# Patient Record
Sex: Female | Born: 1960 | Race: Black or African American | Hispanic: No | Smoking: Never smoker
Health system: Southern US, Community
[De-identification: ages and names within clinical notes are randomized; demographics above are authoritative.]

## PROBLEM LIST (undated history)

## (undated) DIAGNOSIS — H269 Unspecified cataract: Secondary | ICD-10-CM

## (undated) DIAGNOSIS — H35039 Hypertensive retinopathy, unspecified eye: Secondary | ICD-10-CM

## (undated) DIAGNOSIS — E119 Type 2 diabetes mellitus without complications: Secondary | ICD-10-CM

## (undated) HISTORY — DX: Unspecified cataract: H26.9

## (undated) HISTORY — DX: Hypertensive retinopathy, unspecified eye: H35.039

## (undated) HISTORY — DX: Type 2 diabetes mellitus without complications: E11.9

---

## 1998-11-14 ENCOUNTER — Other Ambulatory Visit: Admission: RE | Admit: 1998-11-14 | Discharge: 1998-11-14 | Payer: Self-pay | Admitting: Obstetrics and Gynecology

## 2000-01-02 ENCOUNTER — Other Ambulatory Visit: Admission: RE | Admit: 2000-01-02 | Discharge: 2000-01-02 | Payer: Self-pay | Admitting: Obstetrics and Gynecology

## 2001-02-16 ENCOUNTER — Other Ambulatory Visit: Admission: RE | Admit: 2001-02-16 | Discharge: 2001-02-16 | Payer: Self-pay | Admitting: Obstetrics and Gynecology

## 2001-05-27 HISTORY — PX: LAPAROSCOPIC TUBAL LIGATION: SUR803

## 2002-06-16 ENCOUNTER — Other Ambulatory Visit: Admission: RE | Admit: 2002-06-16 | Discharge: 2002-06-16 | Payer: Self-pay | Admitting: Obstetrics and Gynecology

## 2002-06-30 ENCOUNTER — Encounter: Payer: Self-pay | Admitting: Obstetrics and Gynecology

## 2002-06-30 ENCOUNTER — Encounter: Admission: RE | Admit: 2002-06-30 | Discharge: 2002-06-30 | Payer: Self-pay | Admitting: Obstetrics and Gynecology

## 2004-04-23 ENCOUNTER — Other Ambulatory Visit: Admission: RE | Admit: 2004-04-23 | Discharge: 2004-04-23 | Payer: Self-pay | Admitting: Obstetrics and Gynecology

## 2004-07-23 ENCOUNTER — Encounter: Admission: RE | Admit: 2004-07-23 | Discharge: 2004-07-23 | Payer: Self-pay | Admitting: Obstetrics and Gynecology

## 2004-08-17 ENCOUNTER — Encounter (INDEPENDENT_AMBULATORY_CARE_PROVIDER_SITE_OTHER): Payer: Self-pay | Admitting: *Deleted

## 2004-08-17 ENCOUNTER — Ambulatory Visit (HOSPITAL_COMMUNITY): Admission: RE | Admit: 2004-08-17 | Discharge: 2004-08-17 | Payer: Self-pay | Admitting: Obstetrics and Gynecology

## 2005-05-03 ENCOUNTER — Ambulatory Visit (HOSPITAL_COMMUNITY): Admission: RE | Admit: 2005-05-03 | Discharge: 2005-05-03 | Payer: Self-pay | Admitting: Gastroenterology

## 2005-05-06 ENCOUNTER — Ambulatory Visit (HOSPITAL_COMMUNITY): Admission: RE | Admit: 2005-05-06 | Discharge: 2005-05-06 | Payer: Self-pay | Admitting: Gastroenterology

## 2006-05-13 ENCOUNTER — Encounter (INDEPENDENT_AMBULATORY_CARE_PROVIDER_SITE_OTHER): Payer: Self-pay | Admitting: *Deleted

## 2006-05-13 ENCOUNTER — Ambulatory Visit (HOSPITAL_COMMUNITY): Admission: RE | Admit: 2006-05-13 | Discharge: 2006-05-13 | Payer: Self-pay | Admitting: Obstetrics and Gynecology

## 2006-07-14 ENCOUNTER — Encounter: Admission: RE | Admit: 2006-07-14 | Discharge: 2006-07-14 | Payer: Self-pay | Admitting: Obstetrics and Gynecology

## 2008-02-11 ENCOUNTER — Encounter: Admission: RE | Admit: 2008-02-11 | Discharge: 2008-02-11 | Payer: Self-pay | Admitting: Obstetrics and Gynecology

## 2010-06-16 ENCOUNTER — Encounter: Payer: Self-pay | Admitting: Gastroenterology

## 2010-06-16 ENCOUNTER — Encounter: Payer: Self-pay | Admitting: Obstetrics and Gynecology

## 2010-10-12 NOTE — H&P (Signed)
NAMELINEA, CALLES               ACCOUNT NO.:  [id number]   MEDICAL RECORD NO.:  [id number]          PATIENT TYPE:  AMB   LOCATION:  SDC                           FACILITY:  [facility]   PHYSICIAN:  Duke Salvia. Marcelle Overlie, M.D.DATE OF BIRTH:  1960/11/04   DATE OF ADMISSION:  DATE OF DISCHARGE:                              HISTORY & PHYSICAL   CHIEF COMPLAINT:  Request sterilization, menorrhagia.   HISTORY OF PRESENT ILLNESS:  A 50 year old G2, P2, underwent a SHG  recently that showed a small intramural fibroids and an endometrial  polyp.  She has had complaints of menorrhagia for several years and  presents now requesting sterilization, and D&C hysteroscopy with  ThermaChoice CMA.  This procedure including risk of bleeding, infection,  adjacent organ injury, the possible need for open or additional surgery,  all reviewed with her.  The permanence of the procedure, failure rate 2-  3 per thousand, all discussed which she understands and accepts.   Dictation Canceled At [employer].      Richard M. Marcelle Overlie, M.D.     RMH/MEDQ  D:  05/09/2006  T:  05/09/2006  Job:  [id number]

## 2010-10-12 NOTE — H&P (Signed)
Jacqueline Greer, Jacqueline Greer               ACCOUNT NO.:  [id number]   MEDICAL RECORD NO.:  [id number]          PATIENT TYPE:  AMB   LOCATION:  SDC                           [facility]ILITY:  [facility]   PHYSICIAN:  Duke Salvia. Marcelle Overlie, M.D.DATE OF BIRTH:  06/29/1960   DATE OF ADMISSION:  05/13/2006  DATE OF DISCHARGE:                              HISTORY & PHYSICAL   CHIEF COMPLAINT:  For permanent sterilization, heavy bleeding.   HISTORY OF PRESENT ILLNESS:  A 50 year old G2, P2 currently using  condoms for contraception was evaluated recently for menorrhagia, this  showed a well-defined endometrial polyp, also several small intramural  fibroids 1.1, 3.2, 1.9 and 1.1 cm with a 1.4 cm well-defined endometrial  polyp.  She presents now for tubal ligation and D&C hysteroscopy with  ThermaChoice EMA.  This procedure including the permanence of the tubal  procedure, failure rate of 2-3 per 1000, other risks related to  bleeding, infection, complications that may require open additional  surgery all discussed with her which she understands and accepts.   PAST MEDICAL HISTORY:   ALLERGIES:  NONE.   CURRENT MEDICATIONS:  None.   FAMILY HISTORY:  Family history significant for mother with diabetes.   SURGICAL HISTORY:  She has had a prior cesarean and one vaginal  delivery.   PHYSICAL EXAMINATION:  VITAL SIGNS:  Temperature 98.2, blood pressure  98/60.  HEENT:  Unremarkable.  NECK:  Supple without masses.  LUNGS:  Clear.  CARDIOVASCULAR:  Regular rate and rhythm without murmurs, rubs or  gallops noted.  BREASTS:  Without masses.  ABDOMEN:  Soft, flat, nontender.  PELVIC EXAM:  Normal external genitalia.  Vagina and cervix clear.  Uterus midpositional size.  Adnexa negative.  EXTREMITIES/NEUROLOGIC:  Exam unremarkable.   IMPRESSION:  1. Request sterilization.  2. Menorrhagia with endometrial polyp and small leiomyoma.   PLAN:  Filshie clip tubal ligation, D&C hysteroscopy with ThermaChoice  EMA  procedure and risks reviewed as above.      Richard M. Marcelle Overlie, M.D.  Electronically Signed     RMH/MEDQ  D:  04/26/2006  T:  04/26/2006  Job:  [id number]

## 2010-10-12 NOTE — Op Note (Signed)
NAMENEGIN, HEGG               ACCOUNT NO.:  [id number]   MEDICAL RECORD NO.:  [id number]          PATIENT TYPE:  AMB   LOCATION:  SDC                           [facility]ILITY[facility]  [facility]   PHYSICIAN:  Duke Salvia. Marcelle Overlie, M.D.DATE OF BIRTH:  1960-11-21   DATE OF PROCEDURE:  05/13/2006  DATE OF DISCHARGE:                               OPERATIVE REPORT   PREOPERATIVE DIAGNOSIS:  Request permanent sterilization, abnormal  uterine bleeding secondary to leiomyoma.   POSTOPERATIVE DIAGNOSIS:  Request permanent sterilization, abnormal  uterine bleeding secondary to leiomyoma.   PROCEDURE:  1. Laparoscopic tubal ligation by Filshie clip application.  2. D&C hysteroscopy.  3. ThermaChoice endometrial ablation.   SURGEON:  Dr. Marcelle Overlie.   ANESTHESIA:  General endotracheal.   COMPLICATIONS:  None.   SPECIMENS REMOVED:  Endometrial curettings.   PROCEDURE AND FINDINGS:  The patient was taken to the operating room.  After an adequate level of general endotracheal anesthesia was obtained  with the patient legs stirrups, the abdomen, perineum and vagina were  prepped and draped in usual manner for laparoscopy D&C. The bladder was  drained.  EUA carried out.  Uterus was upper limit of normal size,  slightly irregular.  Adnexa negative.  A Hulka tenaculum was positioned.  Attention directed to the abdomen.  The subumbilical incision was made  after infiltrating with half percent plain Marcaine.  Veress needle  introduced without difficulty.  Its intra-abdominal position verified by  pressure and water testing.  After a 3-liter pneumoperitoneum was then  created, laparoscopic trocar and sleeve were then introduced without  difficulty.  There was no evidence any bleeding or trauma.  The patient  was then placed in Trendelenburg.  The pelvic findings were unremarkable  except for some small irregularities in the uterus consistent with known  leiomyoma.  Half percent Marcaine 4 to 5 mL were then  dripped across  each tube from the cornu to the fimbriated end.  Filshie clip was then  applied at a right angle, 2 cm from the cornu.  After positively  identifying the tube, it was traced to the fimbriated end and applied 2  cm from the cornu with excellent application.  This was repeated on the  opposite side, again after carefully identifying the tube.  These areas  were hemostatic.  Instruments were removed.  Gas was allowed to escape.  The defect closed with 4-0 Dexon and subcuticular suture.  The legs were  extended.  Paracervical block was then created by infiltrating at 3 and  9 o'clock submucosally with 5 to 7 mL of 1% Xylocaine on either side  after negative aspiration.  Uterus sounded to 9 cm, progressively  dilated to a 27 Pratt.  D&C was then carried out.  Minimal tissue sent  to pathology.  Hysteroscope was then inserted.  The cavity was irrigated  and noted be  unremarkable except for the appearance of a submucosal portion of  fibroid at the fundus on the posterior wall.  Most of this appeared to  be intramural.  ThermaChoice EMA was then carried out per treatment  protocol without complication.  She tolerated this well and went to  recovery room in good condition.      Richard M. Marcelle Overlie, M.D.  Electronically Signed     RMH/MEDQ  D:  05/13/2006  T:  05/13/2006  Job:  [id number]

## 2010-10-12 NOTE — H&P (Signed)
Jacqueline Greer, Jacqueline Greer               ACCOUNT NO.:  [id number]   MEDICAL RECORD NO.:  [id number]          PATIENT TYPE:  AMB   LOCATION:  SDC                           [facility]ILITY:  [facility]   PHYSICIAN:  Duke Salvia. Marcelle Overlie, M.D.DATE OF BIRTH:  Feb 03, 1961   DATE OF ADMISSION:  08/17/2004  DATE OF DISCHARGE:                                HISTORY & PHYSICAL   CHIEF COMPLAINT:  Menorrhagia.   HISTORY OF PRESENT ILLNESS:  [age]-year-old G2, P2, not currently  sexually active, was previously on OCPs, but has not been on those recently.  Evaluated by our nurse practitioner in November of '05 with complaints of  menorrhagia and anemia, was started on iron at that point and [facility] was  scheduled.   SHG in our office, July 09, 2004, showed several fibroids, 1.2 and 9 mm,  several smaller, and on saline infusion, what appeared to be a polyp or a  small submucous fibroid was noted.  She presents at this time for [facility] and  hysteroscopy.  This procedure including risks of bleeding, infection, other  complications such as perforation that may require additional surgery are  all reviewed with her, which she understands and accepts.  Pap, March 28, 2004, was normal.   PAST MEDICAL HISTORY:   ALLERGIES:  None.   OPERATIONS:  Cesarean section in '89 followed by VBAC, not currently  sexually active.   CURRENT MEDICATIONS:  Iron.   FAMILY HISTORY:  Family history is significant for a history of diabetes.   PRENATAL LABORATORY DATA:  Blood type is O-positive.   PHYSICAL EXAM:  VITAL SIGNS:  Temperature 98.2, blood pressure 124/80.  HEENT:  Unremarkable.  NECK:  Neck supple without masses.  LUNGS:  Clear.  CARDIOVASCULAR:  Regular rate and rhythm without murmurs, rubs, or gallops  noted.  BREASTS:  Breasts without masses.  ABDOMEN:  Abdomen soft, flat and nontender.  PELVIC:  Normal external genitalia.  Vagina and cervix are clear.  Uterus is  upper limit of normal size, mobile.  Adnexa  negative.  EXTREMITIES:  Unremarkable.  NEUROLOGIC:  Unremarkable.   IMPRESSION:  1.  Menorrhagia with mild anemia.  2.  Endometrial polyp versus submucous fibroid.   PLAN:  D&C/hysteroscopy, procedure and risks reviewed as above.      RMH/MEDQ  D:  08/14/2004  T:  08/14/2004  Job:  [id number]

## 2010-10-12 NOTE — Op Note (Signed)
Jacqueline Greer, Jacqueline Greer               ACCOUNT NO.:  [id number]   MEDICAL RECORD NO.:  [id number]          PATIENT TYPE:  AMB   LOCATION:  SDC                           [facility]ILITY:  [facility]   PHYSICIAN:  Duke Salvia. Marcelle Overlie, M.D.DATE OF BIRTH:  01/19/1961   DATE OF PROCEDURE:  08/17/2004  DATE OF DISCHARGE:                                 OPERATIVE REPORT   PREOPERATIVE DIAGNOSES:  1.  Abnormal uterine bleeding.  2.  Endometrial polyp versus submucous fibroid.   POSTOPERATIVE DIAGNOSES:  1.  Submucous fibroid.  2.  Abnormal uterine bleeding.   PROCEDURE:  Dilatation and curettage, hysteroscopy, removal of a submucous  fibroid.   SURGEON:  Duke Salvia. Marcelle Overlie, M.D.   ANESTHESIA:  Monitored sedation plus paracervical block.   ESTIMATED BLOOD LOSS:  5-10 mL.   COMPLICATIONS:  None.   PROCEDURE AND FINDINGS:  The patient was taken to the operating room.  After  an adequate sedation was obtained with the patient's legs in stirrups, the  perineum and vagina were prepped and draped with Betadine, the bladder was  drained, a UA carried out.  The uterus midposition, normal size, adnexa  negative.  The speculum was positioned, cervix grasped with a tenaculum.  A  paracervical block was created by infiltrating at 3 and 9 o'clock  submucosally.  Xylocaine 1% 5-7 mL on either side after negative aspiration.  The uterus was then sounded to 9 cm, was progressively dilated to a 27-29  Pratt dilator.  A 7 mm continuous-flow hysteroscope was inserted.  A large  amount of tissue was noted, so initial D&C was carried out and sent to  pathology.  After this was completed, the scope was reinserted, better  visualization was noted.  What appeared to be a fundal submucous fibroid was  noted.  A polyp forceps was then used to grasp the fibroid, and it was  avulsed.  When it was removed in toto, it appeared to be 1.5-2 cm across.  After this was removed, there was minimal bleeding and the scope was  reinserted.  The cavity was clean.  This was observed over time with reduced  pressure.  No bleeding was noted.  The instruments were removed and she went  to the recovery room in good condition.  She did receive Toradol IV in the  operating room.      RMH/MEDQ  D:  08/17/2004  T:  08/17/2004  Job:  [id number]

## 2011-04-10 ENCOUNTER — Encounter: Payer: Self-pay | Admitting: Emergency Medicine

## 2011-04-10 ENCOUNTER — Emergency Department (INDEPENDENT_AMBULATORY_CARE_PROVIDER_SITE_OTHER): Payer: 59

## 2011-04-10 ENCOUNTER — Emergency Department (HOSPITAL_COMMUNITY)
Admission: EM | Admit: 2011-04-10 | Discharge: 2011-04-10 | Disposition: A | Discharge status: Home / Self Care | Payer: 59 | Source: Home / Self Care | Attending: Family Medicine | Admitting: Family Medicine

## 2011-04-10 DIAGNOSIS — K5900 Constipation, unspecified: Secondary | ICD-10-CM

## 2011-04-10 LAB — POCT URINALYSIS DIP (DEVICE)
Glucose, UA: NEGATIVE mg/dL
Urobilinogen, UA: 1 mg/dL (ref 0.0–1.0)

## 2011-04-10 MED ORDER — ONDANSETRON HCL 4 MG PO TABS: 4.0000 mg | ORAL_TABLET | Freq: Three times a day (TID) | ORAL | Status: AC | PRN

## 2011-04-10 NOTE — ED Notes (Signed)
Pt here with poss constipation and sx mid abd cramping pain radiating to left lower back.pt reports  to sx starting on Monday.pt last bm last Wednesday and x 1 episode of vomiting last night.no fevers associated with sx

## 2011-04-10 NOTE — ED Provider Notes (Signed)
History     CSN: [id number] Arrival date & time: 04/10/2011  9:00 AM   First MD Initiated Contact with Patient 04/10/11 1028      Chief Complaint  Patient presents with  . Constipation  . Abdominal Cramping    (Consider location/radiation/quality/duration/timing/severity/associated sxs/prior treatment) Patient is a 50 y.o. female presenting with abdominal pain. The history is provided by the patient.  Abdominal Pain The primary symptoms of the illness include abdominal pain, fever, nausea and vomiting. The primary symptoms of the illness do not include diarrhea. The current episode started more than 2 days ago. The onset of the illness was sudden. The problem has not changed since onset. The abdominal pain began more than 2 days ago. The pain came on suddenly. The abdominal pain has been unchanged since its onset. The abdominal pain is generalized. The abdominal pain radiates to the left flank.  Nausea began 3 to 5 days ago.  The vomiting began more than 2 days ago. Vomiting occurred once. The emesis contains stomach contents.  Additional symptoms associated with the illness include chills, constipation and back pain.    History reviewed. No pertinent past medical history.  History reviewed. No pertinent past surgical history.  Family History  Problem Relation Age of Onset  . Diabetes Other     History  Substance Use Topics  . Smoking status: Never Smoker   . Smokeless tobacco: Not on file  . Alcohol Use: No    OB History    Grav Para Term Preterm Abortions TAB SAB Ect Mult Living                  Review of Systems  Constitutional: Positive for fever and chills.  HENT: Negative.   Eyes: Negative.   Respiratory: Negative.   Gastrointestinal: Positive for nausea, vomiting, abdominal pain and constipation. Negative for diarrhea.  Genitourinary: Negative.   Musculoskeletal: Positive for back pain.  Skin: Negative.     Allergies  Review of patient's allergies  indicates not on file.  Home Medications  No current outpatient prescriptions on file.  BP 142/89  Pulse 86  Temp(Src) 98.7 F (37.1 C) (Oral)  Resp 22  SpO2 100%  Physical Exam  Constitutional: She is oriented to person, place, and time. She appears well-developed and well-nourished.  HENT:  Head: Normocephalic and atraumatic.  Eyes: EOM are normal.  Neck: Normal range of motion.  Pulmonary/Chest: Breath sounds normal.  Abdominal: Soft. Normal appearance and bowel sounds are normal. There is tenderness in the right upper quadrant, right lower quadrant, epigastric area, periumbilical area and left upper quadrant. There is no rebound, no tenderness at McBurney's point and negative Murphy's sign.       Reports 5/10 pain diffusely  Neurological: She is alert and oriented to person, place, and time.  Skin: Skin is warm and dry.    ED Course  Procedures (including critical care time)  Labs Reviewed - No data to display No results found.   No diagnosis found.    MDM  KUB: moderate amount of feces        Richardo Priest, MD 04/10/11 1135

## 2011-04-11 ENCOUNTER — Observation Stay (HOSPITAL_COMMUNITY)
Admission: EM | Admit: 2011-04-11 | Discharge: 2011-04-12 | Disposition: A | Discharge status: Home / Self Care | Payer: 59 | Attending: Emergency Medicine | Admitting: Emergency Medicine

## 2011-04-11 ENCOUNTER — Encounter (HOSPITAL_COMMUNITY): Payer: Self-pay | Admitting: Emergency Medicine

## 2011-04-11 DIAGNOSIS — N390 Urinary tract infection, site not specified: Secondary | ICD-10-CM

## 2011-04-11 DIAGNOSIS — R1013 Epigastric pain: Secondary | ICD-10-CM | POA: Insufficient documentation

## 2011-04-11 DIAGNOSIS — R1110 Vomiting, unspecified: Secondary | ICD-10-CM | POA: Insufficient documentation

## 2011-04-11 DIAGNOSIS — R1011 Right upper quadrant pain: Principal | ICD-10-CM | POA: Insufficient documentation

## 2011-04-11 LAB — HEPATIC FUNCTION PANEL
Albumin: 4.3 g/dL (ref 3.5–5.2)
Alkaline Phosphatase: 79 U/L (ref 39–117)
Indirect Bilirubin: 0.9 mg/dL (ref 0.3–0.9)
Total Bilirubin: 1.2 mg/dL (ref 0.3–1.2)
Total Protein: 8.4 g/dL — ABNORMAL HIGH (ref 6.0–8.3)

## 2011-04-11 LAB — BASIC METABOLIC PANEL
CO2: 29 mEq/L (ref 19–32)
Calcium: 10.3 mg/dL (ref 8.4–10.5)
Creatinine, Ser: 0.62 mg/dL (ref 0.50–1.10)
GFR calc Af Amer: >90 mL/min (ref >90)
GFR calc non Af Amer: >90 mL/min (ref >90)
Glucose, Bld: 133 mg/dL — ABNORMAL HIGH (ref 70–99)
Sodium: 141 mEq/L (ref 135–145)

## 2011-04-11 LAB — URINALYSIS, ROUTINE W REFLEX MICROSCOPIC
Ketones, ur: >80 mg/dL — AB
Protein, ur: 30 mg/dL — AB
pH: 5 (ref 5.0–8.0)

## 2011-04-11 LAB — CBC
HCT: 40.7 % (ref 36.0–46.0)
MCH: 32.3 pg (ref 26.0–34.0)
MCHC: 33.7 g/dL (ref 30.0–36.0)
MCV: 96 fL (ref 78.0–100.0)
RBC: 4.24 MIL/uL (ref 3.87–5.11)
RDW: 12.9 % (ref 11.5–15.5)

## 2011-04-11 LAB — DIFFERENTIAL
Basophils Relative: 0 % (ref 0–1)
Lymphs Abs: 1 10*3/uL (ref 0.7–4.0)
Monocytes Relative: 5 % (ref 3–12)
Neutro Abs: 8.5 10*3/uL — ABNORMAL HIGH (ref 1.7–7.7)
Neutrophils Relative %: 85 % — ABNORMAL HIGH (ref 43–77)

## 2011-04-11 LAB — URINE MICROSCOPIC-ADD ON

## 2011-04-11 LAB — LIPASE, BLOOD: Lipase: 34 U/L (ref 11–59)

## 2011-04-11 MED ORDER — SODIUM CHLORIDE 0.9 % IV SOLN
Freq: Once | INTRAVENOUS | Status: AC
  Administered 2011-04-11 – 2011-04-12 (×2): via INTRAVENOUS

## 2011-04-11 MED ORDER — SODIUM CHLORIDE 0.9 % IV BOLUS (SEPSIS)
500.0000 mL | Freq: Once | INTRAVENOUS | Status: AC
  Administered 2011-04-11: 1000 mL via INTRAVENOUS

## 2011-04-11 MED ORDER — ONDANSETRON HCL 4 MG/2ML IJ SOLN
4.0000 mg | Freq: Once | INTRAMUSCULAR | Status: AC
  Administered 2011-04-11: 4 mg via INTRAVENOUS
  Filled 2011-04-11: qty 2

## 2011-04-11 MED ORDER — MORPHINE SULFATE 4 MG/ML IJ SOLN
4.0000 mg | Freq: Once | INTRAMUSCULAR | Status: AC
  Administered 2011-04-11: 4 mg via INTRAVENOUS
  Filled 2011-04-11: qty 1

## 2011-04-11 NOTE — ED Provider Notes (Signed)
History     CSN: [id number] Arrival date & time: 04/11/2011  8:42 PM   First MD Initiated Contact with Patient 04/11/11 2243      Chief Complaint  Patient presents with  . Emesis    (Consider location/radiation/quality/duration/timing/severity/associated sxs/prior treatment) Patient is a 50 y.o. female presenting with vomiting. The history is provided by the patient.  Emesis  The current episode started more than 2 days ago. The problem occurs continuously. The problem has been gradually worsening. The emesis has an appearance of stomach contents. Associated symptoms include abdominal pain, chills and myalgias. Pertinent negatives include no cough, no diarrhea and no URI.  Pt states she has had abdominal pain for last 4 days. Went to UC yesterday, states was told she was constipated. Pt went home with nausea medications and maalox. States since then has been vomiting. Unable to keep anything down. Denies diarrhea, last bm today, normal. No hx of the same.   History reviewed. No pertinent past medical history.  History reviewed. No pertinent past surgical history.  Family History  Problem Relation Age of Onset  . Diabetes Other     History  Substance Use Topics  . Smoking status: Never Smoker   . Smokeless tobacco: Not on file  . Alcohol Use: No    OB History    Grav Para Term Preterm Abortions TAB SAB Ect Mult Living                  Review of Systems  Constitutional: Positive for chills.  Respiratory: Negative for cough.   Gastrointestinal: Positive for vomiting and abdominal pain. Negative for diarrhea.  Musculoskeletal: Positive for myalgias.    Allergies  Review of patient's allergies indicates no known allergies.  Home Medications   Current Outpatient Rx  Name Route Sig Dispense Refill  . ONDANSETRON HCL 4 MG PO TABS Oral Take 1 tablet (4 mg total) by mouth every 8 (eight) hours as needed for nausea. 15 tablet 0    BP 131/78  Pulse 86  Resp 18  SpO2  100%  Physical Exam  Constitutional: She is oriented to person, place, and time. She appears well-developed and well-nourished. No distress.  HENT:  Head: Normocephalic.  Cardiovascular: Normal rate, regular rhythm and normal heart sounds.   Pulmonary/Chest: Effort normal and breath sounds normal. No respiratory distress.  Abdominal: Soft. Bowel sounds are normal.       Right upper quadrant, epigastric, and right lower quadrant tenderness. No guarding, no rebound tenderness.  Genitourinary:       No CVA tenderness  Musculoskeletal: Normal range of motion.  Neurological: She is alert and oriented to person, place, and time.  Skin: Skin is warm and dry.  Psychiatric: She has a normal mood and affect.    ED Course  Procedures (including critical care time)  Labs Reviewed  DIFFERENTIAL - Abnormal; Notable for the following:    Neutrophils Relative 85 (*)    Neutro Abs 8.5 (*)    Lymphocytes Relative 10 (*)    All other components within normal limits  BASIC METABOLIC PANEL - Abnormal; Notable for the following:    Glucose, Bld 133 (*)    All other components within normal limits  CBC  URINALYSIS, ROUTINE W REFLEX MICROSCOPIC  CBC  COMPREHENSIVE METABOLIC PANEL  LIPASE, BLOOD   Dg Abd 1 View  04/10/2011  *RADIOLOGY REPORT*  Clinical Data: Generalized abdominal pain for 3 days, vomiting  ABDOMEN - 1 VIEW  Comparison: Abdomen film of  05/04/1999 seen  Findings: There is a moderate amount of feces throughout the colon. No bowel obstruction is seen.  No opaque calculi noted.  Tubal ligation clips are present in the pelvis.  IMPRESSION: Moderate amount of feces throughout the colon.  No bowel obstruction.  Original Report Authenticated By: Juline Patch, M.D.     12:03 AM Elevated LFTs. Pain improved with iv morphine, nausea improved with iv zofran. Pt continues to have abdominal tenderness. Will get CT abd/pelvis for further evaluation   Pt signed out to PA paz at the end of the  shift, CT abd/pelvis pending. Pain under control. MDM          Lottie Mussel, PA 04/16/11 0120

## 2011-04-11 NOTE — ED Notes (Signed)
Pt medicated per order and provided with a pillow and blanket.

## 2011-04-11 NOTE — ED Notes (Signed)
PT. REPORTS INTERMITTENT VOMITTING  X 4 DAYS , DENIES DIARRHEA , GENERALIZED WEAKNESS " TIRED/DRAINED" ,  NO FEVER OR CHILLS.

## 2011-04-12 ENCOUNTER — Other Ambulatory Visit: Payer: Self-pay

## 2011-04-12 ENCOUNTER — Emergency Department (HOSPITAL_COMMUNITY): Payer: 59

## 2011-04-12 MED ORDER — ONDANSETRON HCL 4 MG/2ML IJ SOLN
4.0000 mg | Freq: Once | INTRAMUSCULAR | Status: AC
  Administered 2011-04-12: 4 mg via INTRAVENOUS

## 2011-04-12 MED ORDER — OXYCODONE-ACETAMINOPHEN 5-325 MG PO TABS: 1.0000 | ORAL_TABLET | Freq: Four times a day (QID) | ORAL | Status: AC | PRN

## 2011-04-12 MED ORDER — MORPHINE SULFATE 4 MG/ML IJ SOLN
4.0000 mg | Freq: Once | INTRAMUSCULAR | Status: AC
  Administered 2011-04-12: 4 mg via INTRAVENOUS

## 2011-04-12 MED ORDER — MORPHINE SULFATE 4 MG/ML IJ SOLN
4.0000 mg | Freq: Once | INTRAMUSCULAR | Status: AC
  Administered 2011-04-12: 4 mg via INTRAVENOUS
  Filled 2011-04-12: qty 1

## 2011-04-12 MED ORDER — OXYCODONE-ACETAMINOPHEN 5-325 MG PO TABS
2.0000 | ORAL_TABLET | Freq: Once | ORAL | Status: AC
  Administered 2011-04-12: 2 via ORAL
  Filled 2011-04-12: qty 2

## 2011-04-12 MED ORDER — NITROFURANTOIN MONOHYD MACRO 100 MG PO CAPS: 100.0000 mg | ORAL_CAPSULE | Freq: Two times a day (BID) | ORAL | Status: AC

## 2011-04-12 MED ORDER — SODIUM CHLORIDE 0.9 % IV BOLUS (SEPSIS)
1000.0000 mL | Freq: Once | INTRAVENOUS | Status: AC
  Administered 2011-04-12: 1000 mL via INTRAVENOUS

## 2011-04-12 MED ORDER — MORPHINE SULFATE 4 MG/ML IJ SOLN
INTRAMUSCULAR | Status: AC
  Filled 2011-04-12: qty 1

## 2011-04-12 MED ORDER — ONDANSETRON HCL 4 MG/2ML IJ SOLN
4.0000 mg | Freq: Once | INTRAMUSCULAR | Status: AC
  Administered 2011-04-12: 4 mg via INTRAVENOUS
  Filled 2011-04-12: qty 2

## 2011-04-12 MED ORDER — ONDANSETRON 4 MG PO TBDP
8.0000 mg | ORAL_TABLET | Freq: Once | ORAL | Status: AC
  Administered 2011-04-12: 8 mg via ORAL
  Filled 2011-04-12: qty 2

## 2011-04-12 MED ORDER — ONDANSETRON 8 MG PO TBDP: 8.0000 mg | ORAL_TABLET | Freq: Three times a day (TID) | ORAL | Status: AC | PRN

## 2011-04-12 MED ORDER — ONDANSETRON HCL 4 MG/2ML IJ SOLN
INTRAMUSCULAR | Status: AC
  Filled 2011-04-12: qty 2

## 2011-04-12 MED ORDER — IOHEXOL 300 MG/ML  SOLN
100.0000 mL | Freq: Once | INTRAMUSCULAR | Status: AC | PRN
  Administered 2011-04-12: 100 mL via INTRAVENOUS

## 2011-04-12 NOTE — ED Notes (Signed)
Pt resting quietly with eyes closed, resp equal and nonlabored.  NAD

## 2011-04-12 NOTE — ED Notes (Signed)
Pt found actively vomitting and c/o pain getting worse.  PA notified for new orders.

## 2011-04-12 NOTE — ED Notes (Signed)
Report received from [facility], [facility] fund[facility].  Pt in room, no pain at this time, updated on POC (waiting for ultrasound).

## 2011-04-12 NOTE — ED Provider Notes (Signed)
History     CSN: [id number] Arrival date & time: 04/11/2011  8:42 PM   First MD Initiated Contact with Patient 04/11/11 2243      Chief Complaint  Patient presents with  . Emesis    (Consider location/radiation/quality/duration/timing/severity/associated sxs/prior treatment) HPI  History reviewed. No pertinent past medical history.  History reviewed. No pertinent past surgical history.  Family History  Problem Relation Age of Onset  . Diabetes Other     History  Substance Use Topics  . Smoking status: Never Smoker   . Smokeless tobacco: Not on file  . Alcohol Use: No    OB History    Grav Para Term Preterm Abortions TAB SAB Ect Mult Living                  Review of Systems  Allergies  Review of patient's allergies indicates no known allergies.  Home Medications   Current Outpatient Rx  Name Route Sig Dispense Refill  . ONDANSETRON HCL 4 MG PO TABS Oral Take 1 tablet (4 mg total) by mouth every 8 (eight) hours as needed for nausea. 15 tablet 0    BP 121/68  Pulse 83  Resp 17  Ht 5\' 7"  (1.702 m)  Wt 180 lb (81.647 kg)  BMI 28.19 kg/m2  SpO2 98%  Physical Exam  ED Course  Procedures (including critical care time)  Labs Reviewed  DIFFERENTIAL - Abnormal; Notable for the following:    Neutrophils Relative 85 (*)    Neutro Abs 8.5 (*)    Lymphocytes Relative 10 (*)    All other components within normal limits  BASIC METABOLIC PANEL - Abnormal; Notable for the following:    Glucose, Bld 133 (*)    All other components within normal limits  URINALYSIS, ROUTINE W REFLEX MICROSCOPIC - Abnormal; Notable for the following:    Color, Urine ORANGE (*) BIOCHEMICALS MAY BE AFFECTED BY COLOR   Appearance CLOUDY (*)    Hgb urine dipstick MODERATE (*)    Bilirubin Urine MODERATE (*)    Ketones, ur >80 (*)    Protein, ur 30 (*)    Nitrite POSITIVE (*)    All other components within normal limits  HEPATIC FUNCTION PANEL - Abnormal; Notable for the  following:    Total Protein 8.4 (*)    AST 58 (*)    ALT 79 (*)    All other components within normal limits  URINE MICROSCOPIC-ADD ON - Abnormal; Notable for the following:    Squamous Epithelial / LPF FEW (*)    All other components within normal limits  CBC  LIPASE, BLOOD   Dg Abd 1 View  04/10/2011  *RADIOLOGY REPORT*  Clinical Data: Generalized abdominal pain for 3 days, vomiting  ABDOMEN - 1 VIEW  Comparison: Abdomen film of 05/04/1999 seen  Findings: There is a moderate amount of feces throughout the colon. No bowel obstruction is seen.  No opaque calculi noted.  Tubal ligation clips are present in the pelvis.  IMPRESSION: Moderate amount of feces throughout the colon.  No bowel obstruction.  Original Report Authenticated By: Juline Patch, M.D.   US Abdomen Complete  04/12/2011  *RADIOLOGY REPORT*  Clinical Data:  Right upper quadrant abdominal pain and emesis.  ABDOMINAL ULTRASOUND COMPLETE  Comparison:  CT of the abdomen and pelvis performed earlier today at 03:52 a.m., and abdominal ultrasound performed 05/03/2005  Findings:  Gallbladder:  The gallbladder is normal in appearance, without evidence for gallstones, gallbladder wall thickening or  pericholecystic fluid.  No ultrasonographic Murphy's sign is elicited.  Common Bile Duct:  0.4 cm in diameter; within normal limits in caliber.  Liver:  Diffusely increased hepatic echogenicity and coarsened echotexture, compatible with fatty infiltration; a small amount of sparing is noted adjacent to the gallbladder fossa.  The hepatic cysts characterized on CT are visualized on ultrasound, measuring 0.9 cm in size adjacent to the gallbladder fossa, and 0.8 cm at the left hepatic lobe.  Limited Doppler evaluation demonstrates normal blood flow within the liver.  IVC:  Unremarkable in appearance.  Pancreas:  Although the pancreas is difficult to visualize in its entirety due to overlying bowel gas, no focal pancreatic abnormality is identified.   Spleen:  8.4 cm in length; within normal limits in size and echotexture.  Right kidney:  11.3 cm in length; normal in size, configuration and parenchymal echogenicity.  No evidence of hydronephrosis.  A 0.9 cm cyst is seen at the interpole region of the right kidney, as previously noted.  Left kidney:  10.7 cm in length; normal in size, configuration and parenchymal echogenicity.  No evidence of mass or hydronephrosis.  Abdominal Aorta:  Normal in caliber; no aneurysm identified.  IMPRESSION:  1.  No acute abnormalities identified within the abdomen. 2.  Diffuse fatty infiltration within the liver. 3.  Small hepatic and right renal cysts again noted.  Original Report Authenticated By: Tonia Ghent, M.D.   Ct Abdomen Pelvis W Contrast  04/12/2011  *RADIOLOGY REPORT*  Clinical Data: Abdominal pain and vomiting; generalized weakness.  CT ABDOMEN AND PELVIS WITH CONTRAST  Technique:  Multidetector CT imaging of the abdomen and pelvis was performed following the standard protocol during bolus administration of intravenous contrast.  Contrast: OMNIPAQUE IOHEXOL 300 MG/ML IV SOLN  Comparison: Abdominal ultrasound performed 05/03/2005, and abdominal radiograph performed 04/10/2011  Findings: The visualized lung bases are clear.  Small hypodensities are noted within both hepatic lobes, measuring up to 1.1 cm in size and likely reflecting small hepatic cyst.  The liver is otherwise unremarkable in appearance.  The spleen is mildly diminutive but unremarkable.  The gallbladder demonstrates a mildly unusual configuration but remains within normal limits.  The pancreas and adrenal glands are unremarkable.  A small 6 mm hypodensity along the periphery of the right kidney appears to reflect a stable cyst, on correlation with prior ultrasound.  Additional smaller cysts are noted bilaterally.  There is no evidence of hydronephrosis.  No renal or ureteral stones are seen.  No significant perinephric stranding is  appreciated.  No free fluid is identified.  The small bowel is unremarkable in appearance.  The stomach is within normal limits.  No acute vascular abnormalities are seen.  The appendix is normal in caliber, without evidence for appendicitis.  Contrast progresses to the level of the mid transverse colon.  The colon is partially filled with stool, and is unremarkable in appearance.  The bladder is mildly distended and within normal limits.  A small calcification at the uterine fundus may reflect a small calcified fibroid.  The patient is status post bilateral tubal ligation.  The ovaries are grossly symmetric in appearance; no suspicious adnexal masses are seen.  No inguinal lymphadenopathy is seen.  No acute osseous abnormalities are identified.  IMPRESSION:  1.  No acute abnormalities identified within the abdomen or pelvis. 2.  Small hepatic and renal cysts again seen. 3.  Calcification at the fundus of the uterus may reflect a small calcified fibroid.  Original Report Authenticated By:  JEFFREY Cherly Hensen, M.D.     No diagnosis found.    MDM  Report received from 3rd shift PA .  Patient awaiting u/s of the abdomen for RUQ and epigastric pain. Report given to [facility]        Jethro Bastos, NP 04/12/11 [phone]

## 2011-04-12 NOTE — ED Provider Notes (Signed)
Medical screening examination/treatment/procedure(s) were performed by non-physician practitioner and as supervising physician I was immediately available for consultation/collaboration.   Aariv Medlock M Dymphna Wadley, DO 04/12/11 2135 

## 2011-04-12 NOTE — ED Provider Notes (Signed)
History     CSN: [id number] Arrival date & time: 04/11/2011  8:42 PM   First MD Initiated Contact with Patient 04/11/11 2243      Chief Complaint  Patient presents with  . Emesis    (Consider location/radiation/quality/duration/timing/severity/associated sxs/prior treatment) HPI  History reviewed. No pertinent past medical history.  History reviewed. No pertinent past surgical history.  Family History  Problem Relation Age of Onset  . Diabetes Other     History  Substance Use Topics  . Smoking status: Never Smoker   . Smokeless tobacco: Not on file  . Alcohol Use: No    OB History    Grav Para Term Preterm Abortions TAB SAB Ect Mult Living                  Review of Systems  Allergies  Review of patient's allergies indicates no known allergies.  Home Medications   Current Outpatient Rx  Name Route Sig Dispense Refill  . ONDANSETRON HCL 4 MG PO TABS Oral Take 1 tablet (4 mg total) by mouth every 8 (eight) hours as needed for nausea. 15 tablet 0    BP 131/70  Pulse 80  Resp 20  Ht 5\' 7"  (1.702 m)  Wt 180 lb (81.647 kg)  BMI 28.19 kg/m2  SpO2 100%  Physical Exam  ED Course  Procedures (including critical care time)  Labs Reviewed  DIFFERENTIAL - Abnormal; Notable for the following:    Neutrophils Relative 85 (*)    Neutro Abs 8.5 (*)    Lymphocytes Relative 10 (*)    All other components within normal limits  BASIC METABOLIC PANEL - Abnormal; Notable for the following:    Glucose, Bld 133 (*)    All other components within normal limits  URINALYSIS, ROUTINE W REFLEX MICROSCOPIC - Abnormal; Notable for the following:    Color, Urine ORANGE (*) BIOCHEMICALS MAY BE AFFECTED BY COLOR   Appearance CLOUDY (*)    Hgb urine dipstick MODERATE (*)    Bilirubin Urine MODERATE (*)    Ketones, ur >80 (*)    Protein, ur 30 (*)    Nitrite POSITIVE (*)    All other components within normal limits  HEPATIC FUNCTION PANEL - Abnormal; Notable for the  following:    Total Protein 8.4 (*)    AST 58 (*)    ALT 79 (*)    All other components within normal limits  URINE MICROSCOPIC-ADD ON - Abnormal; Notable for the following:    Squamous Epithelial / LPF FEW (*)    All other components within normal limits  CBC  LIPASE, BLOOD  URINE CULTURE   Dg Abd 1 View  04/10/2011  *RADIOLOGY REPORT*  Clinical Data: Generalized abdominal pain for 3 days, vomiting  ABDOMEN - 1 VIEW  Comparison: Abdomen film of 05/04/1999 seen  Findings: There is a moderate amount of feces throughout the colon. No bowel obstruction is seen.  No opaque calculi noted.  Tubal ligation clips are present in the pelvis.  IMPRESSION: Moderate amount of feces throughout the colon.  No bowel obstruction.  Original Report Authenticated By: Juline Patch, M.D.   US Abdomen Complete  04/12/2011  *RADIOLOGY REPORT*  Clinical Data:  Right upper quadrant abdominal pain and emesis.  ABDOMINAL ULTRASOUND COMPLETE  Comparison:  CT of the abdomen and pelvis performed earlier today at 03:52 a.m., and abdominal ultrasound performed 05/03/2005  Findings:  Gallbladder:  The gallbladder is normal in appearance, without evidence for gallstones, gallbladder  wall thickening or pericholecystic fluid.  No ultrasonographic Murphy's sign is elicited.  Common Bile Duct:  0.4 cm in diameter; within normal limits in caliber.  Liver:  Diffusely increased hepatic echogenicity and coarsened echotexture, compatible with fatty infiltration; a small amount of sparing is noted adjacent to the gallbladder fossa.  The hepatic cysts characterized on CT are visualized on ultrasound, measuring 0.9 cm in size adjacent to the gallbladder fossa, and 0.8 cm at the left hepatic lobe.  Limited Doppler evaluation demonstrates normal blood flow within the liver.  IVC:  Unremarkable in appearance.  Pancreas:  Although the pancreas is difficult to visualize in its entirety due to overlying bowel gas, no focal pancreatic abnormality is  identified.  Spleen:  8.4 cm in length; within normal limits in size and echotexture.  Right kidney:  11.3 cm in length; normal in size, configuration and parenchymal echogenicity.  No evidence of hydronephrosis.  A 0.9 cm cyst is seen at the interpole region of the right kidney, as previously noted.  Left kidney:  10.7 cm in length; normal in size, configuration and parenchymal echogenicity.  No evidence of mass or hydronephrosis.  Abdominal Aorta:  Normal in caliber; no aneurysm identified.  IMPRESSION:  1.  No acute abnormalities identified within the abdomen. 2.  Diffuse fatty infiltration within the liver. 3.  Small hepatic and right renal cysts again noted.  Original Report Authenticated By: Tonia Ghent, M.D.   Ct Abdomen Pelvis W Contrast  04/12/2011  *RADIOLOGY REPORT*  Clinical Data: Abdominal pain and vomiting; generalized weakness.  CT ABDOMEN AND PELVIS WITH CONTRAST  Technique:  Multidetector CT imaging of the abdomen and pelvis was performed following the standard protocol during bolus administration of intravenous contrast.  Contrast: OMNIPAQUE IOHEXOL 300 MG/ML IV SOLN  Comparison: Abdominal ultrasound performed 05/03/2005, and abdominal radiograph performed 04/10/2011  Findings: The visualized lung bases are clear.  Small hypodensities are noted within both hepatic lobes, measuring up to 1.1 cm in size and likely reflecting small hepatic cyst.  The liver is otherwise unremarkable in appearance.  The spleen is mildly diminutive but unremarkable.  The gallbladder demonstrates a mildly unusual configuration but remains within normal limits.  The pancreas and adrenal glands are unremarkable.  A small 6 mm hypodensity along the periphery of the right kidney appears to reflect a stable cyst, on correlation with prior ultrasound.  Additional smaller cysts are noted bilaterally.  There is no evidence of hydronephrosis.  No renal or ureteral stones are seen.  No significant perinephric stranding  is appreciated.  No free fluid is identified.  The small bowel is unremarkable in appearance.  The stomach is within normal limits.  No acute vascular abnormalities are seen.  The appendix is normal in caliber, without evidence for appendicitis.  Contrast progresses to the level of the mid transverse colon.  The colon is partially filled with stool, and is unremarkable in appearance.  The bladder is mildly distended and within normal limits.  A small calcification at the uterine fundus may reflect a small calcified fibroid.  The patient is status post bilateral tubal ligation.  The ovaries are grossly symmetric in appearance; no suspicious adnexal masses are seen.  No inguinal lymphadenopathy is seen.  No acute osseous abnormalities are identified.  IMPRESSION:  1.  No acute abnormalities identified within the abdomen or pelvis. 2.  Small hepatic and renal cysts again seen. 3.  Calcification at the fundus of the uterus may reflect a small calcified fibroid.  Original  Report Authenticated By: Tonia Ghent, M.D.     1. Abdominal pain    8:20 AM Hand off from Remi Haggard NP.  Patient seen and examined. Patient with intermittent vomiting for several days. She has had a negative CT and unconcerning labs overnight.  She was moved to the CDU pending an abdominal ultrasound which has resulted and is negative. Will attempt to transition to by mouth pain medicine. She has been sipping on liquids in the room without vomiting.  Patient was improved in the emergency department. She is tolerating PO's. Her pain is controlled. She is stable for discharge home. Patient was discharged home with medicine for pain, nausea, and urinary tract infection. She was urged to return with worsening symptoms, fever, persistent vomiting, bloody stools, or any other concerns. Patient verbalizes understanding and agrees with plan.  Patient counseled on use of narcotic pain medications. Counseled not to combine these medications with  others containing tylenol. Urged not to drink alcohol, drive, or perform any other activities that requires focus while taking these medications. The patient verbalizes understanding and agrees with the plan.   MDM  Patient with abdominal pain of unclear etiology. Blood test, CT scan, ultrasound are not suggestive of any acute or dangerous conditions. Patient is tolerating fluids in the emergency department. Her pain is controlled. She is stable for discharge home and primary care followup.        Eustace Moore [place], [place] 04/12/11 1659

## 2011-04-12 NOTE — ED Notes (Signed)
pt c/o mild nausea and pain at 3/10. VSS

## 2011-04-12 NOTE — ED Notes (Signed)
Pt finished with PO contrast, denies N/V at this time.  Lungs CTA, VSS

## 2011-04-12 NOTE — ED Notes (Signed)
Pt report given and care endorsed to Kelly Moon, RN. 

## 2011-04-13 NOTE — ED Provider Notes (Signed)
Medical screening examination/treatment/procedure(s) were performed by non-physician practitioner and as supervising physician I was immediately available for consultation/collaboration.  Jasmine Awe, MD 04/13/11 [phone]

## 2011-04-16 ENCOUNTER — Encounter: Payer: Self-pay | Admitting: Gastroenterology

## 2011-04-16 NOTE — ED Provider Notes (Signed)
Medical screening examination/treatment/procedure(s) were performed by non-physician practitioner and as supervising physician I was immediately available for consultation/collaboration.  Jasmine Awe, MD 04/16/11 [phone]

## 2011-05-01 ENCOUNTER — Encounter: Payer: Self-pay | Admitting: Gastroenterology

## 2011-05-01 ENCOUNTER — Ambulatory Visit (INDEPENDENT_AMBULATORY_CARE_PROVIDER_SITE_OTHER): Payer: 59 | Admitting: Gastroenterology

## 2011-05-01 ENCOUNTER — Other Ambulatory Visit (INDEPENDENT_AMBULATORY_CARE_PROVIDER_SITE_OTHER): Payer: 59

## 2011-05-01 VITALS — BP 128/88 | HR 82 | Ht 67.0 in | Wt 187.4 lb

## 2011-05-01 DIAGNOSIS — K219 Gastro-esophageal reflux disease without esophagitis: Secondary | ICD-10-CM

## 2011-05-01 DIAGNOSIS — R197 Diarrhea, unspecified: Secondary | ICD-10-CM

## 2011-05-01 DIAGNOSIS — R7989 Other specified abnormal findings of blood chemistry: Secondary | ICD-10-CM

## 2011-05-01 LAB — COMPREHENSIVE METABOLIC PANEL
ALT: 19 U/L (ref 0–35)
Albumin: 3.8 g/dL (ref 3.5–5.2)
CO2: 28 mEq/L (ref 19–32)
Calcium: 9.4 mg/dL (ref 8.4–10.5)
Chloride: 106 mEq/L (ref 96–112)
Creatinine, Ser: 0.6 mg/dL (ref 0.4–1.2)
GFR: 143.01 mL/min (ref >60.00)
Potassium: 4.2 mEq/L (ref 3.5–5.1)
Total Protein: 7.3 g/dL (ref 6.0–8.3)

## 2011-05-01 LAB — CBC WITH DIFFERENTIAL/PLATELET
Basophils Absolute: 0 10*3/uL (ref 0.0–0.1)
Hemoglobin: 12.1 g/dL (ref 12.0–15.0)
Lymphocytes Relative: 32.9 % (ref 12.0–46.0)
Monocytes Relative: 6.5 % (ref 3.0–12.0)
Neutro Abs: 2.9 10*3/uL (ref 1.4–7.7)
RBC: 3.69 Mil/uL — ABNORMAL LOW (ref 3.87–5.11)
RDW: 13.8 % (ref 11.5–14.6)

## 2011-05-01 MED ORDER — PEG-KCL-NACL-NASULF-NA ASC-C 100 G PO SOLR: 1.0000 | ORAL | Status: DC

## 2011-05-01 NOTE — Patient Instructions (Addendum)
You will be set up for a colonoscopy. You will be set up for an upper endoscopy. Try OTC prilosec or prevacid (or generic equivalent). You will have labs checked today in the basement lab.  Please head down after you check out with the front desk  (cbc, cmet).

## 2011-05-01 NOTE — Progress Notes (Signed)
Addended by: Richardson Chiquito on: 05/01/2011 12:07 PM   Modules accepted: Orders

## 2011-05-01 NOTE — Progress Notes (Signed)
HPI: This is a  very pleasant 50 year old woman whom I am meeting for the first time today  Feeling fine today.  Was having epigastric, bloating pain.  Dyspepsia.  Finally took some antiacids and vomited.  This was last month.  Eventually went to urgent care, was told she was constipated, given maalox.  Vomited.  Went back to [facility] to ER.  Given fluids and iv pain meds.  Had CT, [place], labs.  This was all about 2 weeks long.  For the past 1-2 weeks has been feeling better.  . The rest of her labs were essentially normal. Her urinalysis was slightly abnormal.  Took NSAIDs last week, but has not needed it this week.    She feels better and better.   No sick contacts  Her bowels are normal.  Had no overt GI bleeding.  May have had colonoscopy a LONG time ago.   Review of systems: Pertinent positive and negative review of systems were noted in the above HPI section. Complete review of systems was performed and was otherwise normal.    History reviewed. No pertinent past medical history.  History reviewed. No pertinent past surgical history.  No current outpatient prescriptions on file.    Allergies as of 05/01/2011  . (No Known Allergies)    Family History  Problem Relation Age of Onset  . Diabetes Mother     History   Social History  . Marital Status: Divorced    Spouse Name: N/A    Number of Children: 2  . Years of Education: N/A   Occupational History  .  [employer]   Social History Main Topics  . Smoking status: Never Smoker   . Smokeless tobacco: Never Used  . Alcohol Use: No  . Drug Use: No  . Sexually Active: Not on file   Other Topics Concern  . Not on file   Social History Narrative  . No narrative on file       Physical Exam: BP 128/88  Pulse 82  Ht 5\' 7"  (1.702 m)  Wt 187 lb 6.4 oz (85.004 kg)  BMI 29.35 kg/m2  SpO2 99% Constitutional: generally well-appearing Psychiatric: alert and oriented x3 Eyes: extraocular movements  intact Mouth: oral pharynx moist, no lesions Neck: supple no lymphadenopathy Cardiovascular: heart regular rate and rhythm Lungs: clear to auscultation bilaterally Abdomen: soft, nontender, nondistended, no obvious ascites, no peritoneal signs, normal bowel sounds Extremities: no lower extremity edema bilaterally Skin: no lesions on visible extremities    Assessment and plan: 50 y.o. female with  chronic GERD, recent nausea vomiting, diarrheal event  I suspect her recent vomiting, diarrheal event was infectious. She is feeling better and better and is really back to normal now. She has never had colon cancer screening with colonoscopy and we will arrange that to be done at her soonest convenience. She carries around H2 blocker in her purse and takes these several times a week. This has been going on for years and we will proceed with EGD at the same time to evaluate her for chronic GERD complications such as Barrett's esophagus. I am going to repeat her blood work given her slightly elevated liver tests a month ago.

## 2011-05-10 ENCOUNTER — Telehealth: Payer: Self-pay

## 2011-05-10 ENCOUNTER — Encounter: Payer: Self-pay | Admitting: Gastroenterology

## 2011-05-10 ENCOUNTER — Ambulatory Visit (AMBULATORY_SURGERY_CENTER): Payer: 59 | Admitting: Gastroenterology

## 2011-05-10 DIAGNOSIS — K2980 Duodenitis without bleeding: Secondary | ICD-10-CM

## 2011-05-10 DIAGNOSIS — K219 Gastro-esophageal reflux disease without esophagitis: Secondary | ICD-10-CM

## 2011-05-10 DIAGNOSIS — K449 Diaphragmatic hernia without obstruction or gangrene: Secondary | ICD-10-CM

## 2011-05-10 DIAGNOSIS — R197 Diarrhea, unspecified: Secondary | ICD-10-CM

## 2011-05-10 DIAGNOSIS — K269 Duodenal ulcer, unspecified as acute or chronic, without hemorrhage or perforation: Secondary | ICD-10-CM

## 2011-05-10 DIAGNOSIS — R1110 Vomiting, unspecified: Secondary | ICD-10-CM

## 2011-05-10 MED ORDER — SODIUM CHLORIDE 0.9 % IV SOLN: 500.0000 mL | INTRAVENOUS | Status: DC

## 2011-05-10 NOTE — Op Note (Signed)
[facility] [address] [place], [place]  [address]  COLONOSCOPY PROCEDURE REPORT  PATIENT:  Delynn, Olvera  MR#:  [id number] BIRTHDATE:  10-04-1960, 50 yrs. old  GENDER:  female ENDOSCOPIST:  Rachael Fee, MD PROCEDURE DATE:  05/10/2011 PROCEDURE:  Colonoscopy 47425, Incomplete colonoscopy ASA CLASS:  Class II INDICATIONS:  Routine Risk Screening MEDICATIONS:   Fentanyl 75 mcg IV, These medications were titrated to patient response per physician's verbal order, Versed 9 mg IV  DESCRIPTION OF PROCEDURE:   After the risks benefits and alternatives of the procedure were thoroughly explained, informed consent was obtained.  Digital rectal exam was performed and revealed no rectal masses.   The LB [id number]-[id number] endoscope was introduced through the anus and advanced to the hepatic flexure, limited by a redundant colon.    The quality of the prep was good.  The instrument was then slowly withdrawn as the colon was fully examined. <<PROCEDUREIMAGES>> FINDINGS:  The hepatic flexure, transverse, splenic flexure, descending, sigmoid colon, and rectum appeared unremarkable (see image1 and image2).   The colon was very long and redundant and despite abdominal pressure in several locations as well as changing patient position, I was unable to pass the colonoscope proximal to the hepatic flexure.  Retroflexed views in the rectum revealed no abnormalities. COMPLICATIONS:  None  ENDOSCOPIC IMPRESSION: 1) Normal visualized colon (INCOMPLETE EXAM), up to the hepatic flexure.  RECOMMENDATIONS: Dr. Christella Hartigan' office will arrange a Barium Enema to screen the unseen portion of your colon today (ascending colon, cecum).  ______________________________ Rachael Fee, MD  n. eSIGNED:   Rachael Fee at 05/10/2011 02:59 PM  Cherie Ouch, [phone]

## 2011-05-10 NOTE — Op Note (Signed)
[facility] [address] [place], [place]  [address]  ENDOSCOPY PROCEDURE REPORT  PATIENT:  Jacqueline, Greer  MR#:  [id number] BIRTHDATE:  February 07, 1961, 50 yrs. old  GENDER:  female ENDOSCOPIST:  Rachael Fee, MD PROCEDURE DATE:  05/10/2011 PROCEDURE:  EGD with biopsy, 84[address] ASA CLASS:  Class II INDICATIONS:  chronic GERD, recent vomiting illness MEDICATIONS:   There was residual sedation effect present from prior procedure., Fentanyl 25 mcg IV, These medications were titrated to patient response per physician's verbal order, Versed 1 mg IV TOPICAL ANESTHETIC:  none  DESCRIPTION OF PROCEDURE:   After the risks benefits and alternatives of the procedure were thoroughly explained, informed consent was obtained.  The LB GIF-H180 [id number] endoscope was introduced through the mouth and advanced to the second portion of the duodenum, without limitations.  The instrument was slowly withdrawn as the mucosa was fully examined. <<PROCEDUREIMAGES>> There was a 1cm clean based duodenal bulb ulcer. The surrounding mucosa was inflamed, edematous but not clearly neoplastic however several biopsies were taken (see image6 and image8).  A hiatal hernia was found (see image2). The distal esophagus was tortuous. Otherwise the examination was normal (see image3, image4, and image7).    Retroflexed views revealed no abnormalities.    The scope was then withdrawn from the patient and the procedure completed. COMPLICATIONS:  None ENDOSCOPIC IMPRESSION: 1) Duodenal bulb ulcer, clean based. This did not appear neoplastic but was biopsied 2) Hiatal hernia 3) Otherwise normal examination  RECOMMENDATIONS: Please start OTC prilosec, prevacid once daily. If you take NSAID type pain medicines, you should try to stop for the next 4-6 weeks. Await final biopsies. Dr. Christella Hartigan office will call to set up return visit in 4-6 weeks.   ______________________________ Rachael Fee,  MD  n. eSIGNED:   Rachael Fee at 05/10/2011 03:12 PM  Cherie Ouch, [phone]

## 2011-05-10 NOTE — Patient Instructions (Signed)
Dr Christella Hartigan office will call and set up a barium enema and also a office visit for 4-6 weeks. Please start over the counter prilosec or prevacid. If you take NSAIDS (medicines containing aspirin) please hold those for next 4-6 weeks. You may use tylenol if you need something for pain.  Please follow all discharge instructions given to you by the recovery room nurse. If you have any questions or problems after discharge please call [phone]. You will receive a phone call in the am to see how you are doing and answer any questions you may have. Thank you for choosing [facility] for your health care needs.

## 2011-05-10 NOTE — Progress Notes (Signed)
Patient did not experience any of the following events: a burn prior to discharge; a fall within the facility; wrong site/side/patient/procedure/implant event; or a hospital transfer or hospital admission upon discharge from the facility. ([id number]) Patient did not have preoperative order for IV antibiotic SSI prophylaxis. ([id number])  

## 2011-05-10 NOTE — Telephone Encounter (Signed)
Pt scheduled for BE and ROV   BE is scheduled for 05/16/11 arrive 1015 am for a 1030 am appt.  Pt is to be NPO after midnight and needs to pick up the prep solution at [facility] or [facility]UE   ROV is scheduled for 06/04/11 1115 am Call pt on Monday with appts

## 2011-05-13 ENCOUNTER — Telehealth: Payer: Self-pay | Admitting: *Deleted

## 2011-05-13 NOTE — Telephone Encounter (Signed)

## 2011-05-13 NOTE — Telephone Encounter (Signed)
Pt aware and has been given all the instructions and appt times

## 2011-05-16 ENCOUNTER — Ambulatory Visit (HOSPITAL_COMMUNITY)
Admission: RE | Admit: 2011-05-16 | Discharge: 2011-05-16 | Disposition: A | Discharge status: Home / Self Care | Payer: 59 | Source: Ambulatory Visit | Attending: Gastroenterology | Admitting: Gastroenterology

## 2011-05-16 DIAGNOSIS — R933 Abnormal findings on diagnostic imaging of other parts of digestive tract: Secondary | ICD-10-CM | POA: Insufficient documentation

## 2011-05-16 DIAGNOSIS — R197 Diarrhea, unspecified: Secondary | ICD-10-CM | POA: Insufficient documentation

## 2011-05-20 ENCOUNTER — Telehealth: Payer: Self-pay | Admitting: *Deleted

## 2011-05-20 NOTE — Telephone Encounter (Signed)
Discussed with patient her biopsy results Patient has a follow up appointment for 1-8 Will discuss further with patient  In the office

## 2011-05-20 NOTE — Telephone Encounter (Signed)
Message copied by Marlowe Kays on Mon May 20, 2011  9:36 AM ------      Message from: Rachael Fee      Created: Fri May 17, 2011 12:59 PM             Patty,      Please call the patient.  Biopsies showed inflammation in duodenum. She needs to stay on  PPI once daily, needs rov with me in 4-5 weeks.            Natalie, no result note is needed.

## 2011-06-04 ENCOUNTER — Encounter: Payer: Self-pay | Admitting: Gastroenterology

## 2011-06-04 ENCOUNTER — Ambulatory Visit (INDEPENDENT_AMBULATORY_CARE_PROVIDER_SITE_OTHER): Payer: 59 | Admitting: Gastroenterology

## 2011-06-04 VITALS — BP 122/84 | HR 80 | Ht 67.0 in | Wt 189.2 lb

## 2011-06-04 DIAGNOSIS — R933 Abnormal findings on diagnostic imaging of other parts of digestive tract: Secondary | ICD-10-CM

## 2011-06-04 NOTE — Patient Instructions (Addendum)
Stay on prilosec once daily for another 4 weeks, then back down to every other day.  Call Dr. Christella Hartigan in 8 weeks to report on your symptoms. You will be set up for a colonoscopy at [facility] with propofol to check for the polyps noted on your recent barium enema.   Pt will call when she is ready to schedule her colon.

## 2011-06-04 NOTE — Progress Notes (Signed)
Review of pertinent gastrointestinal problems: 1. routine risk for colon cancer, colonoscopy December 2012 was incomplete due to tortuous colon. Followup barium enema suggested polyps in the ascending colon 2. dyspepsia, duodenal ulceration, EGD December 2012.    Unclear etiology but her symptoms improved with proton pump inhibitor once daily   HPI: This is a  very pleasant 51 year old woman whom I last saw her for upper and lower' endoscopy about a month ago.  She feels well, no pains, no nausea, no vomiting.  Still on once dialy PPI.     History reviewed. No pertinent past medical history.  Past Surgical History  Procedure Date  . Laparoscopic tubal ligation 2003    Current Outpatient Prescriptions  Medication Sig Dispense Refill  . omeprazole (PRILOSEC) 20 MG capsule Take 20 mg by mouth daily.          Allergies as of 06/04/2011  . (No Known Allergies)    Family History  Problem Relation Age of Onset  . Diabetes Mother     History   Social History  . Marital Status: Divorced    Spouse Name: N/A    Number of Children: 2  . Years of Education: N/A   Occupational History  .  [employer]   Social History Main Topics  . Smoking status: Never Smoker   . Smokeless tobacco: Never Used  . Alcohol Use: No  . Drug Use: No  . Sexually Active: Not on file   Other Topics Concern  . Not on file   Social History Narrative  . No narrative on file      Physical Exam: BP 122/84  Pulse 80  Ht 5\' 7"  (1.702 m)  Wt 189 lb 3.2 oz (85.821 kg)  BMI 29.63 kg/m2  SpO2 97% Constitutional: generally well-appearing Psychiatric: alert and oriented x3 Abdomen: soft, nontender, nondistended, no obvious ascites, no peritoneal signs, normal bowel sounds     Assessment and plan: 51 y.o. female with abnormal barium enema, recent duodenal ulceration  She will continue on proton pump inhibitor once daily for another month and then will decrease to every other day and will  call to report on her symptoms. We reviewed the fact that she had an incomplete colonoscopy last month but followup barium enema suggested polyps in her right colon. I recommended repeat colonoscopy with different sedation, different colonoscope. We will schedule her for colonoscopy at [facility] long with propofol at her soonest convenience.

## 2011-06-24 NOTE — Progress Notes (Signed)
Observation review is complete. 

## 2012-05-20 IMAGING — CR DG ABDOMEN 1V
1 series · 1 of 1 positions shown · non-contrast
Comparison: Abdomen film of 05/04/1999 seen

CLINICAL DATA: Generalized abdominal pain for 3 days, vomiting

ABDOMEN - 1 VIEW

[view not recorded]
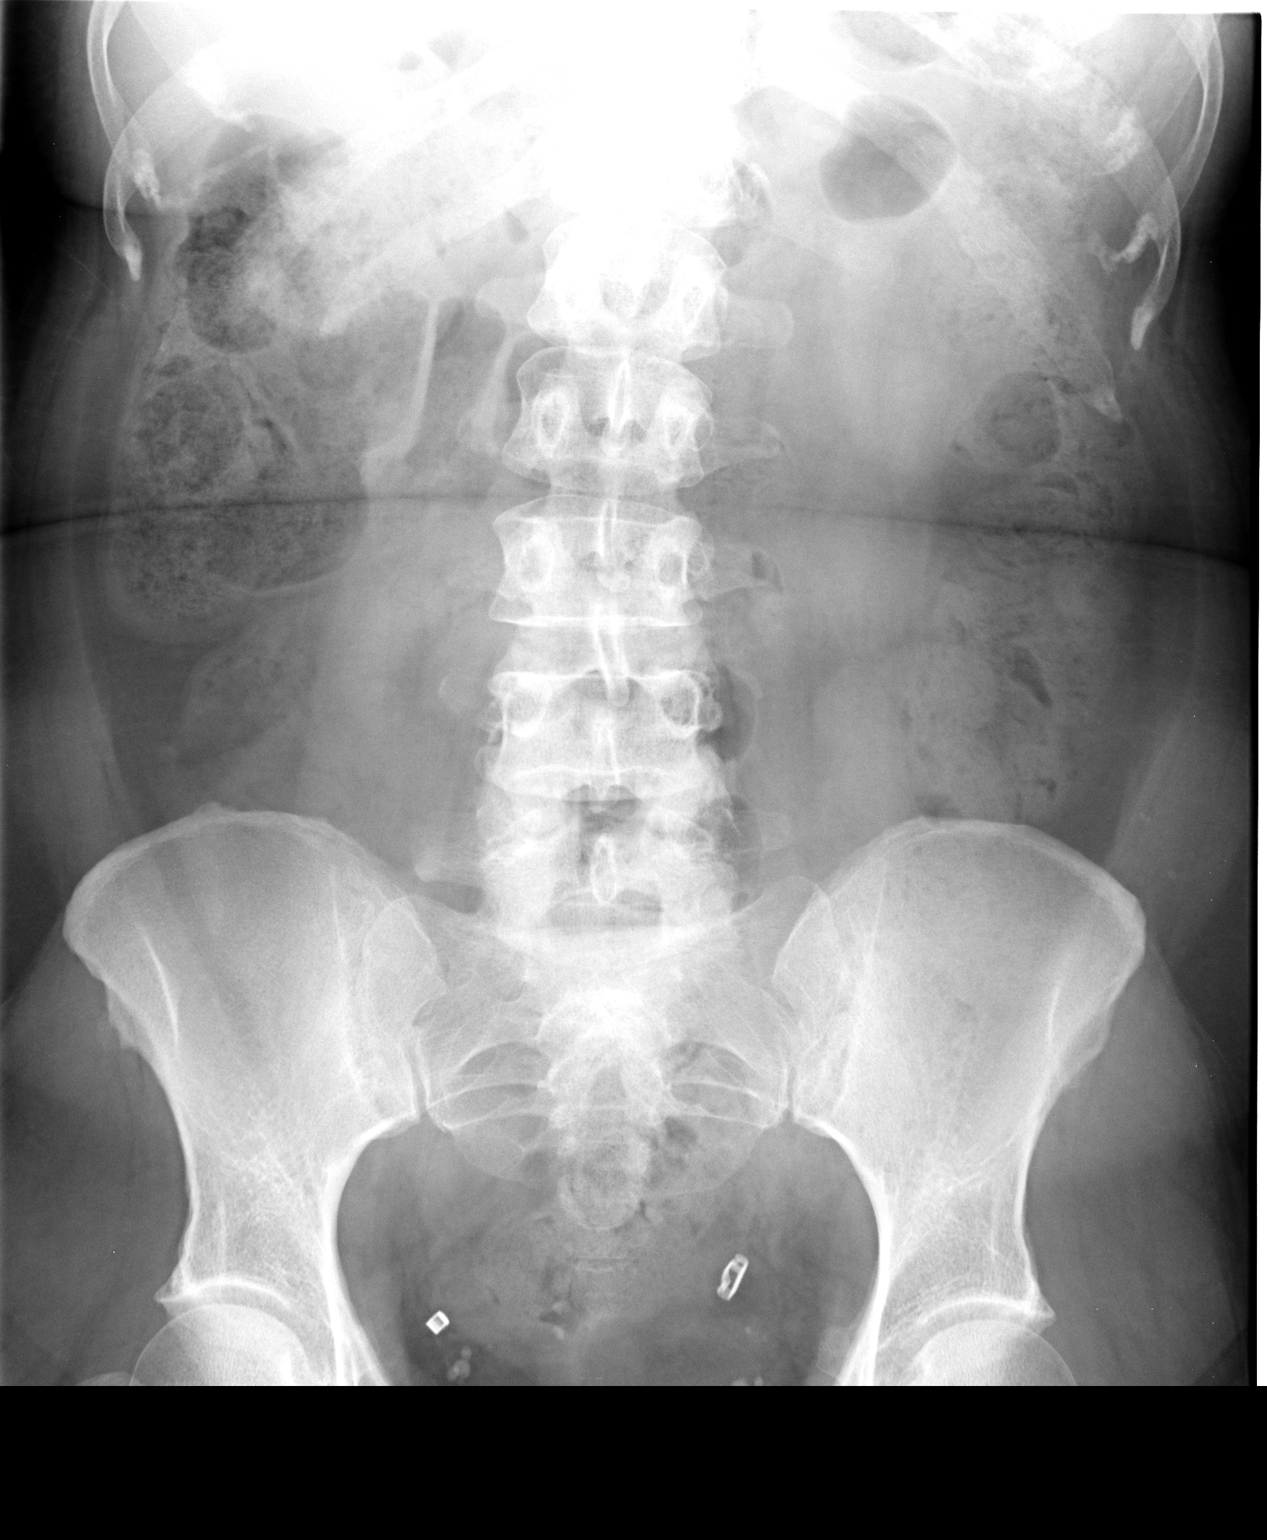

[1 of 1 positions shown; findings below may reference images not displayed]

FINDINGS: There is a moderate amount of feces throughout the colon.
No bowel obstruction is seen.  No opaque calculi noted.  Tubal
ligation clips are present in the pelvis.
IMPRESSION: Moderate amount of feces throughout the colon.  No bowel
obstruction.

## 2012-05-22 IMAGING — US US ABDOMEN COMPLETE
1 series · 13 of 25 positions shown · non-contrast
Comparison: CT of the abdomen and pelvis performed earlier today
at [DATE] a.m., and abdominal ultrasound performed 05/03/2005

CLINICAL DATA: Right upper quadrant abdominal pain and emesis.

ABDOMINAL ULTRASOUND COMPLETE

[Series 1: us abdomen complete · 0.32mm/px · 13 of 77 slices shown]
[im 1/77]
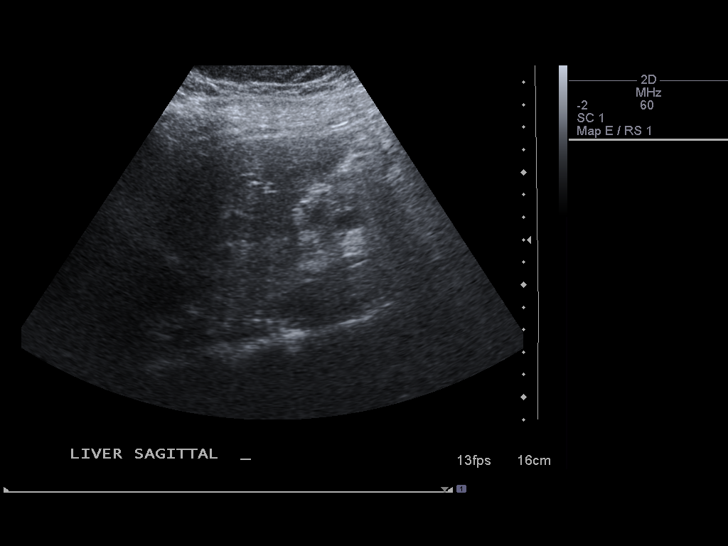
[im 7/77]
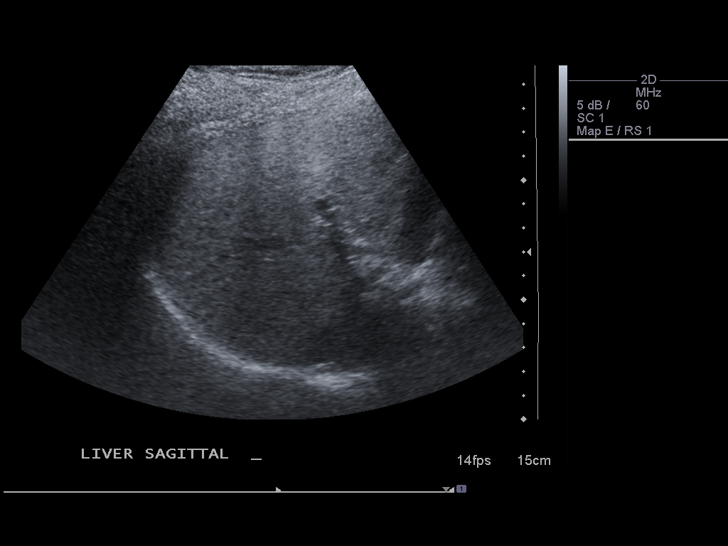
[im 13/77]
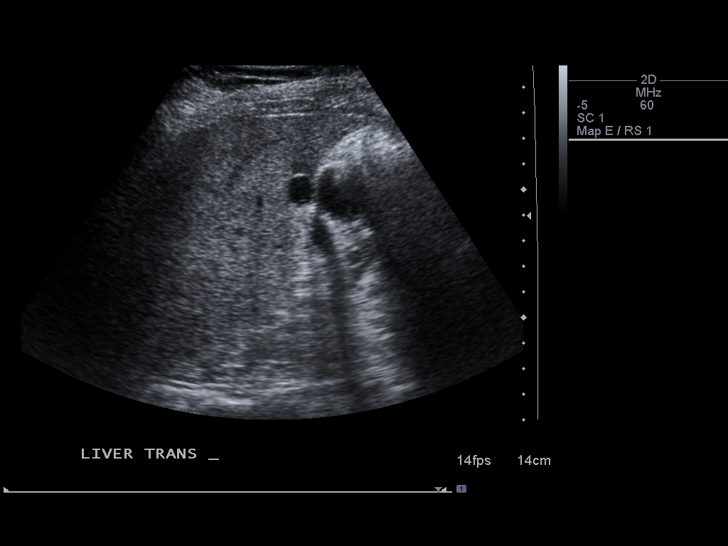
[im 20/77]
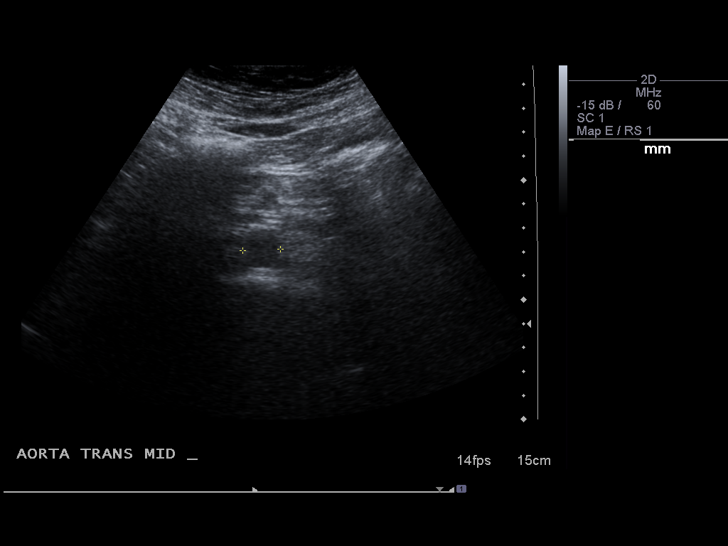
[im 26/77]
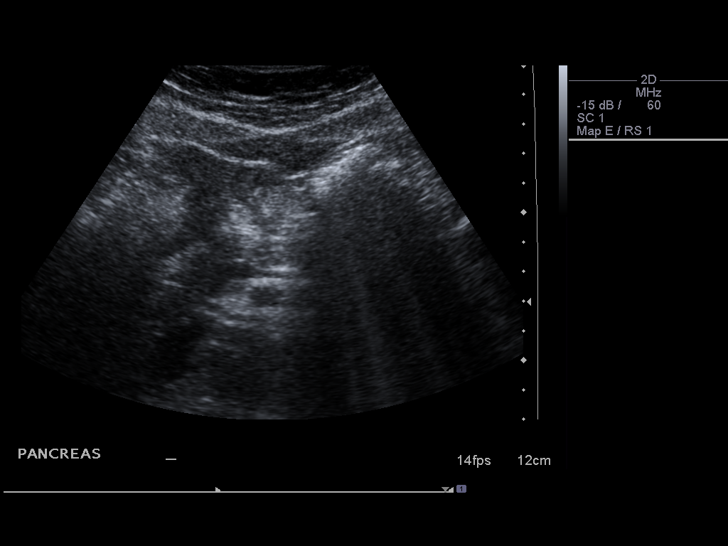
[im 32/77]
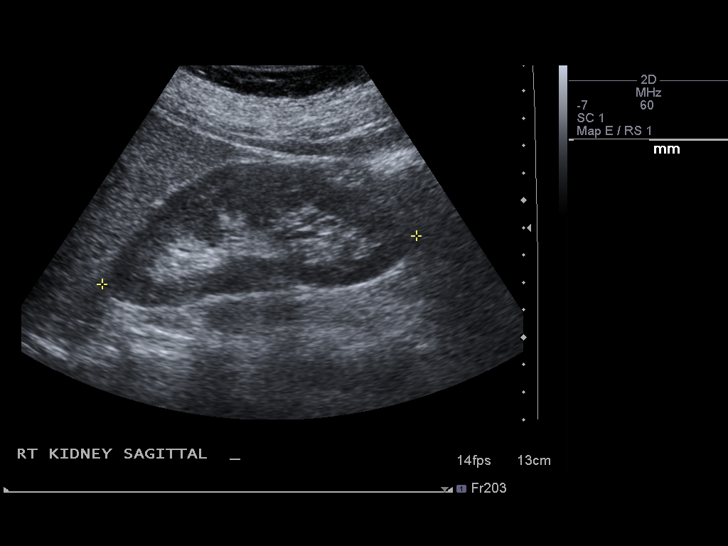
[im 39/77]
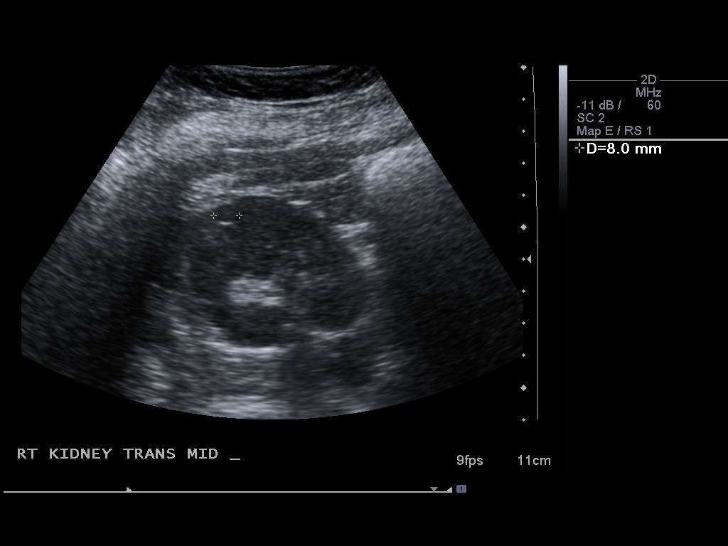
[im 45/77]
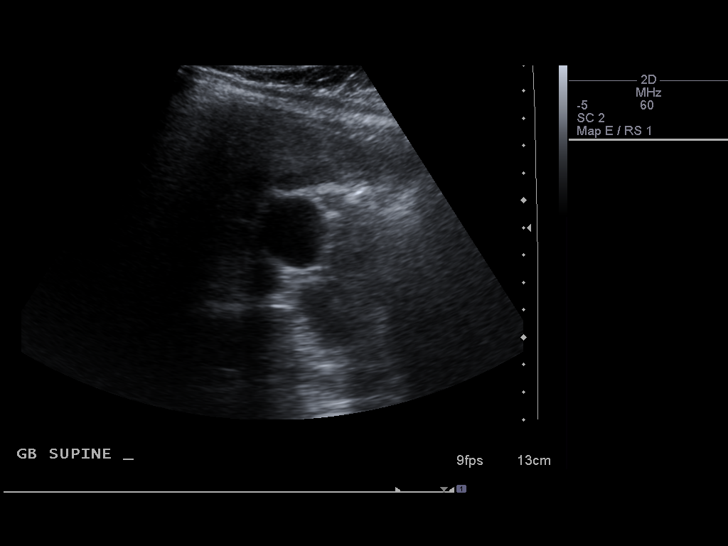
[im 51/77]
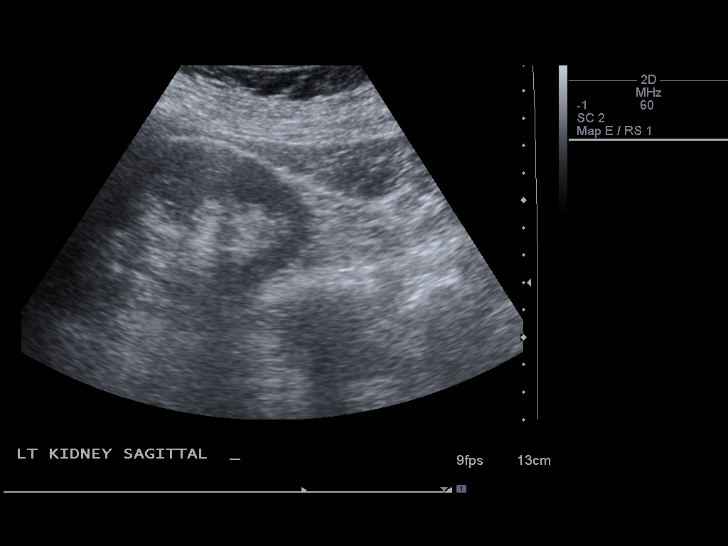
[im 58/77]
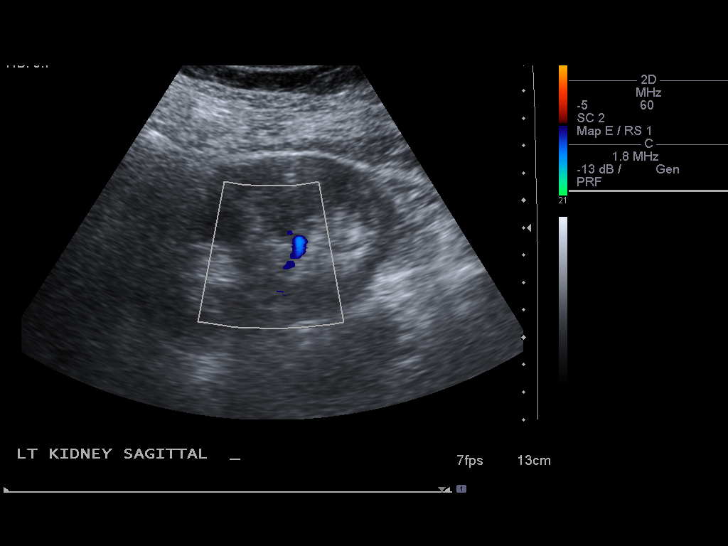
[im 64/77]
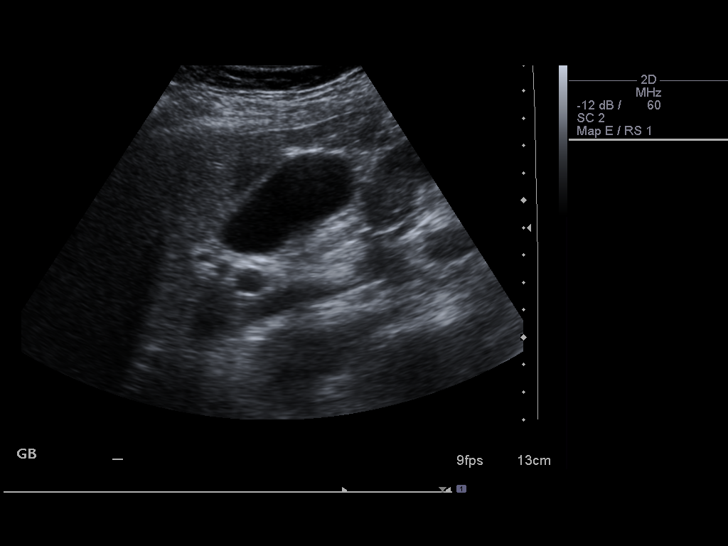
[im 70/77]
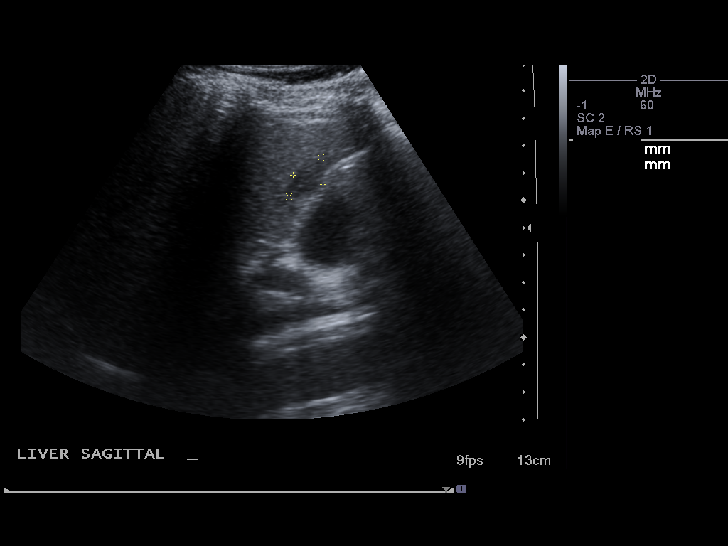
[im 77/77]
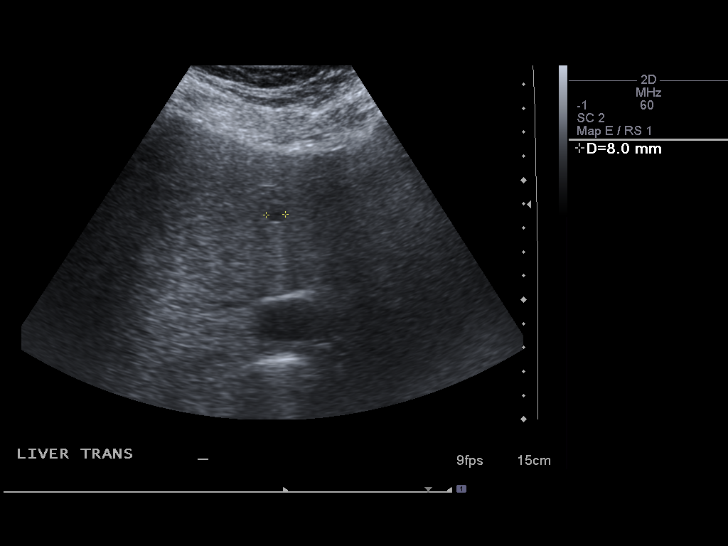

[13 of 25 positions shown; findings below may reference images not displayed]

FINDINGS: Gallbladder:  The gallbladder is normal in appearance, without
evidence for gallstones, gallbladder wall thickening or
pericholecystic fluid.  No ultrasonographic Murphy's sign is
elicited.

Common Bile Duct:  0.4 cm in diameter; within normal limits in
caliber.

Liver:  Diffusely increased hepatic echogenicity and coarsened
echotexture, compatible with fatty infiltration; a small amount of
sparing is noted adjacent to the gallbladder fossa.  The hepatic
cysts characterized on CT are visualized on ultrasound, measuring
0.9 cm in size adjacent to the gallbladder fossa, and 0.8 cm at the
left hepatic lobe.  Limited Doppler evaluation demonstrates normal
blood flow within the liver.

IVC:  Unremarkable in appearance.

Pancreas:  Although the pancreas is difficult to visualize in its
entirety due to overlying bowel gas, no focal pancreatic
abnormality is identified.

Spleen:  8.4 cm in length; within normal limits in size and
echotexture.

Right kidney:  11.3 cm in length; normal in size, configuration and
parenchymal echogenicity.  No evidence of hydronephrosis.  A 0.9 cm
cyst is seen at the interpole region of the right kidney, as
previously noted.

Left kidney:  10.7 cm in length; normal in size, configuration and
parenchymal echogenicity.  No evidence of mass or hydronephrosis.

Abdominal Aorta:  Normal in caliber; no aneurysm identified.
IMPRESSION: 1.  No acute abnormalities identified within the abdomen.
2.  Diffuse fatty infiltration within the liver.
3.  Small hepatic and right renal cysts again noted.

## 2014-03-23 ENCOUNTER — Other Ambulatory Visit: Payer: Self-pay | Admitting: Obstetrics and Gynecology

## 2014-03-24 LAB — CYTOLOGY - PAP

## 2019-02-25 NOTE — Progress Notes (Addendum)
[facility] - Clinic Note  03/01/2019     CHIEF COMPLAINT Patient presents for Retina Evaluation   HISTORY OF PRESENT ILLNESS: Jacqueline Greer is a 58 y.o. female who presents to the clinic today for:   HPI    Retina Evaluation    This started 1 month ago.  Associated Symptoms Negative for Blind Spot, Glare, Shoulder/Hip pain, Fatigue, Jaw Claudication, Photophobia, Distortion, Floaters, Redness, Scalp Tenderness, Weight Loss, Fever, Trauma, Pain and Flashes.  Context:  distance vision, mid-range vision and near vision.  Treatments tried include no treatments.  I, the attending physician,  performed the HPI with the patient and updated documentation appropriately.          Comments    Referral of DeMarco for retina eval. Patient states about a month ago she started noticing blurry vision OD, denies flashes, floaters and ocular pain. Pt reports she has appointment  to be checked for diabetes in two weeks. Pt denies gtt's/sx/laser tx"s        Last edited by Rennis ChrisZamora, Naiara Lombardozzi, MD on 03/01/2019  8:58 AM. (History)    pt states she had a regular eye exam with Dr. Krista BlueeMarco and he "saw something" and made a follow up appt with her, she state when she went back for the second appt is when he noticed the swelling in the back of her eye, pt states her right eye vision is blurry and it justpt states she has an appt in 2 weeks to have blood work checked and see if she has diabetes, pt does not take any medications on a regular basis  Referring physician: Demarco, [place], OD [address] [place] [id number]  HISTORICAL INFORMATION:   Selected notes from the MEDICAL RECORD NUMBER Referred by Dr. Krista Blueemarco for concern of BRVO   CURRENT MEDICATIONS: No current outpatient medications on file. (Ophthalmic Drugs)   No current facility-administered medications for this visit.  (Ophthalmic Drugs)   Current Outpatient Medications (Other)  Medication Sig  . omeprazole  (PRILOSEC) 20 MG capsule Take 20 mg by mouth daily.     No current facility-administered medications for this visit.  (Other)      REVIEW OF SYSTEMS: ROS    Positive for: Eyes   Negative for: Constitutional, Gastrointestinal, Neurological, Skin, Genitourinary, Musculoskeletal, HENT, Endocrine, Cardiovascular, Respiratory, Psychiatric, Allergic/Imm, Heme/Lymph   Last edited by Eldridge ScotKendrick, Glenda, LPN on 46/9/629510/09/2018  8:25 AM. (History)       ALLERGIES No Known Allergies  PAST MEDICAL HISTORY History reviewed. No pertinent past medical history. Past Surgical History:  Procedure Laterality Date  . LAPAROSCOPIC TUBAL LIGATION  2003    FAMILY HISTORY Family History  Problem Relation Age of Onset  . Diabetes Mother   . Kidney failure Father     SOCIAL HISTORY Social History   Tobacco Use  . Smoking status: Never Smoker  . Smokeless tobacco: Never Used  Substance Use Topics  . Alcohol use: No  . Drug use: No         OPHTHALMIC EXAM:  Base Eye Exam    Visual Acuity (Snellen - Linear)      Right Left   Dist cc 20/100 -1 20/25 -1   Dist ph cc 20/70 -2 20/20 -1   Correction: Glasses       Tonometry (Tonopen, 8:35 AM)      Right Left   Pressure 14 15       Pupils      Dark Light  Shape React APD   Right 4 3 Round Brisk None   Left 4 3 Round Brisk None       Visual Fields (Counting fingers)      Left Right    Full Full       Extraocular Movement      Right Left    Full, Ortho Full, Ortho       Neuro/Psych    Oriented x3: Yes       Dilation    Both eyes: 1.0% Mydriacyl, 2.5% Phenylephrine @ 8:35 AM        Slit Lamp and Fundus Exam    Slit Lamp Exam      Right Left   Lids/Lashes Normal Normal   Conjunctiva/Sclera Nasal and temporal Pinguecula, Melanosis Nasal and Pinguecula, Melanosis   Cornea trace Punctate epithelial erosions trace Punctate epithelial erosions   Anterior Chamber Deep and quiet Deep and quiet   Iris Round and dilated, No NVI  Round and dilated, No NVI   Lens 2+ Nuclear sclerosis, 2+ Cortical cataract 2+ Nuclear sclerosis, 2+ Cortical cataract   Vitreous mild Vitreous syneresis mild Vitreous syneresis       Fundus Exam      Right Left   Disc Pink and Sharp, temporal PPP Pink and Sharp, temporal PPP   C/D Ratio 0.5 0.5   Macula Superior BRVO with central CME and focal SRF, dense flame and inter retinal hemes temporal macula Flat, Good foveal reflex, RPE mottling and clumping, No heme or edema   Vessels Vascular attenuation, Tortuous, AV crossing changes, sclerotic arterioles distal ST quad, superior BRVO Vascular attenuation, Tortuous, AV crossing changes   Periphery Attached, DBH temporal peripheray -- extension of BRVO Attached, no heme, WWP temporal and inferior quadrants          IMAGING AND PROCEDURES  Imaging and Procedures for @TODAY @  OCT, Retina - OU - Both Eyes       Right Eye Quality was good. Central Foveal Thickness: 928. Progression has no prior data. Findings include abnormal foveal contour, intraretinal fluid, subretinal fluid, intraretinal hyper-reflective material.   Left Eye Quality was good. Central Foveal Thickness: 239. Progression has no prior data. Findings include normal foveal contour, no IRF, no SRF (Partial PVD).   Notes *Images captured and stored on drive  Diagnosis / Impression:  OD: superior BRVO with CME, ?BRAO component  OS: NFP, no IRF/SRF OU Clinical management:  See below  Abbreviations: NFP - Normal foveal profile. CME - cystoid macular edema. PED - pigment epithelial detachment. IRF - intraretinal fluid. SRF - subretinal fluid. EZ - ellipsoid zone. ERM - epiretinal membrane. ORA - outer retinal atrophy. ORT - outer retinal tubulation. SRHM - subretinal hyper-reflective material        Intravitreal Injection, Pharmacologic Agent - OD - Right Eye       Time Out 03/01/2019. 9:07 AM. Confirmed correct patient, procedure, site, and patient consented.    Anesthesia Topical anesthesia was used. Anesthetic medications included Lidocaine 2%, Proparacaine 0.5%.   Procedure Preparation included 5% betadine to ocular surface, eyelid speculum. A 30 gauge needle was used.   Injection:  1.25 mg Bevacizumab (AVASTIN) SOLN   NDC: 05/01/2019, Lot: [id number] , Expiration date: 04/14/2019   Route: Intravitreal, Site: Right Eye, Waste: 0 mL  Post-op Post injection exam found visual acuity of at least counting fingers. The patient tolerated the procedure well. There were no complications. The patient received written and verbal post procedure care education.  ASSESSMENT/PLAN:    ICD-10-CM   1. 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 Retinal edema  H35.81 OCT, Retina - OU - Both Eyes  4. Hypertensive retinopathy of both eyes  H35.033   5. Combined forms of age-related cataract of both eyes  H25.813     1-3. BRVO with CME OD  - The natural history of retinal vein occlusion and macular edema and treatment options including observation, laser photocoagulation, and intravitreal antiVEGF injection with Avastin and Lucentis and Eylea and intravitreal injection of steroids with triamcinolone and Ozurdex and the complications of these procedures including loss of vision, infection, cataract, glaucoma, and retinal detachment were discussed with patient.  - Specifically discussed findings from [place] / [place] study regarding patient stabilization with anti-VEGF agents and increased potential for visual improvements.  Also discussed need for frequent follow up and potentially multiple injections given the chronic nature of the disease process  - BCVA 20/70  - OCT shows severe CME w/ central SRF and inner retinal hyperreflectivity  - exam and OCT  findings suggestive of a BRAO component contributing  - recommend IVA OD #1 today, 10.05.20 for BRVO w/ CME  - pt wishes to proceed  - RBA of procedure discussed, questions answered  - informed consent obtained and signed  - see procedure note  - F/U 4 weeks -- DFE/OCT/possible injection  4. Hypertensive retinopathy OU  - pt not formally diagnosed with HTN  - discussed importance of tight BP control and its relation to problems #1-3 above  - pt is scheduled for appt with new PCP to establish care  - monitor  5. 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  - discussed diagnosis and progression  - not yet visually significant  - monitor for now   Ophthalmic Meds Ordered this visit:  Meds ordered this encounter  Medications  . Bevacizumab (AVASTIN) SOLN 1.25 mg       Return in about 4 weeks (around 03/29/2019) for f/u BRVO OD, DFE, OCT.  There are no Patient Instructions on file for this visit.   Explained the diagnoses, plan, and follow up with the patient and they expressed understanding.  Patient expressed understanding of the importance of proper follow up care.   This document serves as a record of services personally performed by Gardiner Sleeper, MD, PhD. It was created on their behalf by Ernest Mallick, OA, an ophthalmic assistant. The creation of this record is the provider's dictation and/or activities during the visit.    Electronically signed by: Ernest Mallick, OA 10.05.2020 11:37 AM    Gardiner Sleeper, M.D., Ph.D. Diseases & Surgery of the Retina and Vitreous [facility]  I have reviewed the above documentation for accuracy and completeness, and I agree with the above. Gardiner Sleeper, M.D., Ph.D. 03/01/19 11:37 AM    Abbreviations: M myopia (nearsighted); A astigmatism; H hyperopia (farsighted); P presbyopia; Mrx spectacle prescription;  CTL  contact lenses; OD right eye; OS left eye; OU both eyes  XT exotropia; ET esotropia; PEK punctate epithelial keratitis; PEE punctate epithelial erosions; DES dry eye syndrome; MGD meibomian gland dysfunction; ATs artificial tears; PFAT's preservative free artificial tears; [place] nuclear sclerotic  cataract; PSC posterior subcapsular cataract; ERM epi-retinal membrane; PVD posterior vitreous detachment; RD retinal detachment; DM diabetes mellitus; DR diabetic retinopathy; NPDR non-proliferative diabetic retinopathy; PDR proliferative diabetic retinopathy; CSME clinically significant macular edema; DME diabetic macular edema; dbh dot blot hemorrhages; CWS cotton wool spot; POAG primary open angle glaucoma; C/D cup-to-disc ratio; HVF humphrey visual field; GVF goldmann visual field; OCT optical coherence tomography; IOP intraocular pressure; BRVO Branch retinal vein occlusion; CRVO central retinal vein occlusion; CRAO central retinal artery occlusion; BRAO branch retinal artery occlusion; RT retinal tear; SB scleral buckle; PPV pars plana vitrectomy; VH Vitreous hemorrhage; PRP panretinal laser photocoagulation; IVK intravitreal kenalog; VMT vitreomacular traction; MH Macular hole;  NVD neovascularization of the disc; NVE neovascularization elsewhere; AREDS age related eye disease study; ARMD age related macular degeneration; POAG primary open angle glaucoma; EBMD epithelial/anterior basement membrane dystrophy; ACIOL anterior chamber intraocular lens; IOL intraocular lens; PCIOL posterior chamber intraocular lens; Phaco/IOL phacoemulsification with intraocular lens placement; [place] photorefractive keratectomy; LASIK laser assisted in situ keratomileusis; HTN hypertension; DM diabetes mellitus; COPD chronic obstructive pulmonary disease

## 2019-03-01 ENCOUNTER — Encounter (INDEPENDENT_AMBULATORY_CARE_PROVIDER_SITE_OTHER): Payer: Self-pay | Admitting: Ophthalmology

## 2019-03-01 ENCOUNTER — Ambulatory Visit (INDEPENDENT_AMBULATORY_CARE_PROVIDER_SITE_OTHER): Payer: 59 | Admitting: Ophthalmology

## 2019-03-01 ENCOUNTER — Other Ambulatory Visit: Payer: Self-pay

## 2019-03-01 DIAGNOSIS — H34231 Retinal artery branch occlusion, right eye: Secondary | ICD-10-CM

## 2019-03-01 DIAGNOSIS — H34831 Tributary (branch) retinal vein occlusion, right eye, with macular edema: Secondary | ICD-10-CM | POA: Diagnosis not present

## 2019-03-01 DIAGNOSIS — H25813 Combined forms of age-related cataract, bilateral: Secondary | ICD-10-CM

## 2019-03-01 DIAGNOSIS — H35033 Hypertensive retinopathy, bilateral: Secondary | ICD-10-CM

## 2019-03-01 DIAGNOSIS — H3581 Retinal edema: Secondary | ICD-10-CM | POA: Diagnosis not present

## 2019-03-01 MED ORDER — BEVACIZUMAB CHEMO INJECTION 1.25MG/0.05ML SYRINGE FOR KALEIDOSCOPE
1.2500 mg | INTRAVITREAL | Status: AC | PRN
Start: 2019-03-01 — End: 2019-03-01
  Administered 2019-03-01: 12:00:00 1.25 mg via INTRAVITREAL

## 2019-03-23 NOTE — Progress Notes (Signed)
[facility] - Clinic Note  03/29/2019     CHIEF COMPLAINT Patient presents for Retina Follow Up   HISTORY OF PRESENT ILLNESS: Jacqueline Greer is a 58 y.o. female who presents to the clinic today for:   HPI    Retina Follow Up    Patient presents with  CRVO/BRVO.  In right eye.  This started 1 month ago.  Since onset it is stable.  I, the attending physician,  performed the HPI with the patient and updated documentation appropriately.          Comments    F/u BRVO OD. Patient states her vision "has gotten better", denies new visual onsets/issues. Pt is ready for tx today if indicted.        Last edited by Rennis Chris, MD on 03/29/2019  9:48 AM. (History)    pt states she is "better", she can tell her vision has gotten better, pt had no problems with the injection at last appt  Referring physician: Demarco, [place], OD [address] [place],  [place] [address]  HISTORICAL INFORMATION:   Selected notes from the MEDICAL RECORD NUMBER Referred by Dr. Krista Blue for concern of BRVO   CURRENT MEDICATIONS: No current outpatient medications on file. (Ophthalmic Drugs)   No current facility-administered medications for this visit.  (Ophthalmic Drugs)   Current Outpatient Medications (Other)  Medication Sig  . omeprazole (PRILOSEC) 20 MG capsule Take 20 mg by mouth daily.     No current facility-administered medications for this visit.  (Other)      REVIEW OF SYSTEMS: ROS    Positive for: Eyes   Negative for: Constitutional, Gastrointestinal, Neurological, Skin, Genitourinary, Musculoskeletal, HENT, Endocrine, Cardiovascular, Respiratory, Psychiatric, Allergic/Imm, Heme/Lymph   Last edited by Eldridge Scot, LPN on 37/05/624  8:51 AM. (History)       ALLERGIES No Known Allergies  PAST MEDICAL HISTORY History reviewed. No pertinent past medical history. Past Surgical History:  Procedure Laterality Date  . LAPAROSCOPIC TUBAL LIGATION  2003     FAMILY HISTORY Family History  Problem Relation Age of Onset  . Diabetes Mother   . Kidney failure Father     SOCIAL HISTORY Social History   Tobacco Use  . Smoking status: Never Smoker  . Smokeless tobacco: Never Used  Substance Use Topics  . Alcohol use: No  . Drug use: No         OPHTHALMIC EXAM:  Base Eye Exam    Visual Acuity (Snellen - Linear)      Right Left   Dist cc 20/30 -1 20/20   Dist ph cc NI    Correction: Glasses       Tonometry (Tonopen, 8:49 AM)      Right Left   Pressure 10 12       Pupils      Dark Light Shape React APD   Right 3 2 Round Brisk None   Left 3 2 Round Brisk None       Visual Fields      Left Right    Full Full       Extraocular Movement      Right Left    Full, Ortho Full, Ortho       Neuro/Psych    Oriented x3: Yes       Dilation    Both eyes: 1.0% Mydriacyl, 2.5% Phenylephrine @ 8:49 AM        Slit Lamp and Fundus Exam    Slit  Lamp Exam      Right Left   Lids/Lashes Normal Normal   Conjunctiva/Sclera Nasal and temporal Pinguecula, Melanosis Nasal and Pinguecula, Melanosis   Cornea trace Punctate epithelial erosions trace Punctate epithelial erosions   Anterior Chamber Deep and quiet Deep and quiet   Iris Round and dilated, No NVI Round and dilated, No NVI   Lens 2+ Nuclear sclerosis, 2+ Cortical cataract 2+ Nuclear sclerosis, 2+ Cortical cataract   Vitreous mild Vitreous syneresis mild Vitreous syneresis       Fundus Exam      Right Left   Disc Pink and Sharp, temporal PPP Pink and Sharp, temporal PPP   C/D Ratio 0.5 0.5   Macula Superior BRVO with central CME and focal SRF vastly improved, superior IRH less confluent Flat, Good foveal reflex, RPE mottling and clumping, No heme or edema   Vessels Vascular attenuation, Tortuous, AV crossing changes, sclerotic arterioles distal ST quad, superior BRVO Vascular attenuation, Tortuous, AV crossing changes   Periphery Attached, DBH temporal peripheray --  extension of BRVO -- improving Attached, no heme, WWP temporal and inferior quadrants          IMAGING AND PROCEDURES  Imaging and Procedures for @TODAY @  OCT, Retina - OU - Both Eyes       Right Eye Quality was good. Central Foveal Thickness: 297. Progression has improved. Findings include intraretinal fluid, subretinal fluid, intraretinal hyper-reflective material, normal foveal contour (Massive improvement in IRF -- now just trace cystic changes; mild central SRF).   Left Eye Quality was good. Central Foveal Thickness: 238. Progression has been stable. Findings include normal foveal contour, no IRF, no SRF (Partial PVD).   Notes *Images captured and stored on drive  Diagnosis / Impression:  OD: superior BRVO with CME, ?BRAO component -- Massive improvement in IRF -- now just trace cystic changes; mild central SRF OS: NFP, no IRF/SRF OU  Clinical management:  See below  Abbreviations: NFP - Normal foveal profile. CME - cystoid macular edema. PED - pigment epithelial detachment. IRF - intraretinal fluid. SRF - subretinal fluid. EZ - ellipsoid zone. ERM - epiretinal membrane. ORA - outer retinal atrophy. ORT - outer retinal tubulation. SRHM - subretinal hyper-reflective material        Intravitreal Injection, Pharmacologic Agent - OD - Right Eye       Time Out 03/29/2019. 8:52 AM. Confirmed correct patient, procedure, site, and patient consented.   Anesthesia Topical anesthesia was used. Anesthetic medications included Lidocaine 2%, Proparacaine 0.5%.   Procedure Preparation included 5% betadine to ocular surface, eyelid speculum. A supplied needle was used.   Injection:  1.25 mg Bevacizumab (AVASTIN) SOLN   NDC: 16109-604-5450242-060-01, Lot: [id number] , Expiration date: 05/12/2019   Route: Intravitreal, Site: Right Eye, Waste: 0 mL  Post-op Post injection exam found visual acuity of at least counting fingers. The patient tolerated the procedure well. There were no  complications. The patient received written and verbal post procedure care education.                 ASSESSMENT/PLAN:    ICD-10-CM   1. 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 Retinal edema  H35.81 OCT, Retina - OU - Both Eyes  4. Hypertensive retinopathy of both eyes  H35.033   5. Combined forms  of age-related cataract of both eyes  H25.813     1-3. BRVO with CME OD  - s/p IVA OD #1 (10.05.20)  - BCVA improved to 20/30 from 20/70  - OCT shows massive improvement in IRF  - initial exam and OCT findings suggestive of a BRAO component contributing  - recommend IVA OD #2 today, 11.02.20 for BRVO w/ CME  - pt wishes to proceed  - RBA of procedure discussed, questions answered  - informed consent obtained and signed  - see procedure note  - F/U 4 weeks -- DFE/OCT/possible injection  4. Hypertensive retinopathy OU  - pt not formally diagnosed with HTN  - discussed importance of tight BP control and its relation to problems #1-3 above  - pt is scheduled for appt with new PCP to establish care  - monitor  5. 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  - discussed diagnosis and progression  - not yet visually significant  - monitor for now   Ophthalmic Meds Ordered this visit:  Meds ordered this encounter  Medications  . Bevacizumab (AVASTIN) SOLN 1.25 mg       Return in about 4 weeks (around 04/26/2019) for f/u BRVO OD.  There are no Patient Instructions on file for this visit.   Explained the diagnoses, plan, and follow up with the patient and they expressed understanding.  Patient expressed understanding of the importance of proper follow up care.    Electronically signed by:  Leeann Must, [place] 10.27.2020 11:18AM  This document serves as a record of services personally performed by Gardiner Sleeper, MD, PhD. It was created on their behalf by Ernest Mallick, OA, an ophthalmic assistant. The creation of this record is the provider's dictation and/or activities during the visit.    Electronically signed by: Ernest Mallick, OA 11.02.2020 11:53 AM   Gardiner Sleeper, M.D., Ph.D. Diseases & Surgery of the Retina and Vitreous [facility]  I have reviewed the above documentation for accuracy and completeness, and I agree with the above. Gardiner Sleeper, M.D., Ph.D. 03/29/19 11:53 AM   Abbreviations: M myopia (nearsighted); A astigmatism; H hyperopia (farsighted); P presbyopia; Mrx spectacle prescription;  CTL contact lenses; OD right eye; OS left eye; OU both eyes  XT exotropia; ET esotropia; PEK punctate epithelial keratitis; PEE punctate epithelial erosions; DES dry eye syndrome; MGD meibomian gland dysfunction; ATs artificial tears; PFAT's preservative free artificial tears; [place] nuclear sclerotic cataract; PSC posterior subcapsular cataract; ERM epi-retinal membrane; PVD posterior vitreous detachment; RD retinal detachment; DM diabetes mellitus; DR diabetic retinopathy; NPDR non-proliferative diabetic retinopathy; PDR proliferative diabetic retinopathy; CSME clinically significant macular edema; DME diabetic macular edema; dbh dot blot hemorrhages; CWS cotton wool spot; POAG primary open angle glaucoma; C/D cup-to-disc ratio; HVF humphrey visual field; GVF goldmann visual field; OCT optical coherence tomography; IOP intraocular pressure; BRVO Branch retinal vein occlusion; CRVO central retinal vein occlusion; CRAO central retinal artery occlusion; BRAO branch retinal artery occlusion; RT retinal tear; SB scleral buckle; PPV pars plana vitrectomy; VH Vitreous hemorrhage; PRP panretinal laser photocoagulation; IVK intravitreal kenalog; VMT vitreomacular  traction; MH Macular hole;  NVD neovascularization of the disc; NVE neovascularization elsewhere; AREDS age related eye disease study; ARMD age related macular degeneration; POAG primary open angle glaucoma; EBMD epithelial/anterior basement membrane dystrophy; ACIOL anterior chamber intraocular lens; IOL intraocular lens; PCIOL posterior chamber intraocular lens; Phaco/IOL phacoemulsification with intraocular lens  placement; [place] photorefractive keratectomy; LASIK laser assisted in situ keratomileusis; HTN hypertension; DM diabetes mellitus; COPD chronic obstructive pulmonary disease

## 2019-03-29 ENCOUNTER — Other Ambulatory Visit: Payer: Self-pay

## 2019-03-29 ENCOUNTER — Ambulatory Visit (INDEPENDENT_AMBULATORY_CARE_PROVIDER_SITE_OTHER): Payer: 59 | Admitting: Ophthalmology

## 2019-03-29 ENCOUNTER — Encounter (INDEPENDENT_AMBULATORY_CARE_PROVIDER_SITE_OTHER): Payer: Self-pay | Admitting: Ophthalmology

## 2019-03-29 DIAGNOSIS — H34231 Retinal artery branch occlusion, right eye: Secondary | ICD-10-CM

## 2019-03-29 DIAGNOSIS — H34831 Tributary (branch) retinal vein occlusion, right eye, with macular edema: Secondary | ICD-10-CM | POA: Diagnosis not present

## 2019-03-29 DIAGNOSIS — H35033 Hypertensive retinopathy, bilateral: Secondary | ICD-10-CM

## 2019-03-29 DIAGNOSIS — H25813 Combined forms of age-related cataract, bilateral: Secondary | ICD-10-CM

## 2019-03-29 DIAGNOSIS — H3581 Retinal edema: Secondary | ICD-10-CM

## 2019-03-29 MED ORDER — BEVACIZUMAB CHEMO INJECTION 1.25MG/0.05ML SYRINGE FOR KALEIDOSCOPE
1.2500 mg | INTRAVITREAL | Status: AC | PRN
  Administered 2019-03-29: 12:00:00 1.25 mg via INTRAVITREAL

## 2019-04-13 NOTE — Progress Notes (Addendum)
[facility] - Clinic Note  04/26/2019     CHIEF COMPLAINT Patient presents for Retina Follow Up   HISTORY OF PRESENT ILLNESS: Jacqueline Greer is a 58 y.o. female who presents to the clinic today for:   HPI    Retina Follow Up    Patient presents with  CRVO/BRVO.  In right eye.  This started 8 weeks ago.  Severity is moderate.  Duration of 4 weeks.  Since onset it is stable.  I, the attending physician,  performed the HPI with the patient and updated documentation appropriately.          Comments    58 y/o female pt here for 4 wk f/u for BRVO OD.  No change in [place] OU.  Denies pain, flashes, floaters.  No gtts.       Last edited by Rennis Chris, MD on 04/26/2019 10:53 AM. (History)    pt states she has not had any problems since her last visit and she had no problems with the injection at last visit  Referring physician: Demarco, [place], OD [address] [place],  [place] [address]  HISTORICAL INFORMATION:   Selected notes from the MEDICAL RECORD NUMBER Referred by Dr. Krista Blue for concern of BRVO   CURRENT MEDICATIONS: No current outpatient medications on file. (Ophthalmic Drugs)   No current facility-administered medications for this visit.  (Ophthalmic Drugs)   Current Outpatient Medications (Other)  Medication Sig  . omeprazole (PRILOSEC) 20 MG capsule Take 20 mg by mouth daily.     No current facility-administered medications for this visit.  (Other)      REVIEW OF SYSTEMS: ROS    Positive for: Eyes   Negative for: Constitutional, Gastrointestinal, Neurological, Skin, Genitourinary, Musculoskeletal, HENT, Endocrine, Cardiovascular, Respiratory, Psychiatric, Allergic/Imm, Heme/Lymph   Last edited by Celine Mans, COA on 04/26/2019  9:58 AM. (History)       ALLERGIES No Known Allergies  PAST MEDICAL HISTORY Past Medical History:  Diagnosis Date  . Cataract    OU  . Hypertensive retinopathy    OU   Past Surgical History:   Procedure Laterality Date  . LAPAROSCOPIC TUBAL LIGATION  2003    FAMILY HISTORY Family History  Problem Relation Age of Onset  . Diabetes Mother   . Kidney failure Father     SOCIAL HISTORY Social History   Tobacco Use  . Smoking status: Never Smoker  . Smokeless tobacco: Never Used  Substance Use Topics  . Alcohol use: No  . Drug use: No         OPHTHALMIC EXAM:  Base Eye Exam    Visual Acuity (Snellen - Linear)      Right Left   Dist cc 20/30 -2 20/20 +   Dist ph cc NI    Correction: Glasses       Tonometry (Tonopen, 9:59 AM)      Right Left   Pressure 14 16       Pupils      Dark Light Shape React APD   Right 3 2 Round Brisk None   Left 3 2 Round Brisk None       Visual Fields (Counting fingers)      Left Right    Full Full       Extraocular Movement      Right Left    Full, Ortho Full, Ortho       Neuro/Psych    Oriented x3: Yes   Mood/Affect: Normal  Dilation    Both eyes: 1.0% Mydriacyl, 2.5% Phenylephrine @ 9:59 AM        Slit Lamp and Fundus Exam    Slit Lamp Exam      Right Left   Lids/Lashes Normal Normal   Conjunctiva/Sclera Nasal and temporal Pinguecula, Melanosis Nasal and Pinguecula, Melanosis   Cornea trace Punctate epithelial erosions trace Punctate epithelial erosions   Anterior Chamber Deep and quiet Deep and quiet   Iris Round and dilated, No NVI Round and dilated, No NVI   Lens 2+ Nuclear sclerosis, 2+ Cortical cataract 2+ Nuclear sclerosis, 2+ Cortical cataract   Vitreous mild Vitreous syneresis mild Vitreous syneresis       Fundus Exam      Right Left   Disc Pink and Sharp, temporal PPP Pink and Sharp, temporal PPP   C/D Ratio 0.5 0.5   Macula Superior BRVO with central CME -- improved; focal central SRF almost resolved, superior IRH -- improved Flat, Good foveal reflex, RPE mottling and clumping, No heme or edema, geographic area of hyperpigmented RPE nasal and superior to fovea   Vessels Vascular  attenuation, Tortuous, AV crossing changes, sclerotic arterioles distal ST quad, superior BRVO Vascular attenuation, Tortuous, AV crossing changes   Periphery Attached, DBH temporal periphery -- extension of BRVO -- improving Attached, no heme, WWP temporal and inferior quadrants          IMAGING AND PROCEDURES  Imaging and Procedures for @TODAY @  OCT, Retina - OU - Both Eyes       Right Eye Quality was good. Central Foveal Thickness: 282. Progression has improved. Findings include intraretinal fluid, subretinal fluid, intraretinal hyper-reflective material, normal foveal contour (Interval improvement in IRF/SRF).   Left Eye Quality was good. Central Foveal Thickness: 239. Progression has been stable. Findings include normal foveal contour, no IRF, no SRF (Partial PVD).   Notes *Images captured and stored on drive  Diagnosis / Impression:  OD: superior BRVO with CME, ?BRAO component -- Interval improvement in IRF/SRF OS: NFP, no IRF/SRF OU  Clinical management:  See below  Abbreviations: NFP - Normal foveal profile. CME - cystoid macular edema. PED - pigment epithelial detachment. IRF - intraretinal fluid. SRF - subretinal fluid. EZ - ellipsoid zone. ERM - epiretinal membrane. ORA - outer retinal atrophy. ORT - outer retinal tubulation. SRHM - subretinal hyper-reflective material        Intravitreal Injection, Pharmacologic Agent - OD - Right Eye       Time Out 04/26/2019. 10:11 AM. Confirmed correct patient, procedure, site, and patient consented.   Anesthesia Topical anesthesia was used. Anesthetic medications included Lidocaine 2%, Proparacaine 0.5%.   Procedure Preparation included 5% betadine to ocular surface, eyelid speculum. A supplied needle was used.   Injection:  1.25 mg Bevacizumab (AVASTIN) SOLN   NDC: 36644-034-7450242-060-01, Lot: [id number] , Expiration date: 07/02/2019   Route: Intravitreal, Site: Right Eye, Waste: 0 mL  Post-op Post injection exam found  visual acuity of at least counting fingers. The patient tolerated the procedure well. There were no complications. The patient received written and verbal post procedure care education.                 ASSESSMENT/PLAN:    ICD-10-CM   1. 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 Retinal edema  H35.81 OCT, Retina - OU - Both Eyes  4. Hypertensive retinopathy of both eyes  H35.033   5. Combined forms of age-related cataract of both eyes  H25.813     1-3. BRVO with CME OD  - s/p IVA OD #1 (10.05.20), #2 (11.02.20)  - initial exam and OCT findings suggestive of a BRAO component contributing  - BCVA 20/30 from 20/70  - OCT shows improvement in IRF/SRF  - recommend IVA OD #3 today, 11.30.20 for BRVO w/ CME  - pt wishes to proceed  - RBA of procedure discussed, questions answered  - informed consent obtained and signed  - see procedure note  - F/U 4 weeks -- DFE/OCT/possible injection  4. Hypertensive retinopathy OU  - pt not formally diagnosed with HTN  - discussed importance of tight BP control and its relation to problems #1-3 above  - pt is scheduled for appt with new PCP to establish care  - monitor  5. 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  - discussed diagnosis and progression  - not yet visually significant  - monitor for now   Ophthalmic Meds Ordered this visit:  Meds ordered this encounter  Medications  . Bevacizumab (AVASTIN) SOLN 1.25 mg       Return in about 5 weeks (around 05/31/2019) for f/u BRVO OD, DFE, OCT.  There are no Patient Instructions on file for this visit.   Explained the diagnoses, plan, and follow up with the patient and they expressed understanding.   Patient expressed understanding of the importance of proper follow up care.   This document serves as a record of services personally performed by Gardiner Sleeper, MD, PhD. It was created on their behalf by Leeann Must, [place], a certified ophthalmic assistant. The creation of this record is the provider's dictation and/or activities during the visit.    Electronically signed by: Leeann Must, COA @TODAY @ 12:30 PM   This document serves as a record of services personally performed by Gardiner Sleeper, MD, PhD. It was created on their behalf by Ernest Mallick, OA, an ophthalmic assistant. The creation of this record is the provider's dictation and/or activities during the visit.    Electronically signed by: Ernest Mallick, OA 11.30.2020 12:30 PM   Gardiner Sleeper, M.D., Ph.D. Diseases & Surgery of the Retina and Vitreous [facility]  I have reviewed the above documentation for accuracy and completeness, and I agree with the above. Gardiner Sleeper, M.D., Ph.D. 04/26/19 12:30 PM    Abbreviations: M myopia (nearsighted); A astigmatism; H hyperopia (farsighted); P presbyopia; Mrx spectacle prescription;  CTL contact lenses; OD right eye; OS left eye; OU both eyes  XT exotropia; ET esotropia; PEK punctate epithelial keratitis; PEE punctate epithelial erosions; DES dry eye syndrome; MGD meibomian gland dysfunction; ATs artificial tears; PFAT's preservative free artificial tears; [place] nuclear sclerotic cataract; PSC posterior subcapsular cataract; ERM epi-retinal membrane; PVD posterior vitreous detachment; RD retinal detachment; DM diabetes mellitus; DR diabetic retinopathy; NPDR non-proliferative diabetic retinopathy; PDR proliferative diabetic retinopathy; CSME clinically significant macular edema; DME diabetic macular edema; dbh dot blot hemorrhages; CWS cotton wool spot; POAG primary open angle glaucoma; C/D cup-to-disc ratio; HVF humphrey visual field; GVF goldmann visual field;  OCT optical coherence tomography; IOP intraocular pressure; BRVO Branch retinal vein occlusion; CRVO central retinal vein occlusion; CRAO central retinal artery occlusion; BRAO branch retinal artery occlusion; RT retinal  tear; SB scleral buckle; PPV pars plana vitrectomy; VH Vitreous hemorrhage; PRP panretinal laser photocoagulation; IVK intravitreal kenalog; VMT vitreomacular traction; MH Macular hole;  NVD neovascularization of the disc; NVE neovascularization elsewhere; AREDS age related eye disease study; ARMD age related macular degeneration; POAG primary open angle glaucoma; EBMD epithelial/anterior basement membrane dystrophy; ACIOL anterior chamber intraocular lens; IOL intraocular lens; PCIOL posterior chamber intraocular lens; Phaco/IOL phacoemulsification with intraocular lens placement; [place] photorefractive keratectomy; LASIK laser assisted in situ keratomileusis; HTN hypertension; DM diabetes mellitus; COPD chronic obstructive pulmonary disease

## 2019-04-26 ENCOUNTER — Other Ambulatory Visit: Payer: Self-pay

## 2019-04-26 ENCOUNTER — Encounter (INDEPENDENT_AMBULATORY_CARE_PROVIDER_SITE_OTHER): Payer: Self-pay | Admitting: Ophthalmology

## 2019-04-26 ENCOUNTER — Ambulatory Visit (INDEPENDENT_AMBULATORY_CARE_PROVIDER_SITE_OTHER): Payer: 59 | Admitting: Ophthalmology

## 2019-04-26 DIAGNOSIS — H34231 Retinal artery branch occlusion, right eye: Secondary | ICD-10-CM | POA: Diagnosis not present

## 2019-04-26 DIAGNOSIS — H35033 Hypertensive retinopathy, bilateral: Secondary | ICD-10-CM | POA: Diagnosis not present

## 2019-04-26 DIAGNOSIS — H3581 Retinal edema: Secondary | ICD-10-CM | POA: Diagnosis not present

## 2019-04-26 DIAGNOSIS — H34831 Tributary (branch) retinal vein occlusion, right eye, with macular edema: Secondary | ICD-10-CM | POA: Diagnosis not present

## 2019-04-26 DIAGNOSIS — H25813 Combined forms of age-related cataract, bilateral: Secondary | ICD-10-CM

## 2019-04-26 MED ORDER — BEVACIZUMAB CHEMO INJECTION 1.25MG/0.05ML SYRINGE FOR KALEIDOSCOPE
1.2500 mg | INTRAVITREAL | Status: AC | PRN
  Administered 2019-04-26: 1.25 mg via INTRAVITREAL

## 2019-05-31 ENCOUNTER — Encounter (INDEPENDENT_AMBULATORY_CARE_PROVIDER_SITE_OTHER): Payer: 59 | Admitting: Ophthalmology

## 2019-05-31 NOTE — Progress Notes (Signed)
[facility] - Clinic Note  06/03/2019     CHIEF COMPLAINT Patient presents for Retina Follow Up   HISTORY OF PRESENT ILLNESS: Jacqueline Greer is a 59 y.o. female who presents to the clinic today for:   HPI    Retina Follow Up    Patient presents with  CRVO/BRVO.  In right eye.  This started months ago.  Severity is moderate.  Duration of 5 weeks.  Since onset it is stable.  I, the attending physician,  performed the HPI with the patient and updated documentation appropriately.          Comments    59 y/o female pt here for 5 wk f/u for BRVO OD.  Denies change in [place] OU.  Denies pain, flashes, floaters.  No gtts.  Eyes have felt a little dry/irritated this week.       Last edited by Rennis Chris, MD on 06/03/2019  4:01 PM. (History)    pt states she had no problems with the injection last time, she states she has had some blurry vision recently, but it has cleared up  Referring physician: No referring provider defined for this encounter.  HISTORICAL INFORMATION:   Selected notes from the MEDICAL RECORD NUMBER Referred by Dr. Krista Blue for concern of BRVO   CURRENT MEDICATIONS: No current outpatient medications on file. (Ophthalmic Drugs)   No current facility-administered medications for this visit. (Ophthalmic Drugs)   Current Outpatient Medications (Other)  Medication Sig  . omeprazole (PRILOSEC) 20 MG capsule Take 20 mg by mouth daily.     No current facility-administered medications for this visit. (Other)      REVIEW OF SYSTEMS: ROS    Positive for: Eyes   Negative for: Constitutional, Gastrointestinal, Neurological, Skin, Genitourinary, Musculoskeletal, HENT, Endocrine, Cardiovascular, Respiratory, Psychiatric, Heme/Lymph   Last edited by Celine Mans, COA on 06/03/2019  2:45 PM. (History)       ALLERGIES No Known Allergies  PAST MEDICAL HISTORY Past Medical History:  Diagnosis Date  . Cataract    OU  . Hypertensive retinopathy     OU   Past Surgical History:  Procedure Laterality Date  . LAPAROSCOPIC TUBAL LIGATION  2003    FAMILY HISTORY Family History  Problem Relation Age of Onset  . Diabetes Mother   . Kidney failure Father     SOCIAL HISTORY Social History   Tobacco Use  . Smoking status: Never Smoker  . Smokeless tobacco: Never Used  Substance Use Topics  . Alcohol use: No  . Drug use: No         OPHTHALMIC EXAM:  Base Eye Exam    Visual Acuity (Snellen - Linear)      Right Left   Dist cc 20/30 -2 20/25 -2   Dist ph cc NI 20/20 -2   Correction: Glasses       Tonometry (Tonopen, 2:47 PM)      Right Left   Pressure 13 14       Pupils      Dark Light Shape React APD   Right 3 2 Round Brisk None   Left 3 2 Round Brisk None       Visual Fields (Counting fingers)      Left Right    Full Full       Extraocular Movement      Right Left    Full, Ortho Full, Ortho       Neuro/Psych    Oriented  x3: Yes   Mood/Affect: Normal       Dilation    Both eyes: 1.0% Mydriacyl, 2.5% Phenylephrine @ 2:47 PM        Slit Lamp and Fundus Exam    Slit Lamp Exam      Right Left   Lids/Lashes Normal Normal   Conjunctiva/Sclera Nasal and temporal Pinguecula, Melanosis Nasal and Pinguecula, Melanosis   Cornea trace Punctate epithelial erosions trace Punctate epithelial erosions   Anterior Chamber Deep and quiet Deep and quiet   Iris Round and dilated, No NVI Round and dilated, No NVI   Lens 2+ Nuclear sclerosis, 2+ Cortical cataract 2+ Nuclear sclerosis, 2+ Cortical cataract   Vitreous mild Vitreous syneresis mild Vitreous syneresis       Fundus Exam      Right Left   Disc Pink and Sharp, temporal PPP Pink and Sharp, temporal PPP   C/D Ratio 0.5 0.5   Macula Superior BRVO with central CME -- improved; focal central SRF - resolved, superior IRH -- improved, residual clusters of DBH temporal macula Flat, Good foveal reflex, RPE mottling and clumping, No heme or edema, geographic  area of hypopigmented RPE nasal and superior to fovea   Vessels Vascular attenuation, Tortuous, AV crossing changes, sclerotic arterioles distal ST quad, superior BRVO Vascular attenuation, Tortuous, AV crossing changes   Periphery Attached, DBH temporal periphery -- extension of BRVO -- improving Attached, no heme, WWP temporal and inferior quadrants          IMAGING AND PROCEDURES  Imaging and Procedures for @TODAY @  OCT, Retina - OU - Both Eyes       Right Eye Quality was good. Central Foveal Thickness: 280. Progression has improved. Findings include intraretinal fluid, intraretinal hyper-reflective material, normal foveal contour, no SRF (Interval improvement in SRF and cystic changes).   Left Eye Quality was good. Central Foveal Thickness: 240. Progression has been stable. Findings include normal foveal contour, no IRF, no SRF (Partial PVD).   Notes *Images captured and stored on drive  Diagnosis / Impression:  OD: superior BRVO with CME, ?BRAO component -- Interval improvement in SRF and cystic changes OS: NFP, no IRF/SRF OU  Clinical management:  See below  Abbreviations: NFP - Normal foveal profile. CME - cystoid macular edema. PED - pigment epithelial detachment. IRF - intraretinal fluid. SRF - subretinal fluid. EZ - ellipsoid zone. ERM - epiretinal membrane. ORA - outer retinal atrophy. ORT - outer retinal tubulation. SRHM - subretinal hyper-reflective material        Intravitreal Injection, Pharmacologic Agent - OD - Right Eye       Time Out 06/03/2019. 2:52 PM. Confirmed correct patient, procedure, site, and patient consented.   Anesthesia Topical anesthesia was used. Anesthetic medications included Lidocaine 2%, Proparacaine 0.5%.   Procedure Preparation included 5% betadine to ocular surface, eyelid speculum. A 30 gauge needle was used.   Injection:  1.25 mg Bevacizumab (AVASTIN) SOLN   NDC: 08/01/2019, Lot: [id number] , Expiration date: 07/27/2019    Route: Intravitreal, Site: Right Eye, Waste: 0 mL  Post-op Post injection exam found visual acuity of at least counting fingers. The patient tolerated the procedure well. There were no complications. The patient received written and verbal post procedure care education.                 ASSESSMENT/PLAN:    ICD-10-CM   1. Branch retinal vein occlusion of right eye with macular edema  H34.8310 Intravitreal Injection, Pharmacologic Agent - OD - Right  Eye    Bevacizumab (AVASTIN) SOLN 1.25 mg  2. 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 Retinal edema  H35.81 OCT, Retina - OU - Both Eyes  4. Hypertensive retinopathy of both eyes  H35.033   5. Combined forms of age-related cataract of both eyes  H25.813     1-3. BRVO with CME OD  - s/p IVA OD #1 (10.05.20), #2 (11.02.20), #3 (11.30.20)  - initial exam and OCT findings suggestive of a BRAO component contributing  - BCVA 20/30 from 20/70  - OCT shows improvement in IRF/SRF  - recommend IVA OD #4 today, 01.07.21 for BRVO w/ CME  - pt wishes to proceed  - RBA of procedure discussed, questions answered  - informed consent obtained and signed  - see procedure note  - F/U 4 weeks -- DFE/OCT/possible injection  4. Hypertensive retinopathy OU  - pt not formally diagnosed with HTN  - discussed importance of tight BP control and its relation to problems #1-3 above  - pt is scheduled for appt with new PCP to establish care  - monitor  5. 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  - discussed diagnosis and progression  - not yet visually significant  - monitor for now   Ophthalmic Meds Ordered this visit:  Meds ordered this encounter  Medications  . Bevacizumab (AVASTIN) SOLN 1.25 mg       Return in about 4 weeks (around 07/01/2019) for f/u BRVO OD, DFE, OCT.  There are no Patient Instructions on file for this  visit.   Explained the diagnoses, plan, and follow up with the patient and they expressed understanding.  Patient expressed understanding of the importance of proper follow up care.   This document serves as a record of services personally performed by Gardiner Sleeper, MD, PhD. It was created on their behalf by Leeann Must, [place], a certified ophthalmic assistant. The creation of this record is the provider's dictation and/or activities during the visit.    Electronically signed by: Leeann Must, COA @TODAY @ 2:07 AM   This document serves as a record of services personally performed by Gardiner Sleeper, MD, PhD. It was created on their behalf by Ernest Mallick, OA, an ophthalmic assistant. The creation of this record is the provider's dictation and/or activities during the visit.    Electronically signed by: Ernest Mallick, OA 01.07.2021 2:07 AM   Gardiner Sleeper, M.D., Ph.D. Diseases & Surgery of the Retina and Vitreous [facility]  I have reviewed the above documentation for accuracy and completeness, and I agree with the above. Gardiner Sleeper, M.D., Ph.D. 06/05/19 2:07 AM   Abbreviations: M myopia (nearsighted); A astigmatism; H hyperopia (farsighted); P presbyopia; Mrx spectacle prescription;  CTL contact lenses; OD right eye; OS left eye; OU both eyes  XT exotropia; ET esotropia; PEK punctate epithelial keratitis; PEE punctate epithelial erosions; DES dry eye syndrome; MGD meibomian gland dysfunction; ATs artificial tears; PFAT's preservative free artificial tears; [place] nuclear sclerotic cataract; PSC posterior subcapsular cataract; ERM epi-retinal membrane; PVD posterior vitreous detachment; RD retinal detachment; DM diabetes mellitus; DR diabetic retinopathy; NPDR non-proliferative diabetic retinopathy; PDR proliferative diabetic retinopathy; CSME clinically significant macular edema; DME diabetic macular edema; dbh dot blot hemorrhages; CWS cotton wool spot; POAG  primary open angle glaucoma; C/D cup-to-disc ratio; HVF humphrey visual field; GVF goldmann visual field; OCT optical  coherence tomography; IOP intraocular pressure; BRVO Branch retinal vein occlusion; CRVO central retinal vein occlusion; CRAO central retinal artery occlusion; BRAO branch retinal artery occlusion; RT retinal tear; SB scleral buckle; PPV pars plana vitrectomy; VH Vitreous hemorrhage; PRP panretinal laser photocoagulation; IVK intravitreal kenalog; VMT vitreomacular traction; MH Macular hole;  NVD neovascularization of the disc; NVE neovascularization elsewhere; AREDS age related eye disease study; ARMD age related macular degeneration; POAG primary open angle glaucoma; EBMD epithelial/anterior basement membrane dystrophy; ACIOL anterior chamber intraocular lens; IOL intraocular lens; PCIOL posterior chamber intraocular lens; Phaco/IOL phacoemulsification with intraocular lens placement; [place] photorefractive keratectomy; LASIK laser assisted in situ keratomileusis; HTN hypertension; DM diabetes mellitus; COPD chronic obstructive pulmonary disease

## 2019-06-03 ENCOUNTER — Encounter (INDEPENDENT_AMBULATORY_CARE_PROVIDER_SITE_OTHER): Payer: Self-pay | Admitting: Ophthalmology

## 2019-06-03 ENCOUNTER — Ambulatory Visit (INDEPENDENT_AMBULATORY_CARE_PROVIDER_SITE_OTHER): Payer: 59 | Admitting: Ophthalmology

## 2019-06-03 DIAGNOSIS — H34231 Retinal artery branch occlusion, right eye: Secondary | ICD-10-CM | POA: Diagnosis not present

## 2019-06-03 DIAGNOSIS — H25813 Combined forms of age-related cataract, bilateral: Secondary | ICD-10-CM

## 2019-06-03 DIAGNOSIS — H3581 Retinal edema: Secondary | ICD-10-CM

## 2019-06-03 DIAGNOSIS — H35033 Hypertensive retinopathy, bilateral: Secondary | ICD-10-CM | POA: Diagnosis not present

## 2019-06-03 DIAGNOSIS — H34831 Tributary (branch) retinal vein occlusion, right eye, with macular edema: Secondary | ICD-10-CM

## 2019-06-05 ENCOUNTER — Encounter (INDEPENDENT_AMBULATORY_CARE_PROVIDER_SITE_OTHER): Payer: Self-pay | Admitting: Ophthalmology

## 2019-06-05 MED ORDER — BEVACIZUMAB CHEMO INJECTION 1.25MG/0.05ML SYRINGE FOR KALEIDOSCOPE
1.2500 mg | INTRAVITREAL | Status: AC | PRN
Start: 2019-06-05 — End: 2019-06-05
  Administered 2019-06-05: 1.25 mg via INTRAVITREAL

## 2019-06-30 NOTE — Progress Notes (Signed)
[facility] - Clinic Note  07/02/2019     CHIEF COMPLAINT Patient presents for Retina Follow Up   HISTORY OF PRESENT ILLNESS: Jacqueline Greer is a 59 y.o. female who presents to the clinic today for:   HPI    Retina Follow Up    Patient presents with  CRVO/BRVO.  In right eye.  This started 4 weeks ago.  Severity is moderate.  I, the attending physician,  performed the HPI with the patient and updated documentation appropriately.          Comments    Patient here for 4 weeks retina follow up for BRVO with CME OD. Patient states vision doing good. No eye pain.       Last edited by Rennis Chris, MD on 07/02/2019  1:27 PM. (History)    pt states she is doing well, she is not having any problems with the injections   Referring physician: No referring provider defined for this encounter.  HISTORICAL INFORMATION:   Selected notes from the MEDICAL RECORD NUMBER Referred by Dr. Krista Blue for concern of BRVO   CURRENT MEDICATIONS: No current outpatient medications on file. (Ophthalmic Drugs)   No current facility-administered medications for this visit. (Ophthalmic Drugs)   Current Outpatient Medications (Other)  Medication Sig  . omeprazole (PRILOSEC) 20 MG capsule Take 20 mg by mouth daily.     No current facility-administered medications for this visit. (Other)      REVIEW OF SYSTEMS: ROS    Positive for: Eyes   Negative for: Constitutional, Gastrointestinal, Neurological, Skin, Genitourinary, Musculoskeletal, HENT, Endocrine, Cardiovascular, Respiratory, Psychiatric, Heme/Lymph   Last edited by Laddie Aquas, COA on 07/02/2019  1:26 PM. (History)       ALLERGIES No Known Allergies  PAST MEDICAL HISTORY Past Medical History:  Diagnosis Date  . Cataract    OU  . Hypertensive retinopathy    OU   Past Surgical History:  Procedure Laterality Date  . LAPAROSCOPIC TUBAL LIGATION  2003    FAMILY HISTORY Family History  Problem Relation  Age of Onset  . Diabetes Mother   . Kidney failure Father     SOCIAL HISTORY Social History   Tobacco Use  . Smoking status: Never Smoker  . Smokeless tobacco: Never Used  Substance Use Topics  . Alcohol use: No  . Drug use: No         OPHTHALMIC EXAM:  Base Eye Exam    Visual Acuity (Snellen - Linear)      Right Left   Dist cc 20/25 +2 20/20   Correction: Glasses       Tonometry (Tonopen, 1:24 PM)      Right Left   Pressure 17 18       Pupils      Dark Light Shape React APD   Right 3 2 Round Brisk None   Left 3 2 Round Brisk None       Visual Fields (Counting fingers)      Left Right    Full Full       Extraocular Movement      Right Left    Full, Ortho Full, Ortho       Neuro/Psych    Oriented x3: Yes   Mood/Affect: Normal       Dilation    Both eyes: 1.0% Mydriacyl, 2.5% Phenylephrine @ 1:24 PM        Slit Lamp and Fundus Exam    Slit Lamp  Exam      Right Left   Lids/Lashes Normal Normal   Conjunctiva/Sclera Nasal and temporal Pinguecula, Melanosis Nasal and Pinguecula, Melanosis   Cornea trace Punctate epithelial erosions trace Punctate epithelial erosions   Anterior Chamber Deep and quiet Deep and quiet   Iris Round and dilated, No NVI Round and dilated, No NVI   Lens 2+ Nuclear sclerosis, 2+ Cortical cataract 2+ Nuclear sclerosis, 2+ Cortical cataract   Vitreous mild Vitreous syneresis mild Vitreous syneresis       Fundus Exam      Right Left   Disc Pink and Sharp, temporal PPP Pink and Sharp, temporal PPP   C/D Ratio 0.5 0.5   Macula Superior BRVO with central CME -- improved; stable resolition of SRF, superior IRH -- improved, residual clusters of DBH temporal macula Flat, Good foveal reflex, RPE mottling and clumping, No heme or edema, geographic area of hypopigmented RPE nasal and superior to fovea   Vessels Vascular attenuation, Tortuous, AV crossing changes, sclerotic arterioles distal ST quad, superior BRVO Vascular attenuation,  Tortuous, AV crossing changes   Periphery Attached, DBH temporal periphery -- extension of BRVO -- improving, shallow schisis cavity IT periphery; no RT/RD on scleral depressed exam Attached, no heme, WWP temporal and inferior quadrants          IMAGING AND PROCEDURES  Imaging and Procedures for @TODAY @  OCT, Retina - OU - Both Eyes       Right Eye Quality was good. Central Foveal Thickness: 284. Progression has been stable. Findings include intraretinal fluid, intraretinal hyper-reflective material, normal foveal contour, no SRF (Persistent IRF, mild interval improvement in cystic changes).   Left Eye Quality was good. Central Foveal Thickness: 242. Progression has been stable. Findings include normal foveal contour, no IRF, no SRF (Partial PVD).   Notes *Images captured and stored on drive  Diagnosis / Impression:  OD: superior BRVO with CME, ?BRAO component -- Persistent IRF, mild interval improvement in cystic changes OS: NFP, no IRF/SRF OU  Clinical management:  See below  Abbreviations: NFP - Normal foveal profile. CME - cystoid macular edema. PED - pigment epithelial detachment. IRF - intraretinal fluid. SRF - subretinal fluid. EZ - ellipsoid zone. ERM - epiretinal membrane. ORA - outer retinal atrophy. ORT - outer retinal tubulation. SRHM - subretinal hyper-reflective material        Intravitreal Injection, Pharmacologic Agent - OD - Right Eye       Time Out 07/02/2019. 1:20 PM. Confirmed correct patient, procedure, site, and patient consented.   Anesthesia Topical anesthesia was used. Anesthetic medications included Lidocaine 2%, Proparacaine 0.5%.   Procedure Preparation included 5% betadine to ocular surface, eyelid speculum. A supplied needle was used.   Injection:  1.25 mg Bevacizumab (AVASTIN) SOLN   NDC: 51025-852-77, Lot: [id number] , Expiration date: 08/04/2019   Route: Intravitreal, Site: Right Eye, Waste: 0 mL  Post-op Post injection exam found  visual acuity of at least counting fingers. The patient tolerated the procedure well. There were no complications. The patient received written and verbal post procedure care education.                 ASSESSMENT/PLAN:    ICD-10-CM   1. 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 Retinal edema  H35.81 OCT, Retina -  OU - Both Eyes  4. Hypertensive retinopathy of both eyes  H35.033   5. Combined forms of age-related cataract of both eyes  H25.813     1-3. BRVO with CME OD  - s/p IVA OD #1 (10.05.20), #2 (11.02.20), #3 (11.30.20), #4 (01.07.21)  - initial exam and OCT findings suggestive of a BRAO component contributing  - BCVA 20/25 from 20/30  - OCT shows interval improvement in IRF  - recommend IVA OD #5 today, 02.05.21 for BRVO w/ CME  - pt wishes to proceed  - RBA of procedure discussed, questions answered  - informed consent obtained and signed  - see procedure note  - Avastin informed consent obtain and scanned on 01.07.21  - F/U 4 weeks -- DFE/OCT/possible injection, widefield OCT through schisis  4. Hypertensive retinopathy OU  - pt not formally diagnosed with HTN  - discussed importance of tight BP control and its relation to problems #1-3 above  - pt is scheduled for appt with new PCP to establish care  - monitor  5. 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 Bevacizumab (AVASTIN) SOLN 1.25 mg       Return in about 4 weeks (around 07/30/2019) for f/u BRVO OD, DFE, OCT.  There are no Patient Instructions on file for this visit.   Explained the diagnoses, plan, and follow up with the patient and they expressed understanding.  Patient expressed understanding of the importance of proper follow up care.   This document serves as a record of services personally performed by Karie Chimera, MD, PhD. It was created on their behalf by Herby Abraham, COA, a certified ophthalmic assistant. The creation of this record is the provider's dictation and/or activities during the visit.    This document serves as a record of services personally performed by Karie Chimera, MD, PhD. It was created on their behalf by Laurian Brim, OA, an ophthalmic assistant. The creation of this record is the provider's dictation and/or activities during the visit.    Electronically signed by: Laurian Brim, OA 02.03.2021 4:12 PM   Karie Chimera, M.D., Ph.D. Diseases & Surgery of the Retina and Vitreous [facility]  I have reviewed the above documentation for accuracy and completeness, and I agree with the above. Karie Chimera, M.D., Ph.D. 07/02/19 4:12 PM    Abbreviations: M myopia (nearsighted); A astigmatism; H hyperopia (farsighted); P presbyopia; Mrx spectacle prescription;  CTL contact lenses; OD right eye; OS left eye; OU both eyes  XT exotropia; ET esotropia; PEK punctate epithelial keratitis; PEE punctate epithelial erosions; DES dry eye syndrome; MGD meibomian gland dysfunction; ATs artificial tears; PFAT's preservative free artificial tears; [place] nuclear sclerotic cataract; PSC posterior subcapsular cataract; ERM epi-retinal membrane; PVD posterior vitreous detachment; RD retinal detachment; DM diabetes mellitus; DR diabetic retinopathy; NPDR non-proliferative diabetic retinopathy; PDR proliferative diabetic retinopathy;  CSME clinically significant macular edema; DME diabetic macular edema; dbh dot blot hemorrhages; CWS cotton wool spot; POAG primary open angle glaucoma; C/D cup-to-disc ratio; HVF humphrey visual  field; GVF goldmann visual field; OCT optical coherence tomography; IOP intraocular pressure; BRVO Branch retinal vein occlusion; CRVO central retinal vein occlusion; CRAO central retinal artery occlusion; BRAO branch retinal artery occlusion; RT retinal tear; SB scleral buckle; PPV pars plana vitrectomy; VH Vitreous hemorrhage; PRP panretinal laser photocoagulation; IVK intravitreal kenalog; VMT vitreomacular traction; MH Macular hole;  NVD neovascularization of the disc; NVE neovascularization elsewhere; AREDS age related eye disease study; ARMD age related macular degeneration; POAG primary open angle glaucoma; EBMD epithelial/anterior basement membrane dystrophy; ACIOL anterior chamber intraocular lens; IOL intraocular lens; PCIOL posterior chamber intraocular lens; Phaco/IOL phacoemulsification with intraocular lens placement; [place] photorefractive keratectomy; LASIK laser assisted in situ keratomileusis; HTN hypertension; DM diabetes mellitus; COPD chronic obstructive pulmonary disease

## 2019-07-02 ENCOUNTER — Encounter (INDEPENDENT_AMBULATORY_CARE_PROVIDER_SITE_OTHER): Payer: Self-pay | Admitting: Ophthalmology

## 2019-07-02 ENCOUNTER — Ambulatory Visit (INDEPENDENT_AMBULATORY_CARE_PROVIDER_SITE_OTHER): Payer: 59 | Admitting: Ophthalmology

## 2019-07-02 DIAGNOSIS — H34231 Retinal artery branch occlusion, right eye: Secondary | ICD-10-CM

## 2019-07-02 DIAGNOSIS — H25813 Combined forms of age-related cataract, bilateral: Secondary | ICD-10-CM

## 2019-07-02 DIAGNOSIS — H34831 Tributary (branch) retinal vein occlusion, right eye, with macular edema: Secondary | ICD-10-CM

## 2019-07-02 DIAGNOSIS — H35033 Hypertensive retinopathy, bilateral: Secondary | ICD-10-CM | POA: Diagnosis not present

## 2019-07-02 DIAGNOSIS — H3581 Retinal edema: Secondary | ICD-10-CM

## 2019-07-02 MED ORDER — BEVACIZUMAB CHEMO INJECTION 1.25MG/0.05ML SYRINGE FOR KALEIDOSCOPE
1.2500 mg | INTRAVITREAL | Status: AC | PRN
  Administered 2019-07-02: 16:00:00 1.25 mg via INTRAVITREAL

## 2019-07-27 NOTE — Progress Notes (Signed)
[facility] Note  07/30/2019     CHIEF COMPLAINT Patient presents for Retina Follow Up   HISTORY OF PRESENT ILLNESS: Jacqueline Greer is a 59 y.o. female who presents to the clinic today for:   HPI    Retina Follow Up    Patient presents with  CRVO/BRVO.  In right eye.  This started 4 weeks ago.  Severity is moderate.  I, the attending physician,  performed the HPI with the patient and updated documentation appropriately.          Comments    Patient here for 4 weeks retina follow up for BRVO with CME OD. Patient states vision doing good. No eye pain.        Last edited by Bernarda Caffey, MD on 07/31/2019 10:37 PM. (History)    pt states she is doing well, she is not having any problems with the injections   Referring physician: No referring provider defined for this encounter.  HISTORICAL INFORMATION:   Selected notes from the MEDICAL RECORD NUMBER Referred by Dr. Parke Simmers for concern of BRVO   CURRENT MEDICATIONS: No current outpatient medications on file. (Ophthalmic Drugs)   No current facility-administered medications for this visit. (Ophthalmic Drugs)   Current Outpatient Medications (Other)  Medication Sig  . omeprazole (PRILOSEC) 20 MG capsule Take 20 mg by mouth daily.     No current facility-administered medications for this visit. (Other)      REVIEW OF SYSTEMS: ROS    Positive for: Eyes   Negative for: Constitutional, Gastrointestinal, Neurological, Skin, Genitourinary, Musculoskeletal, HENT, Endocrine, Cardiovascular, Respiratory, Psychiatric, Heme/Lymph   Last edited by Theodore Demark, COA on 07/30/2019  1:48 PM. (History)       ALLERGIES No Known Allergies  PAST MEDICAL HISTORY Past Medical History:  Diagnosis Date  . Cataract    OU  . Hypertensive retinopathy    OU   Past Surgical History:  Procedure Laterality Date  . LAPAROSCOPIC TUBAL LIGATION  2003    FAMILY HISTORY Family History  Problem Relation  Age of Onset  . Diabetes Mother   . Kidney failure Father     SOCIAL HISTORY Social History   Tobacco Use  . Smoking status: Never Smoker  . Smokeless tobacco: Never Used  Substance Use Topics  . Alcohol use: No  . Drug use: No         OPHTHALMIC EXAM:  Base Eye Exam    Visual Acuity (Snellen - Linear)      Right Left   Dist cc 20/20 -2 20/20 -2   Correction: Glasses       Tonometry (Tonopen, 1:45 PM)      Right Left   Pressure 15 13       Pupils      Dark Light Shape React APD   Right 3 2 Round Brisk None   Left 3 2 Round Brisk None       Visual Fields (Counting fingers)      Left Right    Full Full       Extraocular Movement      Right Left    Full, Ortho Full, Ortho       Neuro/Psych    Oriented x3: Yes   Mood/Affect: Normal       Dilation    Both eyes: 1.0% Mydriacyl, 2.5% Phenylephrine @ 1:45 PM        Slit Lamp and Fundus Exam    Slit  Lamp Exam      Right Left   Lids/Lashes Normal Normal   Conjunctiva/Sclera Nasal and temporal Pinguecula, Melanosis Nasal and Pinguecula, Melanosis   Cornea trace Punctate epithelial erosions trace Punctate epithelial erosions   Anterior Chamber Deep and quiet Deep and quiet   Iris Round and dilated, No NVI Round and dilated, No NVI   Lens 2+ Nuclear sclerosis, 2+ Cortical cataract 2+ Nuclear sclerosis, 2+ Cortical cataract   Vitreous mild Vitreous syneresis mild Vitreous syneresis       Fundus Exam      Right Left   Disc Pink and Sharp, temporal PPP Pink and Sharp, temporal PPP   C/D Ratio 0.5 0.5   Macula Superior BRVO with central CME -- improved; stable resolition of SRF, superior IRH -- improved, residual clusters of DBH temporal macula Flat, Good foveal reflex, RPE mottling and clumping, No heme or edema, geographic area of hypopigmented RPE nasal and superior to fovea -- stable from prior   Vessels Vascular attenuation, Tortuous, AV crossing changes, sclerotic arterioles distal ST quad, superior  BRVO Vascular attenuation, Tortuous, AV crossing changes   Periphery Attached, DBH temporal periphery -- extension of BRVO -- improving, shallow schisis cavity IT periphery; no RT/RD Attached, no heme, WWP temporal and inferior quadrants          IMAGING AND PROCEDURES  Imaging and Procedures for @TODAY @  OCT, Retina - OU - Both Eyes       Right Eye Quality was good. Central Foveal Thickness: 276. Progression has been stable. Findings include intraretinal fluid, intraretinal hyper-reflective material, normal foveal contour, no SRF (Interval improvement in IRF/cystic changes; IT schisis caught on widefield).   Left Eye Quality was good. Central Foveal Thickness: 242. Progression has been stable. Findings include normal foveal contour, no IRF, no SRF, vitreomacular adhesion  (Partial PVD).   Notes *Images captured and stored on drive  Diagnosis / Impression:  OD: superior BRVO with CME, ?BRAO component -- Persistent IRF, interval improvement in IRF/cystic changes; IT schisis caught on widefield OS: NFP, no IRF/SRF OU  Clinical management:  See below  Abbreviations: NFP - Normal foveal profile. CME - cystoid macular edema. PED - pigment epithelial detachment. IRF - intraretinal fluid. SRF - subretinal fluid. EZ - ellipsoid zone. ERM - epiretinal membrane. ORA - outer retinal atrophy. ORT - outer retinal tubulation. SRHM - subretinal hyper-reflective material        Intravitreal Injection, Pharmacologic Agent - OD - Right Eye       Time Out 07/30/2019. 2:39 PM. Confirmed correct patient, procedure, site, and patient consented.   Anesthesia Topical anesthesia was used. Anesthetic medications included Lidocaine 2%, Proparacaine 0.5%.   Procedure A supplied needle was used.   Injection:  1.25 mg Bevacizumab (AVASTIN) SOLN   NDC: 70360-001-02, Lot: [id number] , Expiration date: 09/15/2019   Route: Intravitreal, Site: Right Eye, Waste: 0 mg  Post-op Post injection exam found  visual acuity of at least counting fingers. The patient tolerated the procedure well. There were no complications. The patient received written and verbal post procedure care education.                 ASSESSMENT/PLAN:    ICD-10-CM   1. 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 Retinal edema  H35.81 OCT, Retina -  OU - Both Eyes  4. Hypertensive retinopathy of both eyes  H35.033   5. 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 BRVO with CME OD  - s/p IVA OD #1 (10.05.20), #2 (11.02.20), #3 (11.30.20), #4 (01.07.21), #5 (02.05.21)  - initial exam and OCT findings suggestive of a BRAO component contributing  - BCVA 20/20 from 20/25  - OCT shows interval improvement in IRF  - recommend IVA OD #6 today, 03.05.21 for BRVO w/ CME  - pt wishes to proceed  - RBA of procedure discussed, questions answered  - informed consent obtained  - see procedure note  - Avastin informed consent obtain and scanned on 01.07.21  - F/U 4 weeks -- DFE/OCT/possible injection, widefield OCT through schisis  4. Hypertensive retinopathy OU  - pt not formally diagnosed with HTN  - discussed importance of tight BP control and its relation to problems #1-3 above  - pt is scheduled for appt with new PCP to establish care  - monitor  5. 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  - discussed diagnosis and progression  - not yet visually significant  - monitor for now  6. Retinoschisis OD  - shallow schisis inferotemporal periphery  - confirmed by widefield OCT today 3.5.21  - no RT/RD on scleral depression  - pt asymptomatic  - monitor   Ophthalmic Meds  Ordered this visit:  Meds ordered this encounter  Medications  . Bevacizumab (AVASTIN) SOLN 1.25 mg       Return in about 4 weeks (around 08/27/2019) for f/u BRVO with CME OD, DFE, OCT.  There are no Patient Instructions on file for this visit.   Explained the diagnoses, plan, and follow up with the patient and they expressed understanding.  Patient expressed understanding of the importance of proper follow up care.   This document serves as a record of services personally performed by Karie Chimera, MD, PhD. It was created on their behalf by Herby Abraham, COA, a certified ophthalmic assistant. The creation of this record is the provider's dictation and/or activities during the visit.    This document serves as a record of services personally performed by Karie Chimera, MD, PhD. It was created on their behalf by Laurian Brim, OA, an ophthalmic assistant. The creation of this record is the provider's dictation and/or activities during the visit.    Electronically signed by: Laurian Brim, OA 03.02.2021 10:56 PM   Karie Chimera, M.D., Ph.D. Diseases & Surgery of the Retina and Vitreous [facility]  I have reviewed the above documentation for accuracy and completeness, and I agree with the above. Karie Chimera, M.D., Ph.D. 07/31/19 10:56 PM     Abbreviations: M myopia (nearsighted); A astigmatism; H hyperopia (farsighted); P presbyopia; Mrx spectacle prescription;  CTL contact lenses; OD right eye; OS left eye; OU both eyes  XT exotropia; ET esotropia; PEK punctate epithelial keratitis; PEE punctate epithelial erosions; DES dry eye syndrome; MGD meibomian gland dysfunction; ATs artificial tears; PFAT's preservative free artificial tears; NSC nuclear sclerotic cataract; PSC posterior subcapsular cataract; ERM epi-retinal membrane; PVD posterior vitreous detachment; RD retinal detachment; DM diabetes mellitus; DR diabetic retinopathy; NPDR non-proliferative  diabetic retinopathy; PDR proliferative diabetic retinopathy; CSME clinically significant macular edema; DME diabetic macular edema; dbh dot blot hemorrhages; CWS cotton wool  spot; POAG primary open angle glaucoma; C/D cup-to-disc ratio; HVF humphrey visual field; GVF goldmann visual field; OCT optical coherence tomography; IOP intraocular pressure; BRVO Branch retinal vein occlusion; CRVO central retinal vein occlusion; CRAO central retinal artery occlusion; BRAO branch retinal artery occlusion; RT retinal tear; SB scleral buckle; PPV pars plana vitrectomy; VH Vitreous hemorrhage; PRP panretinal laser photocoagulation; IVK intravitreal kenalog; VMT vitreomacular traction; MH Macular hole;  NVD neovascularization of the disc; NVE neovascularization elsewhere; AREDS age related eye disease study; ARMD age related macular degeneration; POAG primary open angle glaucoma; EBMD epithelial/anterior basement membrane dystrophy; ACIOL anterior chamber intraocular lens; IOL intraocular lens; PCIOL posterior chamber intraocular lens; Phaco/IOL phacoemulsification with intraocular lens placement; [place] photorefractive keratectomy; LASIK laser assisted in situ keratomileusis; HTN hypertension; DM diabetes mellitus; COPD chronic obstructive pulmonary disease

## 2019-07-30 ENCOUNTER — Ambulatory Visit (INDEPENDENT_AMBULATORY_CARE_PROVIDER_SITE_OTHER): Payer: 59 | Admitting: Ophthalmology

## 2019-07-30 ENCOUNTER — Other Ambulatory Visit: Payer: Self-pay

## 2019-07-30 ENCOUNTER — Encounter (INDEPENDENT_AMBULATORY_CARE_PROVIDER_SITE_OTHER): Payer: Self-pay | Admitting: Ophthalmology

## 2019-07-30 DIAGNOSIS — H34831 Tributary (branch) retinal vein occlusion, right eye, with macular edema: Secondary | ICD-10-CM | POA: Diagnosis not present

## 2019-07-30 DIAGNOSIS — H3581 Retinal edema: Secondary | ICD-10-CM | POA: Diagnosis not present

## 2019-07-30 DIAGNOSIS — H35033 Hypertensive retinopathy, bilateral: Secondary | ICD-10-CM

## 2019-07-30 DIAGNOSIS — H25813 Combined forms of age-related cataract, bilateral: Secondary | ICD-10-CM

## 2019-07-30 DIAGNOSIS — H34231 Retinal artery branch occlusion, right eye: Secondary | ICD-10-CM

## 2019-07-30 DIAGNOSIS — H33101 Unspecified retinoschisis, right eye: Secondary | ICD-10-CM

## 2019-07-31 ENCOUNTER — Encounter (INDEPENDENT_AMBULATORY_CARE_PROVIDER_SITE_OTHER): Payer: Self-pay | Admitting: Ophthalmology

## 2019-07-31 MED ORDER — BEVACIZUMAB CHEMO INJECTION 1.25MG/0.05ML SYRINGE FOR KALEIDOSCOPE
1.2500 mg | INTRAVITREAL | Status: AC | PRN
  Administered 2019-07-31: 23:00:00 1.25 mg via INTRAVITREAL

## 2019-08-26 NOTE — Progress Notes (Signed)
[facility] - Clinic Note  08/27/2019     CHIEF COMPLAINT Patient presents for Retina Follow Up   HISTORY OF PRESENT ILLNESS: Jacqueline Greer is a 59 y.o. female who presents to the clinic today for:   HPI    Retina Follow Up    Patient presents with  CRVO/BRVO.  In right eye.  This started months ago.  Severity is mild.  Duration of 4 weeks.  Since onset it is stable.  I, the attending physician,  performed the HPI with the patient and updated documentation appropriately.          Comments    59 y/o female pt here for 4 wk f/u for BRVO w/CME OD.  No change in [place] OU, though pt's eyes feel tired today.  Denies pain, FOL, floaters.  No gtts.       Last edited by Rennis Chris, MD on 08/27/2019  2:33 PM. (History)    pt states her vision is "good"   Referring physician: No referring provider defined for this encounter.  HISTORICAL INFORMATION:   Selected notes from the MEDICAL RECORD NUMBER Referred by Dr. Krista Blue for concern of BRVO   CURRENT MEDICATIONS: No current outpatient medications on file. (Ophthalmic Drugs)   No current facility-administered medications for this visit. (Ophthalmic Drugs)   Current Outpatient Medications (Other)  Medication Sig  . omeprazole (PRILOSEC) 20 MG capsule Take 20 mg by mouth daily.     No current facility-administered medications for this visit. (Other)      REVIEW OF SYSTEMS: ROS    Positive for: Eyes   Negative for: Constitutional, Gastrointestinal, Neurological, Skin, Genitourinary, Musculoskeletal, HENT, Endocrine, Cardiovascular, Respiratory, Psychiatric, Allergic/Imm, Heme/Lymph   Last edited by Celine Mans, COA on 08/27/2019  1:36 PM. (History)       ALLERGIES No Known Allergies  PAST MEDICAL HISTORY Past Medical History:  Diagnosis Date  . Cataract    OU  . Hypertensive retinopathy    OU   Past Surgical History:  Procedure Laterality Date  . LAPAROSCOPIC TUBAL LIGATION  2003     FAMILY HISTORY Family History  Problem Relation Age of Onset  . Diabetes Mother   . Kidney failure Father     SOCIAL HISTORY Social History   Tobacco Use  . Smoking status: Never Smoker  . Smokeless tobacco: Never Used  Substance Use Topics  . Alcohol use: No  . Drug use: No         OPHTHALMIC EXAM:  Base Eye Exam    Visual Acuity (Snellen - Linear)      Right Left   Dist cc 20/25 - 20/20   Dist ph cc 20/20 -2    Correction: Glasses       Tonometry (Tonopen, 1:42 PM)      Right Left   Pressure 12 14       Pupils      Dark Light Shape React APD   Right 4 3.5 Round Minimal None   Left 4 3.5 Round Minimal None       Visual Fields (Counting fingers)      Left Right    Full Full       Extraocular Movement      Right Left    Full, Ortho Full, Ortho       Neuro/Psych    Oriented x3: Yes   Mood/Affect: Normal       Dilation    Both eyes: 1.0% Mydriacyl,  2.5% Phenylephrine @ 1:40 PM        Slit Lamp and Fundus Exam    Slit Lamp Exam      Right Left   Lids/Lashes Normal Normal   Conjunctiva/Sclera Nasal and temporal Pinguecula, Melanosis Nasal and Pinguecula, Melanosis   Cornea trace Punctate epithelial erosions trace Punctate epithelial erosions   Anterior Chamber Deep and quiet Deep and quiet   Iris Round and dilated, No NVI Round and dilated, No NVI   Lens 2+ Nuclear sclerosis, 2+ Cortical cataract 2+ Nuclear sclerosis, 2+ Cortical cataract   Vitreous mild Vitreous syneresis mild Vitreous syneresis       Fundus Exam      Right Left   Disc Pink and Sharp, temporal PPP Pink and Sharp, temporal PPP   C/D Ratio 0.5 0.5   Macula Superior BRVO with central CME -- improved; stable resolution of SRF, superior IRH -- improved, residual clusters of DBH temporal macula -- ?slightly increased Flat, Good foveal reflex, RPE mottling and clumping, No heme or edema, geographic area of hypopigmented RPE nasal and superior to fovea -- stable from prior    Vessels Vascular attenuation, Tortuous, AV crossing changes, sclerotic arterioles distal ST quad, superior BRVO Vascular attenuation, Tortuous, AV crossing changes   Periphery Attached, DBH temporal periphery -- extension of BRVO -- improving, shallow schisis cavity IT periphery (from 0700-0900); focal pigmented CR scar at 0130 midzone, no RT/RD, Attached, no heme, WWP temporal and inferior quadrants          IMAGING AND PROCEDURES  Imaging and Procedures for @TODAY @  OCT, Retina - OU - Both Eyes       Right Eye Quality was good. Central Foveal Thickness: 271. Progression has improved. Findings include intraretinal fluid, intraretinal hyper-reflective material, normal foveal contour, no SRF (Mild Interval improvement in IRF/cystic changes; IT schisis caught on widefield; partial PVD).   Left Eye Quality was good. Central Foveal Thickness: 237. Progression has been stable. Findings include normal foveal contour, no IRF, no SRF, vitreomacular adhesion  (Partial PVD).   Notes *Images captured and stored on drive  Diagnosis / Impression:  OD: superior BRVO with CME, ?BRAO component -- mild interval improvement in IRF/cystic changes; IT schisis caught on widefield; partial PVD OS: NFP, no IRF/SRF OU  Clinical management:  See below  Abbreviations: NFP - Normal foveal profile. CME - cystoid macular edema. PED - pigment epithelial detachment. IRF - intraretinal fluid. SRF - subretinal fluid. EZ - ellipsoid zone. ERM - epiretinal membrane. ORA - outer retinal atrophy. ORT - outer retinal tubulation. SRHM - subretinal hyper-reflective material        Intravitreal Injection, Pharmacologic Agent - OD - Right Eye       Time Out 08/27/2019. 1:44 PM. Confirmed correct patient, procedure, site, and patient consented.   Anesthesia Topical anesthesia was used. Anesthetic medications included Lidocaine 2%, Proparacaine 0.5%.   Procedure Preparation included 5% betadine to ocular surface,  eyelid speculum. A (32g) needle was used.   Injection:  1.25 mg Bevacizumab (AVASTIN) SOLN   NDC: 17408-144-81, Lot: [id number] , Expiration date: 10/26/2019   Route: Intravitreal, Site: Right Eye, Waste: 0 mL  Post-op Post injection exam found visual acuity of at least counting fingers. The patient tolerated the procedure well. There were no complications. The patient received written and verbal post procedure care education.                 ASSESSMENT/PLAN:    ICD-10-CM   1. Branch retinal vein occlusion  of right eye with macular edema  H34.8310 Intravitreal Injection, Pharmacologic Agent - OD - Right Eye    Bevacizumab (AVASTIN) SOLN 1.25 mg  2. 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 Retinal edema  H35.81 OCT, Retina - OU - Both Eyes  4. Hypertensive retinopathy of both eyes  H35.033   5. 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 BRVO with CME OD  - s/p IVA OD #1 (10.05.20), #2 (11.02.20), #3 (11.30.20), #4 (01.07.21), #5 (02.05.21), #6 (03.05.21)  - initial exam and OCT findings suggestive of a BRAO component contributing  - BCVA stable at 20/20   - OCT shows mild interval improvement in IRF  - exam shows mild interval increase in IRH/DBH  - recommend IVA OD #7 today, 04.02.21 for BRVO w/ CME  - pt wishes to proceed  - RBA of procedure discussed, questions answered  - informed consent obtained  - see procedure note  - Avastin informed consent obtain and scanned on 01.07.21  - F/U 4 weeks -- DFE/OCT/possible injection, widefield OCT through schisis  4. Hypertensive retinopathy OU  - pt not formally diagnosed with HTN  - discussed importance of tight BP control and its relation to problems #1-3 above  - pt is scheduled for appt with new PCP to establish care  - monitor  5. Mixed form age related cataract OU  - The symptoms of cataract, surgical options, and treatments and risks were  discussed with patient.  - discussed diagnosis and progression  - not yet visually significant  - monitor for now  6. Retinoschisis OD  - shallow schisis inferotemporal periphery -- stable  - stable on widefield OCT  - no RT/RD on scleral depression  - pt asymptomatic  - monitor   Ophthalmic Meds Ordered this visit:  Meds ordered this encounter  Medications  . Bevacizumab (AVASTIN) SOLN 1.25 mg       Return in about 4 weeks (around 09/24/2019) for f/u BRVO with CME OD, DFE, OCT.  There are no Patient Instructions on file for this visit.   Explained the diagnoses, plan, and follow up with the patient and they expressed understanding.  Patient expressed understanding of the importance of proper follow up care.   This document serves as a record of services personally performed by Karie Chimera, MD, PhD. It was created on their behalf by Laurian Brim, OA, an ophthalmic assistant. The creation of this record is the provider's dictation and/or activities during the visit.    Electronically signed by: Laurian Brim, OA 04.01.2021 4:21 PM   Karie Chimera, M.D., Ph.D. Diseases & Surgery of the Retina and Vitreous [facility]  I have reviewed the above documentation for accuracy and completeness, and I agree with the above. Karie Chimera, M.D., Ph.D. 08/27/19 4:21 PM   Abbreviations: M myopia (nearsighted); A astigmatism; H hyperopia (farsighted); P presbyopia; Mrx spectacle prescription;  CTL contact lenses; OD right eye; OS left eye; OU both eyes  XT exotropia; ET esotropia; PEK punctate epithelial keratitis; PEE punctate epithelial erosions; DES dry eye syndrome; MGD meibomian gland dysfunction; ATs artificial tears; PFAT's preservative free artificial tears; NSC nuclear sclerotic cataract; PSC posterior subcapsular cataract; ERM epi-retinal membrane; PVD posterior vitreous detachment; RD retinal detachment; DM diabetes mellitus; DR diabetic retinopathy;  NPDR non-proliferative diabetic retinopathy; PDR proliferative diabetic retinopathy; CSME clinically significant macular edema; DME  diabetic macular edema; dbh dot blot hemorrhages; CWS cotton wool spot; POAG primary open angle glaucoma; C/D cup-to-disc ratio; HVF humphrey visual field; GVF goldmann visual field; OCT optical coherence tomography; IOP intraocular pressure; BRVO Branch retinal vein occlusion; CRVO central retinal vein occlusion; CRAO central retinal artery occlusion; BRAO branch retinal artery occlusion; RT retinal tear; SB scleral buckle; PPV pars plana vitrectomy; VH Vitreous hemorrhage; PRP panretinal laser photocoagulation; IVK intravitreal kenalog; VMT vitreomacular traction; MH Macular hole;  NVD neovascularization of the disc; NVE neovascularization elsewhere; AREDS age related eye disease study; ARMD age related macular degeneration; POAG primary open angle glaucoma; EBMD epithelial/anterior basement membrane dystrophy; ACIOL anterior chamber intraocular lens; IOL intraocular lens; PCIOL posterior chamber intraocular lens; Phaco/IOL phacoemulsification with intraocular lens placement; [place] photorefractive keratectomy; LASIK laser assisted in situ keratomileusis; HTN hypertension; DM diabetes mellitus; COPD chronic obstructive pulmonary disease

## 2019-08-27 ENCOUNTER — Encounter (INDEPENDENT_AMBULATORY_CARE_PROVIDER_SITE_OTHER): Payer: Self-pay | Admitting: Ophthalmology

## 2019-08-27 ENCOUNTER — Ambulatory Visit (INDEPENDENT_AMBULATORY_CARE_PROVIDER_SITE_OTHER): Payer: 59 | Admitting: Ophthalmology

## 2019-08-27 DIAGNOSIS — H34231 Retinal artery branch occlusion, right eye: Secondary | ICD-10-CM

## 2019-08-27 DIAGNOSIS — H35033 Hypertensive retinopathy, bilateral: Secondary | ICD-10-CM

## 2019-08-27 DIAGNOSIS — H25813 Combined forms of age-related cataract, bilateral: Secondary | ICD-10-CM

## 2019-08-27 DIAGNOSIS — H33101 Unspecified retinoschisis, right eye: Secondary | ICD-10-CM

## 2019-08-27 DIAGNOSIS — H3581 Retinal edema: Secondary | ICD-10-CM

## 2019-08-27 DIAGNOSIS — H34831 Tributary (branch) retinal vein occlusion, right eye, with macular edema: Secondary | ICD-10-CM | POA: Diagnosis not present

## 2019-08-27 MED ORDER — BEVACIZUMAB CHEMO INJECTION 1.25MG/0.05ML SYRINGE FOR KALEIDOSCOPE
1.2500 mg | INTRAVITREAL | Status: AC | PRN
  Administered 2019-08-27: 15:00:00 1.25 mg via INTRAVITREAL

## 2019-09-24 ENCOUNTER — Encounter (INDEPENDENT_AMBULATORY_CARE_PROVIDER_SITE_OTHER): Payer: 59 | Admitting: Ophthalmology

## 2019-09-28 ENCOUNTER — Encounter (INDEPENDENT_AMBULATORY_CARE_PROVIDER_SITE_OTHER): Payer: Self-pay | Admitting: Ophthalmology

## 2019-09-28 ENCOUNTER — Ambulatory Visit (INDEPENDENT_AMBULATORY_CARE_PROVIDER_SITE_OTHER): Payer: 59 | Admitting: Ophthalmology

## 2019-09-28 DIAGNOSIS — H34831 Tributary (branch) retinal vein occlusion, right eye, with macular edema: Secondary | ICD-10-CM | POA: Diagnosis not present

## 2019-09-28 DIAGNOSIS — H34231 Retinal artery branch occlusion, right eye: Secondary | ICD-10-CM | POA: Diagnosis not present

## 2019-09-28 DIAGNOSIS — H33101 Unspecified retinoschisis, right eye: Secondary | ICD-10-CM

## 2019-09-28 DIAGNOSIS — H25813 Combined forms of age-related cataract, bilateral: Secondary | ICD-10-CM

## 2019-09-28 DIAGNOSIS — H3581 Retinal edema: Secondary | ICD-10-CM | POA: Diagnosis not present

## 2019-09-28 DIAGNOSIS — H35033 Hypertensive retinopathy, bilateral: Secondary | ICD-10-CM | POA: Diagnosis not present

## 2019-09-28 MED ORDER — BEVACIZUMAB CHEMO INJECTION 1.25MG/0.05ML SYRINGE FOR KALEIDOSCOPE
1.2500 mg | INTRAVITREAL | Status: AC | PRN
  Administered 2019-09-28: 1.25 mg via INTRAVITREAL

## 2019-09-28 NOTE — Progress Notes (Signed)
[facility] Note  09/28/2019     CHIEF COMPLAINT Patient presents for Retina Follow Up   HISTORY OF PRESENT ILLNESS: Jacqueline Greer is a 59 y.o. female who presents to the clinic today for:   HPI    Retina Follow Up    Patient presents with  CRVO/BRVO.  In right eye.  This started months ago.  Severity is mild.  Duration of 4.5 weeks.  Since onset it is stable.  I, the attending physician,  performed the HPI with the patient and updated documentation appropriately.          Comments    59 y/o female pt here for 4.5 wk f/u for BRVO w/mac edema OD.  No change in [place] OU.  Denies pain, FOL, floaters.  No gtts.       Last edited by Bernarda Caffey, MD on 09/28/2019 11:56 PM. (History)    pt states    Referring physician: No referring provider defined for this encounter.  HISTORICAL INFORMATION:   Selected notes from the MEDICAL RECORD NUMBER Referred by Dr. Parke Simmers for concern of BRVO   CURRENT MEDICATIONS: No current outpatient medications on file. (Ophthalmic Drugs)   No current facility-administered medications for this visit. (Ophthalmic Drugs)   Current Outpatient Medications (Other)  Medication Sig  . omeprazole (PRILOSEC) 20 MG capsule Take 20 mg by mouth daily.     No current facility-administered medications for this visit. (Other)      REVIEW OF SYSTEMS: ROS    Positive for: Eyes   Negative for: Constitutional, Gastrointestinal, Neurological, Skin, Genitourinary, Musculoskeletal, HENT, Endocrine, Cardiovascular, Respiratory, Psychiatric, Allergic/Imm, Heme/Lymph   Last edited by Matthew Folks, COA on 09/28/2019  2:54 PM. (History)       ALLERGIES No Known Allergies  PAST MEDICAL HISTORY Past Medical History:  Diagnosis Date  . Cataract    OU  . Hypertensive retinopathy    OU   Past Surgical History:  Procedure Laterality Date  . LAPAROSCOPIC TUBAL LIGATION  2003    FAMILY HISTORY Family History  Problem Relation  Age of Onset  . Diabetes Mother   . Kidney failure Father     SOCIAL HISTORY Social History   Tobacco Use  . Smoking status: Never Smoker  . Smokeless tobacco: Never Used  Substance Use Topics  . Alcohol use: No  . Drug use: No         OPHTHALMIC EXAM:  Base Eye Exam    Visual Acuity (Snellen - Linear)      Right Left   Dist cc 20/20 -2 20/20   Correction: Glasses       Tonometry (Tonopen, 2:55 PM)      Right Left   Pressure 14 12       Pupils      Dark Light Shape React APD   Right 4 3 Round Slow None   Left 4 3 Round Slow None       Visual Fields (Counting fingers)      Left Right    Full Full       Extraocular Movement      Right Left    Full, Ortho Full, Ortho       Neuro/Psych    Oriented x3: Yes   Mood/Affect: Normal       Dilation    Both eyes: 1.0% Mydriacyl, 2.5% Phenylephrine @ 2:55 PM        Slit Lamp and Fundus Exam  Slit Lamp Exam      Right Left   Lids/Lashes Normal Normal   Conjunctiva/Sclera Nasal and temporal Pinguecula, Melanosis Nasal and Pinguecula, Melanosis   Cornea trace Punctate epithelial erosions trace Punctate epithelial erosions   Anterior Chamber Deep and quiet Deep and quiet   Iris Round and dilated, No NVI Round and dilated, No NVI   Lens 2+ Nuclear sclerosis, 2+ Cortical cataract 2+ Nuclear sclerosis, 2+ Cortical cataract   Vitreous mild Vitreous syneresis, Posterior vitreous detachment mild Vitreous syneresis       Fundus Exam      Right Left   Disc Pink and Sharp, temporal PPP Pink and Sharp, temporal PPP   C/D Ratio 0.5 0.5   Macula Superior BRVO with central CME -- improved; stable resolution of SRF; superior IRH -- persistent; residual clusters of DBH temporal macula  Flat, Good foveal reflex, RPE mottling and clumping, No heme or edema, geographic area of hypopigmented RPE nasal and superior to fovea -- stable from prior   Vessels Vascular attenuation, Tortuous, AV crossing changes, ST arterioles with  severe attenuation, superior BRVO Vascular attenuation, Tortuous, AV crossing changes   Periphery Attached, DBH temporal periphery -- extension of BRVO -- improving, shallow schisis cavity IT periphery (from 0700-0900); focal pigmented CR scar at 0130 midzone, no RT/RD Attached, no heme, WWP temporal and inferior quadrants          IMAGING AND PROCEDURES  Imaging and Procedures for _0 @  OCT, Retina - OU - Both Eyes       Right Eye Quality was good. Central Foveal Thickness: 276. Progression has been stable. Findings include intraretinal fluid, intraretinal hyper-reflective material, normal foveal contour, no SRF (Persistent/cystic changes temporal and superior to fovea; IT schisis caught on widefield; partial PVD).   Left Eye Quality was good. Central Foveal Thickness: 238. Progression has been stable. Findings include normal foveal contour, no IRF, no SRF, vitreomacular adhesion  (Partial PVD).   Notes *Images captured and stored on drive  Diagnosis / Impression:  OD: superior BRVO with CME, ?BRAO component -- Persistent/cystic changes temporal and superior to fovea; IT schisis caught on widefield; partial PVD OS: NFP, no IRF/SRF OU  Clinical management:  See below  Abbreviations: NFP - Normal foveal profile. CME - cystoid macular edema. PED - pigment epithelial detachment. IRF - intraretinal fluid. SRF - subretinal fluid. EZ - ellipsoid zone. ERM - epiretinal membrane. ORA - outer retinal atrophy. ORT - outer retinal tubulation. SRHM - subretinal hyper-reflective material        Intravitreal Injection, Pharmacologic Agent - OD - Right Eye       Time Out 09/28/2019. 4:28 PM. Confirmed correct patient, procedure, site, and patient consented.   Anesthesia Topical anesthesia was used. Anesthetic medications included Lidocaine 2%, Proparacaine 0.5%.   Procedure Preparation included 5% betadine to ocular surface, eyelid speculum. A supplied (32g) needle was used.    Injection:  1.25 mg Bevacizumab (AVASTIN) SOLN   NDC: 36144-315-40, Lot: [id number] , Expiration date: 12/21/2019   Route: Intravitreal, Site: Right Eye, Waste: 0 mL  Post-op Post injection exam found visual acuity of at least counting fingers. The patient tolerated the procedure well. There were no complications. The patient received written and verbal post procedure care education.                 ASSESSMENT/PLAN:    ICD-10-CM   1. Branch retinal vein occlusion of right eye with macular edema  H34.8310 Intravitreal Injection, Pharmacologic Agent - OD -  Right Eye    Bevacizumab (AVASTIN) SOLN 1.25 mg  2. 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 Retinal edema  H35.81 OCT, Retina - OU - Both Eyes  4. Hypertensive retinopathy of both eyes  H35.033   5. 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 BRVO with CME OD  - s/p IVA OD #1 (10.05.20), #2 (11.02.20), #3 (11.30.20), #4 (01.07.21), #5 (02.05.21), #6 (03.05.21), #7 (04.02.21)  - initial exam and OCT findings suggestive of a BRAO component contributing  - BCVA stable at 20/20   - OCT shows mild persistent IRF/cystic changes  - exam shows persistent IRH/DBH  - recommend IVA OD #8 today, 05.04.21 for BRVO w/ CME  - pt wishes to proceed  - RBA of procedure discussed, questions answered  - informed consent obtained  - see procedure note  - Avastin informed consent obtain and scanned on 01.07.21  - F/U 4 weeks -- DFE/OCT/possible injection, widefield OCT through schisis  4. Hypertensive retinopathy OU  - pt not formally diagnosed with HTN  - discussed importance of tight BP control and its relation to problems #1-3 above  - pt is scheduled for appt with new PCP to establish care  - monitor  5. 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  - discussed  diagnosis and progression  - not yet visually significant  - monitor for now  6. Retinoschisis OD  - shallow schisis inferotemporal periphery -- stable  - stable on widefield OCT  - no RT/RD on scleral depression  - pt asymptomatic  - monitor   Ophthalmic Meds Ordered this visit:  Meds ordered this encounter  Medications  . Bevacizumab (AVASTIN) SOLN 1.25 mg       Return in about 4 weeks (around 10/26/2019) for f/u BRVO OD, DFE, OCT.  There are no Patient Instructions on file for this visit.   Explained the diagnoses, plan, and follow up with the patient and they expressed understanding.  Patient expressed understanding of the importance of proper follow up care.   This document serves as a record of services personally performed by Gardiner Sleeper, MD, PhD. It was created on their behalf by Estill Bakes, COT an ophthalmic technician. The creation of this record is the provider's dictation and/or activities during the visit.    Electronically signed by: Estill Bakes, COT 09/28/19 @ 12:00 AM   This document serves as a record of services personally performed by Gardiner Sleeper, MD, PhD. It was created on their behalf by Ernest Mallick, OA, an ophthalmic assistant. The creation of this record is the provider's dictation and/or activities during the visit.    Electronically signed by: Ernest Mallick, OA 05.04.2021 12:00 AM  Gardiner Sleeper, M.D., Ph.D. Diseases & Surgery of the Retina and [place] 09/28/2019   I have reviewed the above documentation for accuracy and completeness, and I agree with the above. Gardiner Sleeper, M.D., Ph.D. 09/29/19 12:00 AM   Abbreviations: M myopia (nearsighted); A astigmatism; H hyperopia (farsighted); P presbyopia; Mrx spectacle prescription;  CTL contact lenses; OD right eye; OS left eye; OU both eyes  XT exotropia; ET esotropia; PEK punctate epithelial keratitis; PEE punctate epithelial erosions; DES dry eye  syndrome; MGD meibomian gland dysfunction; ATs artificial tears; PFAT's preservative free  artificial tears; [place] nuclear sclerotic cataract; PSC posterior subcapsular cataract; ERM epi-retinal membrane; PVD posterior vitreous detachment; RD retinal detachment; DM diabetes mellitus; DR diabetic retinopathy; NPDR non-proliferative diabetic retinopathy; PDR proliferative diabetic retinopathy; CSME clinically significant macular edema; DME diabetic macular edema; dbh dot blot hemorrhages; CWS cotton wool spot; POAG primary open angle glaucoma; C/D cup-to-disc ratio; HVF humphrey visual field; GVF goldmann visual field; OCT optical coherence tomography; IOP intraocular pressure; BRVO Branch retinal vein occlusion; CRVO central retinal vein occlusion; CRAO central retinal artery occlusion; BRAO branch retinal artery occlusion; RT retinal tear; SB scleral buckle; PPV pars plana vitrectomy; VH Vitreous hemorrhage; PRP panretinal laser photocoagulation; IVK intravitreal kenalog; VMT vitreomacular traction; MH Macular hole;  NVD neovascularization of the disc; NVE neovascularization elsewhere; AREDS age related eye disease study; ARMD age related macular degeneration; POAG primary open angle glaucoma; EBMD epithelial/anterior basement membrane dystrophy; ACIOL anterior chamber intraocular lens; IOL intraocular lens; PCIOL posterior chamber intraocular lens; Phaco/IOL phacoemulsification with intraocular lens placement; [place] photorefractive keratectomy; LASIK laser assisted in situ keratomileusis; HTN hypertension; DM diabetes mellitus; COPD chronic obstructive pulmonary disease

## 2019-10-27 NOTE — Progress Notes (Addendum)
[facility] Note  10/28/2019     CHIEF COMPLAINT Patient presents for Retina Follow Up   HISTORY OF PRESENT ILLNESS: Jacqueline Greer is a 59 y.o. female who presents to the clinic today for:   HPI    Retina Follow Up    Patient presents with  CRVO/BRVO.  In right eye.  This started months ago.  Severity is mild.  Duration of 4 weeks.  Since onset it is stable.  I, the attending physician,  performed the HPI with the patient and updated documentation appropriately.          Comments    59 y/o female pt here for 4 wk f/u for BRVO w/mac edema OD.  No change in [place] OU.  Denies pain, FOL, floaters.  No gtts.       Last edited by Bernarda Caffey, MD on 10/28/2019  9:38 AM. (History)    pt states she had a short episode of periocular swelling around May 11, she states she did not try any treatments, but it resolved on its own   Referring physician: No referring provider defined for this encounter.  HISTORICAL INFORMATION:   Selected notes from the MEDICAL RECORD NUMBER Referred by Dr. Parke Simmers for concern of BRVO   CURRENT MEDICATIONS: No current outpatient medications on file. (Ophthalmic Drugs)   No current facility-administered medications for this visit. (Ophthalmic Drugs)   Current Outpatient Medications (Other)  Medication Sig  . omeprazole (PRILOSEC) 20 MG capsule Take 20 mg by mouth daily.     No current facility-administered medications for this visit. (Other)      REVIEW OF SYSTEMS: ROS    Positive for: Eyes   Negative for: Constitutional, Gastrointestinal, Neurological, Skin, Genitourinary, Musculoskeletal, HENT, Endocrine, Cardiovascular, Respiratory, Psychiatric, Allergic/Imm, Heme/Lymph   Last edited by Matthew Folks, COA on 10/28/2019  9:12 AM. (History)       ALLERGIES No Known Allergies  PAST MEDICAL HISTORY Past Medical History:  Diagnosis Date  . Cataract    OU  . Hypertensive retinopathy    OU   Past Surgical  History:  Procedure Laterality Date  . LAPAROSCOPIC TUBAL LIGATION  2003    FAMILY HISTORY Family History  Problem Relation Age of Onset  . Diabetes Mother   . Kidney failure Father     SOCIAL HISTORY Social History   Tobacco Use  . Smoking status: Never Smoker  . Smokeless tobacco: Never Used  Substance Use Topics  . Alcohol use: No  . Drug use: No         OPHTHALMIC EXAM:  Base Eye Exam    Visual Acuity (Snellen - Linear)      Right Left   Dist cc 20/25 20/20   Dist ph cc 20/20 -2    Correction: Glasses       Tonometry (Tonopen, 9:14 AM)      Right Left   Pressure 14 13       Pupils      Dark Light Shape React APD   Right 4 3 Round Slow None   Left 4 3 Round Slow None       Visual Fields (Counting fingers)      Left Right    Full Full       Extraocular Movement      Right Left    Full, Ortho Full, Ortho       Neuro/Psych    Oriented x3: Yes   Mood/Affect:  Normal       Dilation    Both eyes: 1.0% Mydriacyl, 2.5% Phenylephrine @ 9:14 AM        Slit Lamp and Fundus Exam    Slit Lamp Exam      Right Left   Lids/Lashes Normal Normal   Conjunctiva/Sclera Nasal and temporal Pinguecula, Melanosis Nasal and Pinguecula, Melanosis   Cornea trace Punctate epithelial erosions trace Punctate epithelial erosions   Anterior Chamber Deep and quiet Deep and quiet   Iris Round and dilated, No NVI Round and dilated, No NVI   Lens 2+ Nuclear sclerosis, 2+ Cortical cataract 2+ Nuclear sclerosis, 2+ Cortical cataract   Vitreous mild Vitreous syneresis, Posterior vitreous detachment mild Vitreous syneresis       Fundus Exam      Right Left   Disc Pink and Sharp, temporal PPP Pink and Sharp, temporal PPP   C/D Ratio 0.5 0.5   Macula Superior BRVO with central CME -- improved; stable resolution of SRF; superior IRH -- persistent; residual clusters of DBH temporal macula -- improving slowly Flat, Good foveal reflex, RPE mottling and clumping, No heme or  edema, geographic area of hypopigmented RPE nasal and superior to fovea -- stable from prior   Vessels Vascular attenuation, Tortuous, AV crossing changes, ST arterioles with severe attenuation, superior BRVO Vascular attenuation, Tortuous, AV crossing changes   Periphery Attached, DBH temporal periphery -- extension of BRVO -- improving, shallow schisis cavity IT periphery (from 0700-0900) - stable; focal pigmented CR scar at 0130 midzone, no RT/RD Attached, no heme, WWP temporal and inferior quadrants          IMAGING AND PROCEDURES  Imaging and Procedures for _0 @  OCT, Retina - OU - Both Eyes       Right Eye Quality was good. Central Foveal Thickness: 284. Progression has improved. Findings include intraretinal fluid, intraretinal hyper-reflective material, normal foveal contour, no SRF (Persistent/cystic changes temporal and superior to fovea--slightly improved; IT schisis caught on widefield; partial PVD).   Left Eye Quality was good. Central Foveal Thickness: 235. Progression has been stable. Findings include normal foveal contour, no IRF, no SRF, vitreomacular adhesion  (Partial PVD).   Notes *Images captured and stored on drive  Diagnosis / Impression:  OD: superior BRVO with CME, ?BRAO component -- Persistent/cystic changes temporal and superior to fovea--slightly improved; IT schisis caught on widefield; partial PVD OS: NFP, no IRF/SRF OU  Clinical management:  See below  Abbreviations: NFP - Normal foveal profile. CME - cystoid macular edema. PED - pigment epithelial detachment. IRF - intraretinal fluid. SRF - subretinal fluid. EZ - ellipsoid zone. ERM - epiretinal membrane. ORA - outer retinal atrophy. ORT - outer retinal tubulation. SRHM - subretinal hyper-reflective material        Intravitreal Injection, Pharmacologic Agent - OD - Right Eye       Time Out 10/28/2019. 9:42 AM. Confirmed correct patient, procedure, site, and patient consented.    Anesthesia Topical anesthesia was used. Anesthetic medications included Lidocaine 2%, Proparacaine 0.5%.   Procedure Preparation included 5% betadine to ocular surface, eyelid speculum. A supplied needle was used.   Injection:  1.25 mg Bevacizumab (AVASTIN) SOLN   NDC: 41638-453-64, Lot: [id number] , Expiration date: 12/29/2019   Route: Intravitreal, Site: Right Eye, Waste: 0 mL  Post-op Post injection exam found visual acuity of at least counting fingers. The patient tolerated the procedure well. There were no complications. The patient received written and verbal post procedure care education.  ASSESSMENT/PLAN:    ICD-10-CM   1. 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 Retinal edema  H35.81 OCT, Retina - OU - Both Eyes  4. Hypertensive retinopathy of both eyes  H35.033   5. 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 BRVO with CME OD  - s/p IVA OD #1 (10.05.20), #2 (11.02.20), #3 (11.30.20), #4 (01.07.21), #5 (02.05.21), #6 (03.05.21), #7 (04.02.21), #8 (05.04.21)  - initial exam and OCT findings suggestive of a BRAO component contributing  - BCVA stable at 20/20   - OCT shows persistent IRF/cystic changes -- improving slowly  - exam shows persistent IRH/DBH  - discussed reasons for slow progression and pt reports elevated BP readings at home which may be limiting our response to IVA  - BP in office 150-160 / 80-99  - recommend IVA OD #9 today, 06.03.21 for BRVO w/ CME  - pt wishes to proceed  - RBA of procedure discussed, questions answered  - informed consent obtained  - see procedure note  - Avastin informed consent obtain and scanned on 01.07.21  - F/U 4 weeks -- DFE/OCT/possible injection,  widefield OCT through schisis  4. Hypertensive retinopathy OU  - pt not formally diagnosed with HTN  - discussed importance of tight BP control and its relation to problems #1-3 above  - BP in office 150-160 / 80-99  - advised discussion with PCP and possible calibration of home BP cuff with PCP's  - will send notes to PCP  - monitor  5. 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  - discussed diagnosis and progression  - not yet visually significant  - monitor for now  6. Retinoschisis OD  - shallow schisis inferotemporal periphery -- stable  - stable on widefield OCT  - no RT/RD on scleral depression  - pt asymptomatic  - monitor   Ophthalmic Meds Ordered this visit:  Meds ordered this encounter  Medications  . Bevacizumab (AVASTIN) SOLN 1.25 mg       Return in about 4 weeks (around 11/25/2019) for f/u BRVO OD, DFE, OCT.  There are no Patient Instructions on file for this visit.   Explained the diagnoses, plan, and follow up with the patient and they expressed understanding.  Patient expressed understanding of the importance of proper follow up care.   This document serves as a record of services personally performed by Gardiner Sleeper, MD, PhD. It was created on their behalf by Ernest Mallick, OA, an ophthalmic assistant. The creation of this record is the provider's dictation and/or activities during the visit.    Electronically signed by: Ernest Mallick, OA 06.02.2021 10:09 AM  Gardiner Sleeper, M.D., Ph.D. Diseases & Surgery of the Retina and Vitreous [facility]  I have reviewed the above documentation for accuracy and completeness, and I agree with the above. Gardiner Sleeper, M.D., Ph.D. 10/28/19 10:09 AM    Abbreviations: M myopia (nearsighted); A astigmatism; H hyperopia (farsighted); P presbyopia; Mrx spectacle prescription;  CTL contact lenses; OD  right eye; OS left eye; OU both eyes  XT exotropia; ET esotropia; PEK punctate  epithelial keratitis; PEE punctate epithelial erosions; DES dry eye syndrome; MGD meibomian gland dysfunction; ATs artificial tears; PFAT's preservative free artificial tears; [place] nuclear sclerotic cataract; PSC posterior subcapsular cataract; ERM epi-retinal membrane; PVD posterior vitreous detachment; RD retinal detachment; DM diabetes mellitus; DR diabetic retinopathy; NPDR non-proliferative diabetic retinopathy; PDR proliferative diabetic retinopathy; CSME clinically significant macular edema; DME diabetic macular edema; dbh dot blot hemorrhages; CWS cotton wool spot; POAG primary open angle glaucoma; C/D cup-to-disc ratio; HVF humphrey visual field; GVF goldmann visual field; OCT optical coherence tomography; IOP intraocular pressure; BRVO Branch retinal vein occlusion; CRVO central retinal vein occlusion; CRAO central retinal artery occlusion; BRAO branch retinal artery occlusion; RT retinal tear; SB scleral buckle; PPV pars plana vitrectomy; VH Vitreous hemorrhage; PRP panretinal laser photocoagulation; IVK intravitreal kenalog; VMT vitreomacular traction; MH Macular hole;  NVD neovascularization of the disc; NVE neovascularization elsewhere; AREDS age related eye disease study; ARMD age related macular degeneration; POAG primary open angle glaucoma; EBMD epithelial/anterior basement membrane dystrophy; ACIOL anterior chamber intraocular lens; IOL intraocular lens; PCIOL posterior chamber intraocular lens; Phaco/IOL phacoemulsification with intraocular lens placement; [place] photorefractive keratectomy; LASIK laser assisted in situ keratomileusis; HTN hypertension; DM diabetes mellitus; COPD chronic obstructive pulmonary disease

## 2019-10-28 ENCOUNTER — Encounter (INDEPENDENT_AMBULATORY_CARE_PROVIDER_SITE_OTHER): Payer: Self-pay | Admitting: Ophthalmology

## 2019-10-28 ENCOUNTER — Other Ambulatory Visit: Payer: Self-pay

## 2019-10-28 ENCOUNTER — Ambulatory Visit (INDEPENDENT_AMBULATORY_CARE_PROVIDER_SITE_OTHER): Payer: 59 | Admitting: Ophthalmology

## 2019-10-28 VITALS — BP 155/86 | HR 88

## 2019-10-28 DIAGNOSIS — H34831 Tributary (branch) retinal vein occlusion, right eye, with macular edema: Secondary | ICD-10-CM | POA: Diagnosis not present

## 2019-10-28 DIAGNOSIS — H33101 Unspecified retinoschisis, right eye: Secondary | ICD-10-CM

## 2019-10-28 DIAGNOSIS — H3581 Retinal edema: Secondary | ICD-10-CM

## 2019-10-28 DIAGNOSIS — H25813 Combined forms of age-related cataract, bilateral: Secondary | ICD-10-CM

## 2019-10-28 DIAGNOSIS — H35033 Hypertensive retinopathy, bilateral: Secondary | ICD-10-CM

## 2019-10-28 DIAGNOSIS — H34231 Retinal artery branch occlusion, right eye: Secondary | ICD-10-CM

## 2019-10-28 MED ORDER — BEVACIZUMAB CHEMO INJECTION 1.25MG/0.05ML SYRINGE FOR KALEIDOSCOPE
1.2500 mg | INTRAVITREAL | Status: AC | PRN
  Administered 2019-10-28: 1.25 mg via INTRAVITREAL

## 2019-12-03 ENCOUNTER — Encounter (INDEPENDENT_AMBULATORY_CARE_PROVIDER_SITE_OTHER): Payer: 59 | Admitting: Ophthalmology

## 2019-12-15 NOTE — Progress Notes (Signed)
[facility]  12/16/2019     CHIEF COMPLAINT Patient presents for Retina Follow Up   HISTORY OF PRESENT ILLNESS: Jacqueline Greer is a 59 y.o. female who presents to the clinic today for:   HPI    Retina Follow Up    Patient presents with  CRVO/BRVO.  In right eye.  Duration of 7 weeks.  Since onset it is stable.  I, the attending physician,  performed the HPI with the patient and updated documentation appropriately.          Comments    7 week follow up BRVO OD- Vision appears stable OU since last visit.       Last edited by Bernarda Caffey, MD on 12/16/2019  3:49 PM. (History)    pt states she had a short episode of periocular swelling around May 11, she states she did not try any treatments, but it resolved on its own   Referring physician: Julian Hy, PA-C [address] ,  [place] [address]  HISTORICAL INFORMATION:   Selected notes from the MEDICAL RECORD NUMBER Referred by Dr. Parke Simmers for concern of BRVO   CURRENT MEDICATIONS: No current outpatient medications on file. (Ophthalmic Drugs)   No current facility-administered medications for this visit. (Ophthalmic Drugs)   Current Outpatient Medications (Other)  Medication Sig   omeprazole (PRILOSEC) 20 MG capsule Take 20 mg by mouth daily.     No current facility-administered medications for this visit. (Other)      REVIEW OF SYSTEMS: ROS    Positive for: Gastrointestinal, Eyes   Negative for: Constitutional, Neurological, Skin, Genitourinary, Musculoskeletal, HENT, Endocrine, Cardiovascular, Respiratory, Psychiatric, Allergic/Imm, Heme/Lymph   Last edited by Leonie Douglas, COA on 12/16/2019  9:57 AM. (History)       ALLERGIES No Known Allergies  PAST MEDICAL HISTORY Past Medical History:  Diagnosis Date   Cataract    OU   Hypertensive retinopathy    OU   Past Surgical History:  Procedure Laterality Date   LAPAROSCOPIC TUBAL LIGATION  2003     FAMILY HISTORY Family History  Problem Relation Age of Onset   Diabetes Mother    Kidney failure Father     SOCIAL HISTORY Social History   Tobacco Use   Smoking status: Never Smoker   Smokeless tobacco: Never Used  Vaping Use   Vaping Use: Never used  Substance Use Topics   Alcohol use: No   Drug use: No         OPHTHALMIC EXAM:  Base Eye Exam    Visual Acuity (Snellen - Linear)      Right Left   Dist cc 20/25 20/20 -2   Dist ph cc 20/25 +1    Correction: Glasses       Tonometry (Tonopen, 10:02 AM)      Right Left   Pressure 16 19       Pupils      Dark Light Shape React APD   Right 4 3 Round Brisk None   Left 4 3 Round Brisk None       Visual Fields (Counting fingers)      Left Right    Full Full       Extraocular Movement      Right Left    Full Full       Neuro/Psych    Oriented x3: Yes   Mood/Affect: Normal       Dilation    Both eyes:  1.0% Mydriacyl, 2.5% Phenylephrine @ 10:03 AM        Slit Lamp and Fundus Exam    Slit Lamp Exam      Right Left   Lids/Lashes Normal Normal   Conjunctiva/Sclera Nasal and temporal Pinguecula, Melanosis Nasal and Pinguecula, Melanosis   Cornea trace Punctate epithelial erosions trace Punctate epithelial erosions   Anterior Chamber Deep and quiet Deep and quiet   Iris Round and dilated, No NVI Round and dilated, No NVI   Lens 2+ Nuclear sclerosis, 2+ Cortical cataract 2+ Nuclear sclerosis, 2+ Cortical cataract   Vitreous mild Vitreous syneresis, Posterior vitreous detachment mild Vitreous syneresis       Fundus Exam      Right Left   Disc Pink and Sharp, temporal PPP Pink and Sharp, temporal PPP   C/D Ratio 0.5 0.5   Macula Superior BRVO with central CME -- increased from prior; superior IRH -- persistent; residual clusters of DBH temporal macula --slightly worse.  CWS supeior mac Flat, Good foveal reflex, RPE mottling and clumping, No heme or edema, geographic area of hypopigmented RPE  nasal and superior to fovea -- stable from prior   Vessels Vascular attenuation, Tortuous, AV crossing changes, ST arterioles with severe attenuation, superior BRVO Vascular attenuation, Tortuous, AV crossing changes   Periphery Attached, DBH temporal periphery -- extension of BRVO, shallow schisis cavity IT periphery (from 0700-0900) - stable; focal pigmented CR scar at 0130 midzone, no RT/RD Attached, no heme, WWP temporal and inferior quadrants          IMAGING AND PROCEDURES  Imaging and Procedures for _0 @  OCT, Retina - OU - Both Eyes       Right Eye Quality was good. Central Foveal Thickness: 368. Progression has worsened. Findings include intraretinal fluid, intraretinal hyper-reflective material, normal foveal contour, no SRF (Interval increase in IRF/CME.  Schisis stable from prior.).   Left Eye Quality was good. Central Foveal Thickness: 236. Progression has been stable. Findings include normal foveal contour, no IRF, no SRF (Partial PVD).   Notes *Images captured and stored on drive  Diagnosis / Impression:  OD: superior BRVO with CME, ?BRAO component -- Interval increase in IRF/CME.  Schisis stable from prior. OS: NFP, no IRF/SRF  Clinical management:  See below  Abbreviations: NFP - Normal foveal profile. CME - cystoid macular edema. PED - pigment epithelial detachment. IRF - intraretinal fluid. SRF - subretinal fluid. EZ - ellipsoid zone. ERM - epiretinal membrane. ORA - outer retinal atrophy. ORT - outer retinal tubulation. SRHM - subretinal hyper-reflective material        Intravitreal Injection, Pharmacologic Agent - OD - Right Eye       Time Out 12/16/2019. 11:14 AM. Confirmed correct patient, procedure, site, and patient consented.   Anesthesia Topical anesthesia was used. Anesthetic medications included Lidocaine 2%, Proparacaine 0.5%.   Procedure Preparation included 5% betadine to ocular surface, eyelid speculum. A supplied needle was used.    Injection:  1.25 mg Bevacizumab (AVASTIN) SOLN   NDC: 01751-025-85, Lot: [id number] , Expiration date: 01/19/2020   Route: Intravitreal, Site: Right Eye, Waste: 0 mL  Post-op Post injection exam found visual acuity of at least counting fingers. The patient tolerated the procedure well. There were no complications. The patient received written and verbal post procedure care education.                 ASSESSMENT/PLAN:    ICD-10-CM   1. Branch retinal vein occlusion of right eye with macular edema  W58.0998 Intravitreal Injection, Pharmacologic Agent - OD - Right Eye    Bevacizumab (AVASTIN) SOLN 1.25 mg  2. 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 Retinal edema  H35.81 OCT, Retina - OU - Both Eyes  4. Hypertensive retinopathy of both eyes  H35.033   5. 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 BRVO with CME OD  - F/u delayed from 4 wks to 7  - s/p IVA OD #1 (10.05.20), #2 (11.02.20), #3 (11.30.20), #4 (01.07.21), #5 (02.05.21), #6 (03.05.21), #7 (04.02.21), #8 (05.04.21), #9 (06.03.21)  - initial exam and OCT findings suggestive of a BRAO component contributing  - BCVA 20/25  - OCT shows Interval increase in IRF/CME. Schisis stable from prior.  - exam shows persistent IRH/DBH -- mildly increased  - recommend IVA OD #10 today, 07.22.21 for BRVO w/ CME  - pt wishes to proceed  - RBA of procedure discussed, questions answered  - informed consent obtained  - see procedure note  - Avastin informed consent obtain and scanned on 01.07.21  - F/U 4 weeks -- DFE/OCT/possible injection, widefield OCT through schisis  4. Hypertensive retinopathy OU  - pt not formally diagnosed with HTN  - discussed importance of tight BP control and its relation to problems #1-3 above  - advised discussion with PCP and possible calibration of home BP cuff with PCP's  - monitor  5. 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  - discussed diagnosis and progression  - not yet visually significant  - monitor for now  6. Retinoschisis OD  - shallow schisis inferotemporal periphery -- stable  - stable on widefield OCT  - no RT/RD on scleral depression  - pt asymptomatic  - monitor   Ophthalmic Meds Ordered this visit:  Meds ordered this encounter  Medications   Bevacizumab (AVASTIN) SOLN 1.25 mg       Return in about 4 weeks (around 01/13/2020) for 4 wk f/u for BRVO OD w/DFE/OCT/poss. inj..  There are no Patient Instructions on file for this visit.   Explained the diagnoses, plan, and follow up with the patient and they expressed understanding.  Patient expressed understanding of the importance of proper follow up care.   This document serves as a record of services personally performed by Gardiner Sleeper, MD, PhD. It was created on their behalf by Leonie Douglas, an ophthalmic technician. The creation of this record is the provider's dictation and/or activities during the visit.    Electronically signed by: Leonie Douglas COA, 12/16/19  3:55 PM   This document serves as a record of services personally performed by Gardiner Sleeper, MD, PhD. It was created on their behalf by Estill Bakes, COT an ophthalmic technician. The creation of this record is the provider's dictation and/or activities during the visit.    Electronically signed by: Estill Bakes, COT 12/16/19 @ 3:55 PM  Gardiner Sleeper, M.D., Ph.D. Diseases & Surgery of the Retina and [place] 12/16/19  I have reviewed the above documentation for accuracy and completeness, and I agree with the above. Gardiner Sleeper, M.D., Ph.D. 12/16/19 4:28 PM   Abbreviations: M myopia (nearsighted); A astigmatism; H hyperopia (farsighted); P presbyopia; Mrx spectacle prescription;  CTL contact lenses; OD right eye; OS left eye; OU  both  eyes  XT exotropia; ET esotropia; PEK punctate epithelial keratitis; PEE punctate epithelial erosions; DES dry eye syndrome; MGD meibomian gland dysfunction; ATs artificial tears; PFAT's preservative free artificial tears; [place] nuclear sclerotic cataract; PSC posterior subcapsular cataract; ERM epi-retinal membrane; PVD posterior vitreous detachment; RD retinal detachment; DM diabetes mellitus; DR diabetic retinopathy; NPDR non-proliferative diabetic retinopathy; PDR proliferative diabetic retinopathy; CSME clinically significant macular edema; DME diabetic macular edema; dbh dot blot hemorrhages; CWS cotton wool spot; POAG primary open angle glaucoma; C/D cup-to-disc ratio; HVF humphrey visual field; GVF goldmann visual field; OCT optical coherence tomography; IOP intraocular pressure; BRVO Branch retinal vein occlusion; CRVO central retinal vein occlusion; CRAO central retinal artery occlusion; BRAO branch retinal artery occlusion; RT retinal tear; SB scleral buckle; PPV pars plana vitrectomy; VH Vitreous hemorrhage; PRP panretinal laser photocoagulation; IVK intravitreal kenalog; VMT vitreomacular traction; MH Macular hole;  NVD neovascularization of the disc; NVE neovascularization elsewhere; AREDS age related eye disease study; ARMD age related macular degeneration; POAG primary open angle glaucoma; EBMD epithelial/anterior basement membrane dystrophy; ACIOL anterior chamber intraocular lens; IOL intraocular lens; PCIOL posterior chamber intraocular lens; Phaco/IOL phacoemulsification with intraocular lens placement; [place] photorefractive keratectomy; LASIK laser assisted in situ keratomileusis; HTN hypertension; DM diabetes mellitus; COPD chronic obstructive pulmonary disease

## 2019-12-16 ENCOUNTER — Other Ambulatory Visit: Payer: Self-pay

## 2019-12-16 ENCOUNTER — Encounter (INDEPENDENT_AMBULATORY_CARE_PROVIDER_SITE_OTHER): Payer: Self-pay | Admitting: Ophthalmology

## 2019-12-16 ENCOUNTER — Ambulatory Visit (INDEPENDENT_AMBULATORY_CARE_PROVIDER_SITE_OTHER): Payer: 59 | Admitting: Ophthalmology

## 2019-12-16 DIAGNOSIS — H33101 Unspecified retinoschisis, right eye: Secondary | ICD-10-CM

## 2019-12-16 DIAGNOSIS — H34831 Tributary (branch) retinal vein occlusion, right eye, with macular edema: Secondary | ICD-10-CM | POA: Diagnosis not present

## 2019-12-16 DIAGNOSIS — H3581 Retinal edema: Secondary | ICD-10-CM

## 2019-12-16 DIAGNOSIS — H34231 Retinal artery branch occlusion, right eye: Secondary | ICD-10-CM

## 2019-12-16 DIAGNOSIS — H35033 Hypertensive retinopathy, bilateral: Secondary | ICD-10-CM

## 2019-12-16 DIAGNOSIS — H25813 Combined forms of age-related cataract, bilateral: Secondary | ICD-10-CM

## 2019-12-16 MED ORDER — BEVACIZUMAB CHEMO INJECTION 1.25MG/0.05ML SYRINGE FOR KALEIDOSCOPE
1.2500 mg | INTRAVITREAL | Status: AC | PRN
  Administered 2019-12-16: 1.25 mg via INTRAVITREAL

## 2020-01-10 NOTE — Progress Notes (Signed)
[facility] Note  01/13/2020     CHIEF COMPLAINT Patient presents for Retina Follow Up   HISTORY OF PRESENT ILLNESS: Jacqueline Greer is a 59 y.o. female who presents to the clinic today for:   HPI    Retina Follow Up    Patient presents with  CRVO/BRVO.  In right eye.  Duration of 4 weeks.  Since onset it is stable.  I, the attending physician,  performed the HPI with the patient and updated documentation appropriately.          Comments    4 week follow up BRVO OD- Vision is stable since last visit OU.  Denies using eye drops.       Last edited by Bernarda Caffey, MD on 01/13/2020  9:11 AM. (History)    pt states   Referring physician: [place], [place], [address],  [place] [address]  HISTORICAL INFORMATION:   Selected notes from the MEDICAL RECORD NUMBER Referred by Dr. Parke Simmers for concern of BRVO   CURRENT MEDICATIONS: No current outpatient medications on file. (Ophthalmic Drugs)   No current facility-administered medications for this visit. (Ophthalmic Drugs)   Current Outpatient Medications (Other)  Medication Sig  . omeprazole (PRILOSEC) 20 MG capsule Take 20 mg by mouth daily.     No current facility-administered medications for this visit. (Other)      REVIEW OF SYSTEMS: ROS    Positive for: Gastrointestinal, Eyes   Negative for: Constitutional, Neurological, Skin, Genitourinary, Musculoskeletal, HENT, Endocrine, Cardiovascular, Respiratory, Psychiatric, Allergic/Imm, Heme/Lymph   Last edited by Leonie Douglas, COA on 01/13/2020  8:19 AM. (History)       ALLERGIES No Known Allergies  PAST MEDICAL HISTORY Past Medical History:  Diagnosis Date  . Cataract    OU  . Hypertensive retinopathy    OU   Past Surgical History:  Procedure Laterality Date  . LAPAROSCOPIC TUBAL LIGATION  2003    FAMILY HISTORY Family History  Problem Relation Age of Onset  . Diabetes Mother   . Kidney failure Father      SOCIAL HISTORY Social History   Tobacco Use  . Smoking status: Never Smoker  . Smokeless tobacco: Never Used  Vaping Use  . Vaping Use: Never used  Substance Use Topics  . Alcohol use: No  . Drug use: No         OPHTHALMIC EXAM:  Base Eye Exam    Visual Acuity (Snellen - Linear)      Right Left   Dist cc 20/20 -2 20/20 -2   Correction: Glasses       Tonometry (Tonopen, 8:23 AM)      Right Left   Pressure 13 15       Pupils      Dark Light Shape React APD   Right 4 3 Round Brisk None   Left 4 3 Round Brisk None       Visual Fields (Counting fingers)      Left Right    Full Full       Extraocular Movement      Right Left    Full Full       Neuro/Psych    Oriented x3: Yes   Mood/Affect: Normal       Dilation    Both eyes: 1.0% Mydriacyl, 2.5% Phenylephrine @ 8:23 AM        Slit Lamp and Fundus Exam    Slit Lamp Exam  Right Left   Lids/Lashes Normal Normal   Conjunctiva/Sclera Nasal and temporal Pinguecula, Melanosis Nasal and Pinguecula, Melanosis   Cornea trace Punctate epithelial erosions trace Punctate epithelial erosions   Anterior Chamber Deep and quiet Deep and quiet   Iris Round and dilated, No NVI Round and dilated, No NVI   Lens 2+ Nuclear sclerosis, 2+ Cortical cataract 2+ Nuclear sclerosis, 2+ Cortical cataract   Vitreous mild Vitreous syneresis, Posterior vitreous detachment mild Vitreous syneresis       Fundus Exam      Right Left   Disc Pink and Sharp, temporal PPP Pink and Sharp, temporal PPP   C/D Ratio 0.5 0.5   Macula Superior BRVO with central CME -- improved from prior; superior IRH -- persistent; residual clusters of DBH temporal macula --slightly improved.  CWS supeior mac -- resolved Flat, Good foveal reflex, RPE mottling and clumping, No heme or edema, geographic area of hypopigmented RPE nasal and superior to fovea -- stable from prior   Vessels Vascular attenuation, Tortuous, AV crossing changes, ST arterioles  with severe attenuation and sclerosis, superior BRVO/BRAO Vascular attenuation, Tortuous, AV crossing changes   Periphery Attached, DBH temporal periphery -- extension of BRVO, shallow schisis cavity IT periphery (from 0700-0900) - stable; focal pigmented CR scar at 0130 midzone, no RT/RD Attached, no heme, WWP temporal and inferior quadrants          IMAGING AND PROCEDURES  Imaging and Procedures for _0 @  OCT, Retina - OU - Both Eyes       Right Eye Quality was good. Central Foveal Thickness: 288. Progression has improved. Findings include intraretinal fluid, intraretinal hyper-reflective material, normal foveal contour, no SRF (Interval improvement in IRF superior macula, partial PVD, Schisis stable from prior).   Left Eye Quality was good. Central Foveal Thickness: 234. Progression has been stable. Findings include normal foveal contour, no IRF, no SRF (Partial PVD; shallow schisis IT quad caught on widefield).   Notes *Images captured and stored on drive  Diagnosis / Impression:  OD: superior BRVO with CME, ?BRAO component --Interval improvement in IRF superior macula, partial PVD, Schisis stable from prior  OS: NFP, no IRF/SRF; shallow schisis IT quad caught on widefield  Clinical management:  See below  Abbreviations: NFP - Normal foveal profile. CME - cystoid macular edema. PED - pigment epithelial detachment. IRF - intraretinal fluid. SRF - subretinal fluid. EZ - ellipsoid zone. ERM - epiretinal membrane. ORA - outer retinal atrophy. ORT - outer retinal tubulation. SRHM - subretinal hyper-reflective material        Intravitreal Injection, Pharmacologic Agent - OD - Right Eye       Time Out 01/13/2020. 9:16 AM. Confirmed correct patient, procedure, site, and patient consented.   Anesthesia Topical anesthesia was used. Anesthetic medications included Lidocaine 2%, Proparacaine 0.5%.   Procedure Preparation included 5% betadine to ocular surface, eyelid speculum.  A (32g) needle was used.   Injection:  1.25 mg Bevacizumab (AVASTIN) SOLN   NDC: 70360-001-02, Lot: [id number], Expiration date: 02/07/2020   Route: Intravitreal, Site: Right Eye, Waste: 0.05 mL  Post-op Post injection exam found visual acuity of at least counting fingers. The patient tolerated the procedure well. There were no complications. The patient received written and verbal post procedure care education.                 ASSESSMENT/PLAN:    ICD-10-CM   1. Branch retinal vein occlusion of right eye with macular edema  H34.8310 Intravitreal Injection, Pharmacologic Agent -  OD - Right Eye    Bevacizumab (AVASTIN) SOLN 1.25 mg  2. 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 Retinal edema  H35.81 OCT, Retina - OU - Both Eyes  4. Essential hypertension  I10   5. Hypertensive retinopathy of both eyes  H35.033   6. 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 BRVO with CME OD  - s/p IVA OD #1 (10.05.20), #2 (11.02.20), #3 (11.30.20), #4 (01.07.21), #5 (02.05.21), #6 (03.05.21), #7 (04.02.21), #8 (05.04.21), #9 (06.03.21), #10 (07.22.21)  - initial exam and OCT findings suggestive of a BRAO component contributing  - BCVA 20/20  - OCT shows Interval improvement in IRF superior macula, schisis stable from prior.  - exam shows interval improvement in IRH/DBH  - recommend IVA OD #11 today, 08.19.21 for BRVO w/ CME  - pt wishes to proceed  - RBA of procedure discussed, questions answered  - informed consent obtained  - see procedure note  - Avastin informed consent obtain and scanned on 01.07.21  - F/U 4 weeks -- DFE/OCT/FA (optos, transit OD)/possible injection, widefield OCT through schisis  4,5. Hypertensive retinopathy OU  - pt not formally diagnosed with HTN  - discussed importance of tight BP control and its relation to problems #1-3 above  - advised discussion with PCP and possible calibration of  home BP cuff with PCP's  - monitor  6. 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  - discussed diagnosis and progression  - not yet visually significant  - monitor for now  7. Retinoschisis OU  - shallow schisis inferotemporal periphery -- stable  - stable on widefield OCT  - no RT/RD on scleral depression  - pt asymptomatic  - monitor   Ophthalmic Meds Ordered this visit:  Meds ordered this encounter  Medications  . Bevacizumab (AVASTIN) SOLN 1.25 mg       Return in about 4 weeks (around 02/10/2020) for f/u BRVO OD, DFE, OCT.  There are no Patient Instructions on file for this visit.   Explained the diagnoses, plan, and follow up with the patient and they expressed understanding.  Patient expressed understanding of the importance of proper follow up care.   This document serves as a record of services personally performed by Gardiner Sleeper, MD, PhD. It was created on their behalf by Leonie Douglas, an ophthalmic technician. The creation of this record is the provider's dictation and/or activities during the visit.    Electronically signed by: Leonie Douglas COA, 01/13/20  9:35 AM   This document serves as a record of services personally performed by Gardiner Sleeper, MD, PhD. It was created on their behalf by San Jetty. Owens Shark, OA an ophthalmic technician. The creation of this record is the provider's dictation and/or activities during the visit.    Electronically signed by: San Jetty. Owens Shark, [place] 08.19.2021 9:35 AM   Gardiner Sleeper, M.D., Ph.D. Diseases & Surgery of the Retina and Vitreous [facility]  I have reviewed the above documentation for accuracy and completeness, and I agree with the above. Gardiner Sleeper, M.D., Ph.D. 01/13/20 9:35 AM   Abbreviations: M myopia (nearsighted); A astigmatism; H hyperopia (farsighted); P presbyopia; Mrx spectacle  prescription;  CTL contact lenses; OD right eye; OS left eye; OU both eyes  XT exotropia;  ET esotropia; PEK punctate epithelial keratitis; PEE punctate epithelial erosions; DES dry eye syndrome; MGD meibomian gland dysfunction; ATs artificial tears; PFAT's preservative free artificial tears; [place] nuclear sclerotic cataract; PSC posterior subcapsular cataract; ERM epi-retinal membrane; PVD posterior vitreous detachment; RD retinal detachment; DM diabetes mellitus; DR diabetic retinopathy; NPDR non-proliferative diabetic retinopathy; PDR proliferative diabetic retinopathy; CSME clinically significant macular edema; DME diabetic macular edema; dbh dot blot hemorrhages; CWS cotton wool spot; POAG primary open angle glaucoma; C/D cup-to-disc ratio; HVF humphrey visual field; GVF goldmann visual field; OCT optical coherence tomography; IOP intraocular pressure; BRVO Branch retinal vein occlusion; CRVO central retinal vein occlusion; CRAO central retinal artery occlusion; BRAO branch retinal artery occlusion; RT retinal tear; SB scleral buckle; PPV pars plana vitrectomy; VH Vitreous hemorrhage; PRP panretinal laser photocoagulation; IVK intravitreal kenalog; VMT vitreomacular traction; MH Macular hole;  NVD neovascularization of the disc; NVE neovascularization elsewhere; AREDS age related eye disease study; ARMD age related macular degeneration; POAG primary open angle glaucoma; EBMD epithelial/anterior basement membrane dystrophy; ACIOL anterior chamber intraocular lens; IOL intraocular lens; PCIOL posterior chamber intraocular lens; Phaco/IOL phacoemulsification with intraocular lens placement; [place] photorefractive keratectomy; LASIK laser assisted in situ keratomileusis; HTN hypertension; DM diabetes mellitus; COPD chronic obstructive pulmonary disease

## 2020-01-13 ENCOUNTER — Ambulatory Visit (INDEPENDENT_AMBULATORY_CARE_PROVIDER_SITE_OTHER): Payer: 59 | Admitting: Ophthalmology

## 2020-01-13 ENCOUNTER — Encounter (INDEPENDENT_AMBULATORY_CARE_PROVIDER_SITE_OTHER): Payer: Self-pay | Admitting: Ophthalmology

## 2020-01-13 ENCOUNTER — Other Ambulatory Visit: Payer: Self-pay

## 2020-01-13 DIAGNOSIS — I10 Essential (primary) hypertension: Secondary | ICD-10-CM | POA: Diagnosis not present

## 2020-01-13 DIAGNOSIS — H34231 Retinal artery branch occlusion, right eye: Secondary | ICD-10-CM

## 2020-01-13 DIAGNOSIS — H3581 Retinal edema: Secondary | ICD-10-CM

## 2020-01-13 DIAGNOSIS — H25813 Combined forms of age-related cataract, bilateral: Secondary | ICD-10-CM

## 2020-01-13 DIAGNOSIS — H33103 Unspecified retinoschisis, bilateral: Secondary | ICD-10-CM

## 2020-01-13 DIAGNOSIS — H33101 Unspecified retinoschisis, right eye: Secondary | ICD-10-CM

## 2020-01-13 DIAGNOSIS — H34831 Tributary (branch) retinal vein occlusion, right eye, with macular edema: Secondary | ICD-10-CM | POA: Diagnosis not present

## 2020-01-13 DIAGNOSIS — H35033 Hypertensive retinopathy, bilateral: Secondary | ICD-10-CM

## 2020-01-13 MED ORDER — BEVACIZUMAB CHEMO INJECTION 1.25MG/0.05ML SYRINGE FOR KALEIDOSCOPE
1.2500 mg | INTRAVITREAL | Status: AC | PRN
  Administered 2020-01-13: 1.25 mg via INTRAVITREAL

## 2020-02-09 NOTE — Progress Notes (Signed)
[facility] - Clinic Note  02/10/2020     CHIEF COMPLAINT Patient presents for Retina Follow Up   HISTORY OF PRESENT ILLNESS: Jacqueline Greer is a 59 y.o. female who presents to the clinic today for:   HPI    Retina Follow Up    Patient presents with  CRVO/BRVO.  In right eye.  This started weeks ago.  Severity is moderate.  Duration of weeks.  Since onset it is stable.  I, the attending physician,  performed the HPI with the patient and updated documentation appropriately.          Comments    Pt states vision is the same OU.  Pt denies eye pain or discomfort and denies any new or worsening floaters or fol OU.       Last edited by Rennis Chris, MD on 02/10/2020  9:34 AM. (History)    pt states   Referring physician: Demarco, [place], OD [address] [place],  [place] [address]  HISTORICAL INFORMATION:   Selected notes from the MEDICAL RECORD NUMBER Referred by Dr. Krista Blue for concern of BRVO   CURRENT MEDICATIONS: No current outpatient medications on file. (Ophthalmic Drugs)   No current facility-administered medications for this visit. (Ophthalmic Drugs)   Current Outpatient Medications (Other)  Medication Sig  . omeprazole (PRILOSEC) 20 MG capsule Take 20 mg by mouth daily.     No current facility-administered medications for this visit. (Other)      REVIEW OF SYSTEMS: ROS    Positive for: Gastrointestinal, Eyes   Negative for: Constitutional, Neurological, Skin, Genitourinary, Musculoskeletal, HENT, Endocrine, Cardiovascular, Respiratory, Psychiatric, Allergic/Imm, Heme/Lymph   Last edited by Corrinne Eagle on 02/10/2020  8:42 AM. (History)       ALLERGIES No Known Allergies  PAST MEDICAL HISTORY Past Medical History:  Diagnosis Date  . Cataract    OU  . Hypertensive retinopathy    OU   Past Surgical History:  Procedure Laterality Date  . LAPAROSCOPIC TUBAL LIGATION  2003    FAMILY HISTORY Family History  Problem  Relation Age of Onset  . Diabetes Mother   . Kidney failure Father     SOCIAL HISTORY Social History   Tobacco Use  . Smoking status: Never Smoker  . Smokeless tobacco: Never Used  Vaping Use  . Vaping Use: Never used  Substance Use Topics  . Alcohol use: No  . Drug use: No         OPHTHALMIC EXAM:  Base Eye Exam    Visual Acuity (Snellen - Linear)      Right Left   Dist cc 20/20 -1 20/20 -1   Correction: Glasses       Tonometry (Tonopen, 8:46 AM)      Right Left   Pressure 16 20       Pupils      Dark Light Shape React APD   Right 5 4 Round Brisk 0   Left 5 4 Round Brisk 0       Visual Fields      Left Right    Full Full       Extraocular Movement      Right Left    Full Full       Neuro/Psych    Oriented x3: Yes   Mood/Affect: Normal       Dilation    Both eyes: 1.0% Mydriacyl, 2.5% Phenylephrine @ 8:46 AM        Slit Lamp  and Fundus Exam    Slit Lamp Exam      Right Left   Lids/Lashes Normal Normal   Conjunctiva/Sclera Nasal and temporal Pinguecula, Melanosis Nasal and Pinguecula, Melanosis   Cornea trace Punctate epithelial erosions trace Punctate epithelial erosions   Anterior Chamber Deep and quiet Deep and quiet   Iris Round and dilated, No NVI Round and dilated, No NVI   Lens 2+ Nuclear sclerosis, 2+ Cortical cataract 2+ Nuclear sclerosis, 2+ Cortical cataract   Vitreous mild Vitreous syneresis, Posterior vitreous detachment mild Vitreous syneresis       Fundus Exam      Right Left   Disc Pink and Sharp, temporal PPP Pink and Sharp, temporal PPP   C/D Ratio 0.5 0.5   Macula Superior BRVO with central CME -- persistent; superior IRH -- persistent; residual clusters of DBH temporal macula -- focal exudates within Flat, Good foveal reflex, RPE mottling and clumping, No heme or edema, geographic area of hypopigmented RPE nasal and superior to fovea -- stable from prior   Vessels Vascular attenuation, Tortuous, AV crossing changes, ST  arterioles with severe attenuation and sclerosis, superior BRVO/BRAO Vascular attenuation, Tortuous, AV crossing changes   Periphery Attached, DBH temporal periphery -- extension of BRVO, schisis cavity IT periphery (from 0700-0900) - stable; focal pigmented CR scar at 0130 midzone, no RT/RD Attached, no heme, WWP temporal and inferior quadrants          IMAGING AND PROCEDURES  Imaging and Procedures for @TODAY @  OCT, Retina - OU - Both Eyes       Right Eye Quality was good. Central Foveal Thickness: 293. Progression has worsened. Findings include intraretinal fluid, intraretinal hyper-reflective material, normal foveal contour, no SRF (persistent IRF -- slightly worse, partial PVD).   Left Eye Quality was good. Central Foveal Thickness: 238. Progression has been stable. Findings include normal foveal contour, no IRF, no SRF (Partial PVD).   Notes *Images captured and stored on drive  Diagnosis / Impression:  OD: superior BRVO with CME, ?BRAO component -- persistent IRF -- slightly worse, partial PVD OS: NFP, no IRF/SRF  Clinical management:  See below  Abbreviations: NFP - Normal foveal profile. CME - cystoid macular edema. PED - pigment epithelial detachment. IRF - intraretinal fluid. SRF - subretinal fluid. EZ - ellipsoid zone. ERM - epiretinal membrane. ORA - outer retinal atrophy. ORT - outer retinal tubulation. SRHM - subretinal hyper-reflective material        Intravitreal Injection, Pharmacologic Agent - OD - Right Eye       Time Out 02/10/2020. 9:12 AM. Confirmed correct patient, procedure, site, and patient consented.   Anesthesia Topical anesthesia was used. Anesthetic medications included Lidocaine 2%, Proparacaine 0.5%.   Procedure Preparation included 5% betadine to ocular surface, eyelid speculum. A (32g) needle was used.   Injection:  1.25 mg Bevacizumab (AVASTIN) SOLN   NDC: 02/12/2020, [id number], Expiration date: 03/23/2020   Route:  Intravitreal, Site: Right Eye, Waste: 0.05 mL  Post-op Post injection exam found visual acuity of at least counting fingers. The patient tolerated the procedure well. There were no complications. The patient received written and verbal post procedure care education.                 ASSESSMENT/PLAN:    ICD-10-CM   1. 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 Retinal artery  branch occlusion of right eye  H34.231   3. Retinal edema  H35.81 OCT, Retina - OU - Both Eyes  4. Essential hypertension  I10   5. Hypertensive retinopathy of both eyes  H35.033   6. 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 BRVO with CME OD  - s/p IVA OD #1 (10.05.20), #2 (11.02.20), #3 (11.30.20), #4 (01.07.21), #5 (02.05.21), #6 (03.05.21), #7 (04.02.21), #8 (05.04.21), #9 (06.03.21), #10 (07.22.21), #11 (08.19.21)  - initial exam and OCT findings suggestive of a BRAO component contributing  - BCVA 20/20  - OCT shows persistent IRF -- slightly worse -- ?IVA resistance  - exam shows persistent IRH/DBH  - recommend IVA OD #12 today, 09.16.21 for BRVO w/ CME  - pt wishes to proceed  - RBA of procedure discussed, questions answered  - informed consent obtained  - see procedure note  - Avastin informed consent obtain and scanned on 01.07.21  - discussed possible resistance to IVA, and possible switch in medication  - Eylea4U benefits investigation started 9.16.21  - F/U 4 weeks -- DFE/OCT/possible injection, widefield OCT through schisis, possible Optos FA OD  4,5. Hypertensive retinopathy OU  - pt not formally diagnosed with HTN  - discussed importance of tight BP control and its relation to problems #1-3 above  - advised discussion with PCP and possible calibration of home BP cuff with PCP's  - monitor  6.  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  - discussed diagnosis and progression  - not yet visually significant  - monitor for now  7. Retinoschisis OU  - shallow schisis inferotemporal periphery -- stable  - stable on widefield OCT  - no RT/RD on scleral depression  - pt asymptomatic  - monitor   Ophthalmic Meds Ordered this visit:  Meds ordered this encounter  Medications  . Bevacizumab (AVASTIN) SOLN 1.25 mg       Return in about 4 weeks (around 03/09/2020) for f/u BRVO OD, DFE, OCT.  There are no Patient Instructions on file for this visit.   Explained the diagnoses, plan, and follow up with the patient and they expressed understanding.  Patient expressed understanding of the importance of proper follow up care.   This document serves as a record of services personally performed by Karie Chimera, MD, PhD. It was created on their behalf by Joni Reining, an ophthalmic technician. The creation of this record is the provider's dictation and/or activities during the visit.    Electronically signed by: Joni Reining COA, 02/10/20  9:34 PM   This document serves as a record of services personally performed by Karie Chimera, MD, PhD. It was created on their behalf by Glee Arvin. Manson Passey, OA an ophthalmic technician. The creation of this record is the provider's dictation and/or activities during the visit.    Electronically signed by: Glee Arvin. Kristopher Oppenheim 09.16.2021 9:34 PM  Karie Chimera, M.D., Ph.D. Diseases & Surgery of the Retina and Vitreous [facility]  I have reviewed the above documentation for accuracy and completeness, and I agree with the above. Karie Chimera, M.D., Ph.D. 02/10/20 9:34 PM   Abbreviations: M myopia (nearsighted); A astigmatism; H hyperopia (farsighted); P presbyopia; Mrx spectacle prescription;  CTL contact lenses; OD right eye; OS  left eye; OU both eyes  XT  exotropia; ET esotropia; PEK punctate epithelial keratitis; PEE punctate epithelial erosions; DES dry eye syndrome; MGD meibomian gland dysfunction; ATs artificial tears; PFAT's preservative free artificial tears; [place] nuclear sclerotic cataract; PSC posterior subcapsular cataract; ERM epi-retinal membrane; PVD posterior vitreous detachment; RD retinal detachment; DM diabetes mellitus; DR diabetic retinopathy; NPDR non-proliferative diabetic retinopathy; PDR proliferative diabetic retinopathy; CSME clinically significant macular edema; DME diabetic macular edema; dbh dot blot hemorrhages; CWS cotton wool spot; POAG primary open angle glaucoma; C/D cup-to-disc ratio; HVF humphrey visual field; GVF goldmann visual field; OCT optical coherence tomography; IOP intraocular pressure; BRVO Branch retinal vein occlusion; CRVO central retinal vein occlusion; CRAO central retinal artery occlusion; BRAO branch retinal artery occlusion; RT retinal tear; SB scleral buckle; PPV pars plana vitrectomy; VH Vitreous hemorrhage; PRP panretinal laser photocoagulation; IVK intravitreal kenalog; VMT vitreomacular traction; MH Macular hole;  NVD neovascularization of the disc; NVE neovascularization elsewhere; AREDS age related eye disease study; ARMD age related macular degeneration; POAG primary open angle glaucoma; EBMD epithelial/anterior basement membrane dystrophy; ACIOL anterior chamber intraocular lens; IOL intraocular lens; PCIOL posterior chamber intraocular lens; Phaco/IOL phacoemulsification with intraocular lens placement; [place] photorefractive keratectomy; LASIK laser assisted in situ keratomileusis; HTN hypertension; DM diabetes mellitus; COPD chronic obstructive pulmonary disease

## 2020-02-10 ENCOUNTER — Ambulatory Visit (INDEPENDENT_AMBULATORY_CARE_PROVIDER_SITE_OTHER): Payer: 59 | Admitting: Ophthalmology

## 2020-02-10 ENCOUNTER — Other Ambulatory Visit: Payer: Self-pay

## 2020-02-10 ENCOUNTER — Encounter (INDEPENDENT_AMBULATORY_CARE_PROVIDER_SITE_OTHER): Payer: Self-pay | Admitting: Ophthalmology

## 2020-02-10 DIAGNOSIS — H34831 Tributary (branch) retinal vein occlusion, right eye, with macular edema: Secondary | ICD-10-CM | POA: Diagnosis not present

## 2020-02-10 DIAGNOSIS — H35033 Hypertensive retinopathy, bilateral: Secondary | ICD-10-CM

## 2020-02-10 DIAGNOSIS — H3581 Retinal edema: Secondary | ICD-10-CM

## 2020-02-10 DIAGNOSIS — H25813 Combined forms of age-related cataract, bilateral: Secondary | ICD-10-CM

## 2020-02-10 DIAGNOSIS — H33103 Unspecified retinoschisis, bilateral: Secondary | ICD-10-CM

## 2020-02-10 DIAGNOSIS — I10 Essential (primary) hypertension: Secondary | ICD-10-CM | POA: Diagnosis not present

## 2020-02-10 DIAGNOSIS — H34231 Retinal artery branch occlusion, right eye: Secondary | ICD-10-CM

## 2020-02-10 MED ORDER — BEVACIZUMAB CHEMO INJECTION 1.25MG/0.05ML SYRINGE FOR KALEIDOSCOPE
1.2500 mg | INTRAVITREAL | Status: AC | PRN
  Administered 2020-02-10: 1.25 mg via INTRAVITREAL

## 2020-03-07 NOTE — Progress Notes (Signed)
[facility] - Clinic Note  03/09/2020     CHIEF COMPLAINT Patient presents for Retina Follow Up   HISTORY OF PRESENT ILLNESS: Jacqueline Greer is a 59 y.o. female who presents to the clinic today for:   HPI    Retina Follow Up    Patient presents with  CRVO/BRVO.  In right eye.  This started 4 weeks ago.  I, the attending physician,  performed the HPI with the patient and updated documentation appropriately.          Comments    Patient here for 4 weeks retina follow up for BRVO w CME OD. Patient states vision about the same. No eye pain.       Last edited by Rennis Chris, MD on 03/09/2020 10:14 PM. (History)    pt states   Referring physician: Luciano Cutter, PA-C [address] [place] ,  [place] [address]  HISTORICAL INFORMATION:   Selected notes from the MEDICAL RECORD NUMBER Referred by Dr. Krista Blue for concern of BRVO   CURRENT MEDICATIONS: No current outpatient medications on file. (Ophthalmic Drugs)   No current facility-administered medications for this visit. (Ophthalmic Drugs)   Current Outpatient Medications (Other)  Medication Sig  . omeprazole (PRILOSEC) 20 MG capsule Take 20 mg by mouth daily.     No current facility-administered medications for this visit. (Other)      REVIEW OF SYSTEMS: ROS    Positive for: Gastrointestinal, Eyes   Negative for: Constitutional, Neurological, Skin, Genitourinary, Musculoskeletal, HENT, Endocrine, Cardiovascular, Respiratory, Psychiatric, Allergic/Imm, Heme/Lymph   Last edited by Laddie Aquas, COA on 03/09/2020  8:52 AM. (History)       ALLERGIES No Known Allergies  PAST MEDICAL HISTORY Past Medical History:  Diagnosis Date  . Cataract    OU  . Hypertensive retinopathy    OU   Past Surgical History:  Procedure Laterality Date  . LAPAROSCOPIC TUBAL LIGATION  2003    FAMILY HISTORY Family History  Problem Relation Age of Onset  . Diabetes Mother   . Kidney  failure Father     SOCIAL HISTORY Social History   Tobacco Use  . Smoking status: Never Smoker  . Smokeless tobacco: Never Used  Vaping Use  . Vaping Use: Never used  Substance Use Topics  . Alcohol use: No  . Drug use: No         OPHTHALMIC EXAM:  Base Eye Exam    Visual Acuity (Snellen - Linear)      Right Left   Dist cc 20/20 -1 20/20 -1   Correction: Glasses       Tonometry (Tonopen, 8:49 AM)      Right Left   Pressure 16 15       Pupils      Dark Light Shape React APD   Right 5 4 Round Brisk None   Left 5 4 Round Brisk None       Visual Fields (Counting fingers)      Left Right    Full Full       Extraocular Movement      Right Left    Full, Ortho Full, Ortho       Neuro/Psych    Oriented x3: Yes   Mood/Affect: Normal       Dilation    Both eyes: 1.0% Mydriacyl, 2.5% Phenylephrine @ 8:48 AM        Slit Lamp and Fundus Exam    Slit Lamp Exam  Right Left   Lids/Lashes Normal Normal   Conjunctiva/Sclera Nasal and temporal Pinguecula, Melanosis Nasal and Pinguecula, Melanosis   Cornea trace Punctate epithelial erosions trace Punctate epithelial erosions   Anterior Chamber Deep and quiet Deep and quiet   Iris Round and dilated, No NVI Round and dilated, No NVI   Lens 2+ Nuclear sclerosis, 2+ Cortical cataract 2+ Nuclear sclerosis, 2+ Cortical cataract   Vitreous mild Vitreous syneresis, Posterior vitreous detachment mild Vitreous syneresis       Fundus Exam      Right Left   Disc Pink and Sharp, temporal PPP Pink and Sharp, temporal PPP   C/D Ratio 0.5 0.5   Macula Superior BRVO with central CME -- persistent; superior IRH -- resolved; persistent clusters of DBH temporal macula -- focal exudates within Flat, Good foveal reflex, RPE mottling and clumping, No heme or edema, geographic area of hypopigmented RPE nasal and superior to fovea -- stable from prior   Vessels Vascular attenuation, Tortuous, AV crossing changes, ST arterioles with  severe attenuation and sclerosis, superior BRVO/BRAO Vascular attenuation, Tortuous, AV crossing changes   Periphery Attached, DBH temporal periphery -- extension of BRVO, schisis cavity IT periphery (from 0700-0900) - stable; focal pigmented CR scar at 0130 midzone, no RT/RD Attached, no heme, WWP temporal and inferior quadrants, shallow schisis IT periphery          IMAGING AND PROCEDURES  Imaging and Procedures for @TODAY @  OCT, Retina - OU - Both Eyes       Right Eye Quality was good. Central Foveal Thickness: 293. Progression has been stable. Findings include intraretinal fluid, intraretinal hyper-reflective material, normal foveal contour, no SRF (Mild improvement in IRF; partial PVD).   Left Eye Quality was good. Central Foveal Thickness: 238. Progression has been stable. Findings include normal foveal contour, no IRF, no SRF (Partial PVD).   Notes *Images captured and stored on drive  Diagnosis / Impression:  OD: superior BRVO with CME, ?BRAO component -- Mild improvement in IRF; partial PVD OS: NFP, no IRF/SRF  Clinical management:  See below  Abbreviations: NFP - Normal foveal profile. CME - cystoid macular edema. PED - pigment epithelial detachment. IRF - intraretinal fluid. SRF - subretinal fluid. EZ - ellipsoid zone. ERM - epiretinal membrane. ORA - outer retinal atrophy. ORT - outer retinal tubulation. SRHM - subretinal hyper-reflective material        Intravitreal Injection, Pharmacologic Agent - OD - Right Eye       Time Out 03/09/2020. 9:23 AM. Confirmed correct patient, procedure, site, and patient consented.   Anesthesia Topical anesthesia was used. Anesthetic medications included Lidocaine 2%, Proparacaine 0.5%.   Procedure Preparation included 5% betadine to ocular surface, eyelid speculum. A (32g) needle was used.   Injection:  2 mg aflibercept 03/11/2020) SOLN   NDC: Gretta Cool, Lot: [id number], Expiration date: 05/27/2020   Route: Intravitreal,  Site: Right Eye, Waste: 0.05 mL  Post-op Post injection exam found visual acuity of at least counting fingers. The patient tolerated the procedure well. There were no complications. The patient received written and verbal post procedure care education.                 ASSESSMENT/PLAN:    ICD-10-CM   1. Branch retinal vein occlusion of right eye with macular edema  H34.8310 Intravitreal Injection, Pharmacologic Agent - OD - Right Eye    aflibercept (EYLEA) SOLN 2 mg  2. 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 Retinal  edema  H35.81 OCT, Retina - OU - Both Eyes  4. Essential hypertension  I10   5. Hypertensive retinopathy of both eyes  H35.033   6. 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 BRVO with CME OD  - s/p IVA OD #1 (10.05.20), #2 (11.02.20), #3 (11.30.20), #4 (01.07.21), #5 (02.05.21), #6 (03.05.21), #7 (04.02.21), #8 (05.04.21), #9 (06.03.21), #10 (07.22.21), #11 (08.19.21), #12 (09.16.21) -- IVA resistance  - initial exam and OCT findings suggestive of a BRAO component contributing  - BCVA 20/20  - OCT shows persistent IRF -- IVA unable to completely resolve cystic changes / IRF  - exam shows persistent IRH/DBH  - discussed possible resistance to IVA, and possible switch in medication  - recommend IVE OD #1 today, 10.14.21 for BRVO w/ CME  - pt wishes to proceed  - RBA of procedure discussed, questions answered  - informed consent obtained  - see procedure note  - Avastin informed consent obtain and scanned on 01.07.21  - Eylea4U benefits investigation started 9.16.21 -- approved as of 10.14.21 (Good Days Copay card)  - F/U 4 weeks -- DFE/OCT/possible injection, widefield OCT through schisis, possible Optos FA OD  4,5. Hypertensive retinopathy OU  - pt not formally diagnosed with HTN  - discussed importance of tight BP control and its relation to problems #1-3 above  - advised  discussion with PCP and possible calibration of home BP cuff with PCP's  - monitor  6. 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  - discussed diagnosis and progression  - not yet visually significant  - monitor for now  7. Retinoschisis OU  - shallow schisis inferotemporal periphery -- stable  - stable on widefield OCT  - no RT/RD on scleral depression  - pt asymptomatic  - monitor   Ophthalmic Meds Ordered this visit:  Meds ordered this encounter  Medications  . aflibercept (EYLEA) SOLN 2 mg       Return in about 4 weeks (around 04/06/2020) for f/u BRVO OD, DFE, OCT.  There are no Patient Instructions on file for this visit.   Explained the diagnoses, plan, and follow up with the patient and they expressed understanding.  Patient expressed understanding of the importance of proper follow up care.   This document serves as a record of services personally performed by Karie ChimeraBrian G. Caidin Heidenreich, MD, PhD. It was created on their behalf by Joni Reiningobin Hodges, an ophthalmic technician. The creation of this record is the provider's dictation and/or activities during the visit.    Electronically signed by: Joni Reiningobin Hodges COA, 03/09/20  10:17 PM   This document serves as a record of services personally performed by Karie ChimeraBrian G. Tessia Kassin, MD, PhD. It was created on their behalf by Glee ArvinAmanda J. Manson PasseyBrown, OA an ophthalmic technician. The creation of this record is the provider's dictation and/or activities during the visit.    Electronically signed by: Glee ArvinAmanda J. Manson PasseyBrown, [place] 10.14.2021 10:17 PM  Karie ChimeraBrian G. Marbella Markgraf, M.D., Ph.D. Diseases & Surgery of the Retina and Vitreous [facility]  I have reviewed the above documentation for accuracy and completeness, and I agree with the above. Karie ChimeraBrian G. Lylee Corrow, M.D., Ph.D. 03/09/20 10:17 PM   Abbreviations: M myopia (nearsighted); A astigmatism; H hyperopia  (farsighted); P presbyopia; Mrx spectacle prescription;  CTL contact lenses; OD right eye; OS  left eye; OU both eyes  XT exotropia; ET esotropia; PEK punctate epithelial keratitis; PEE punctate epithelial erosions; DES dry eye syndrome; MGD meibomian gland dysfunction; ATs artificial tears; PFAT's preservative free artificial tears; NSC nuclear sclerotic cataract; PSC posterior subcapsular cataract; ERM epi-retinal membrane; PVD posterior vitreous detachment; RD retinal detachment; DM diabetes mellitus; DR diabetic retinopathy; NPDR non-proliferative diabetic retinopathy; PDR proliferative diabetic retinopathy; CSME clinically significant macular edema; DME diabetic macular edema; dbh dot blot hemorrhages; CWS cotton wool spot; POAG primary open angle glaucoma; C/D cup-to-disc ratio; HVF humphrey visual field; GVF goldmann visual field; OCT optical coherence tomography; IOP intraocular pressure; BRVO Branch retinal vein occlusion; CRVO central retinal vein occlusion; CRAO central retinal artery occlusion; BRAO branch retinal artery occlusion; RT retinal tear; SB scleral buckle; PPV pars plana vitrectomy; VH Vitreous hemorrhage; PRP panretinal laser photocoagulation; IVK intravitreal kenalog; VMT vitreomacular traction; MH Macular hole;  NVD neovascularization of the disc; NVE neovascularization elsewhere; AREDS age related eye disease study; ARMD age related macular degeneration; POAG primary open angle glaucoma; EBMD epithelial/anterior basement membrane dystrophy; ACIOL anterior chamber intraocular lens; IOL intraocular lens; PCIOL posterior chamber intraocular lens; Phaco/IOL phacoemulsification with intraocular lens placement; PRK photorefractive keratectomy; LASIK laser assisted in situ keratomileusis; HTN hypertension; DM diabetes mellitus; COPD chronic obstructive pulmonary disease

## 2020-03-09 ENCOUNTER — Encounter (INDEPENDENT_AMBULATORY_CARE_PROVIDER_SITE_OTHER): Payer: Self-pay | Admitting: Ophthalmology

## 2020-03-09 ENCOUNTER — Other Ambulatory Visit: Payer: Self-pay

## 2020-03-09 ENCOUNTER — Ambulatory Visit (INDEPENDENT_AMBULATORY_CARE_PROVIDER_SITE_OTHER): Payer: 59 | Admitting: Ophthalmology

## 2020-03-09 DIAGNOSIS — H25813 Combined forms of age-related cataract, bilateral: Secondary | ICD-10-CM

## 2020-03-09 DIAGNOSIS — H34231 Retinal artery branch occlusion, right eye: Secondary | ICD-10-CM

## 2020-03-09 DIAGNOSIS — H3581 Retinal edema: Secondary | ICD-10-CM | POA: Diagnosis not present

## 2020-03-09 DIAGNOSIS — H35033 Hypertensive retinopathy, bilateral: Secondary | ICD-10-CM

## 2020-03-09 DIAGNOSIS — H34831 Tributary (branch) retinal vein occlusion, right eye, with macular edema: Secondary | ICD-10-CM

## 2020-03-09 DIAGNOSIS — I10 Essential (primary) hypertension: Secondary | ICD-10-CM | POA: Diagnosis not present

## 2020-03-09 DIAGNOSIS — H33103 Unspecified retinoschisis, bilateral: Secondary | ICD-10-CM

## 2020-03-09 MED ORDER — AFLIBERCEPT 2MG/0.05ML IZ SOLN FOR KALEIDOSCOPE
2.0000 mg | INTRAVITREAL | Status: AC | PRN
Start: 2020-03-09 — End: 2020-03-09
  Administered 2020-03-09: 2 mg via INTRAVITREAL

## 2020-04-05 NOTE — Progress Notes (Signed)
[facility] - Clinic Note  04/06/2020     CHIEF COMPLAINT Patient presents for Retina Follow Up   HISTORY OF PRESENT ILLNESS: Jacqueline Greer is a 59 y.o. female who presents to the clinic today for:   HPI    Retina Follow Up    Patient presents with  CRVO/BRVO.  In right eye.  This started months ago.  Severity is mild.  Duration of 4 weeks.  Since onset it is stable.  I, the attending physician,  performed the HPI with the patient and updated documentation appropriately.          Comments    59 y/o female pt here for 4 wk f/u for BRVO w/CME OD.  No change in [place] OU.  Denies pain, FOL, floaters.  No gtts.       Last edited by Rennis Chris, MD on 04/06/2020  9:41 AM. (History)    pt states   Referring physician: Luciano Cutter, PA-C [address] [place] ,  [place] [address]  HISTORICAL INFORMATION:   Selected notes from the MEDICAL RECORD NUMBER Referred by Dr. Krista Blue for concern of BRVO   CURRENT MEDICATIONS: No current outpatient medications on file. (Ophthalmic Drugs)   No current facility-administered medications for this visit. (Ophthalmic Drugs)   Current Outpatient Medications (Other)  Medication Sig  . omeprazole (PRILOSEC) 20 MG capsule Take 20 mg by mouth daily.     No current facility-administered medications for this visit. (Other)      REVIEW OF SYSTEMS: ROS    Positive for: Eyes   Negative for: Constitutional, Gastrointestinal, Neurological, Skin, Genitourinary, Musculoskeletal, HENT, Endocrine, Cardiovascular, Respiratory, Psychiatric, Allergic/Imm, Heme/Lymph   Last edited by Celine Mans, COA on 04/06/2020  8:36 AM. (History)       ALLERGIES No Known Allergies  PAST MEDICAL HISTORY Past Medical History:  Diagnosis Date  . Cataract    OU  . Hypertensive retinopathy    OU   Past Surgical History:  Procedure Laterality Date  . LAPAROSCOPIC TUBAL LIGATION  2003    FAMILY HISTORY Family History   Problem Relation Age of Onset  . Diabetes Mother   . Kidney failure Father     SOCIAL HISTORY Social History   Tobacco Use  . Smoking status: Never Smoker  . Smokeless tobacco: Never Used  Vaping Use  . Vaping Use: Never used  Substance Use Topics  . Alcohol use: No  . Drug use: No         OPHTHALMIC EXAM:  Base Eye Exam    Visual Acuity (Snellen - Linear)      Right Left   Dist cc 20/20 20/20 -   Correction: Glasses       Tonometry (Tonopen, 8:39 AM)      Right Left   Pressure 15 16       Pupils      Dark Light Shape React APD   Right 5 4 Round Brisk None   Left 5 4 Round Brisk None       Visual Fields (Counting fingers)      Left Right    Full Full       Extraocular Movement      Right Left    Full, Ortho Full, Ortho       Neuro/Psych    Oriented x3: Yes   Mood/Affect: Normal       Dilation    Both eyes: 1.0% Mydriacyl, 2.5% Phenylephrine @ 8:39 AM  Slit Lamp and Fundus Exam    Slit Lamp Exam      Right Left   Lids/Lashes Normal Normal   Conjunctiva/Sclera Nasal and temporal Pinguecula, Melanosis Nasal and Pinguecula, Melanosis   Cornea trace Punctate epithelial erosions trace Punctate epithelial erosions   Anterior Chamber Deep and quiet Deep and quiet   Iris Round and dilated, No NVI Round and dilated, No NVI   Lens 2+ Nuclear sclerosis, 2+ Cortical cataract 2+ Nuclear sclerosis, 2+ Cortical cataract   Vitreous mild Vitreous syneresis, Posterior vitreous detachment mild Vitreous syneresis       Fundus Exam      Right Left   Disc Pink and Sharp, temporal PPP Pink and Sharp, temporal PPP   C/D Ratio 0.5 0.5   Macula Superior BRVO with central CME -- improved; superior IRH -- resolved; persistent clusters of DBH temporal macula -- focal exudates improved Flat, Good foveal reflex, RPE mottling and clumping, No heme or edema, geographic area of hypopigmented RPE nasal and superior to fovea -- stable from prior   Vessels Vascular  attenuation, Tortuous, AV crossing changes, ST arterioles with severe attenuation and sclerosis, superior BRVO/BRAO Vascular attenuation, Tortuous, AV crossing changes   Periphery Attached, DBH temporal periphery -- extension of BRVO, schisis cavity IT periphery (from 0700-0900) - stable; focal pigmented CR scar at 0130 midzone, no RT/RD Attached, no heme, WWP temporal and inferior quadrants, shallow schisis IT periphery          IMAGING AND PROCEDURES  Imaging and Procedures for @TODAY @  OCT, Retina - OU - Both Eyes       Right Eye Quality was good. Central Foveal Thickness: 261. Progression has improved. Findings include intraretinal fluid, intraretinal hyper-reflective material, normal foveal contour, no SRF (Interval improvement in IRF and foveal profile; partial PVD/ Retinoschisis IT caught on widefield).   Left Eye Quality was good. Central Foveal Thickness: 238. Progression has been stable. Findings include normal foveal contour, no IRF, no SRF (Partial PVD, Shallow IT Retinoschisis caught on widefield).   Notes *Images captured and stored on drive  Diagnosis / Impression:  OD: superior BRVO with CME, ?BRAO component -- Interval improvement in IRF and foveal profile; partial PVD/ Retinoschisis IT caught on widefield OS: NFP, no IRF/SRF; Partial PVD, Shallow IT Retinoschisis caught on widefield  Clinical management:  See below  Abbreviations: NFP - Normal foveal profile. CME - cystoid macular edema. PED - pigment epithelial detachment. IRF - intraretinal fluid. SRF - subretinal fluid. EZ - ellipsoid zone. ERM - epiretinal membrane. ORA - outer retinal atrophy. ORT - outer retinal tubulation. SRHM - subretinal hyper-reflective material        Intravitreal Injection, Pharmacologic Agent - OD - Right Eye       Time Out 04/06/2020. 9:55 AM. Confirmed correct patient, procedure, site, and patient consented.   Anesthesia Topical anesthesia was used. Anesthetic medications  included Lidocaine 2%, Proparacaine 0.5%.   Procedure Preparation included 5% betadine to ocular surface, eyelid speculum. A (32g) needle was used.   Injection:  2 mg aflibercept 13/03/2020) SOLN   NDC: Gretta Cool, Lot: [id number], Expiration date: 07/24/2020   Route: Intravitreal, Site: Right Eye, Waste: 0.05 mL  Post-op Post injection exam found visual acuity of at least counting fingers. The patient tolerated the procedure well. There were no complications. The patient received written and verbal post procedure care education. Post injection medications were not given.                 ASSESSMENT/PLAN:  ICD-10-CM   1. Branch retinal vein occlusion of right eye with macular edema  H34.8310 Intravitreal Injection, Pharmacologic Agent - OD - Right Eye    aflibercept (EYLEA) SOLN 2 mg  2. 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 Retinal edema  H35.81 OCT, Retina - OU - Both Eyes  4. Essential hypertension  I10   5. Hypertensive retinopathy of both eyes  H35.033   6. 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 BRVO with CME OD  - s/p IVA OD #1 (10.05.20), #2 (11.02.20), #3 (11.30.20), #4 (01.07.21), #5 (02.05.21), #6 (03.05.21), #7 (04.02.21), #8 (05.04.21), #9 (06.03.21), #10 (07.22.21), #11 (08.19.21), #12 (09.16.21) -- IVA resistance  - initial exam and OCT findings suggestive of a BRAO component contributing  - s/p IVE OD #1 (10.14.21)  - BCVA stable OD -- 20/20  - OCT shows Interval improvement in IRF and foveal profile -- good response to IVE #1  - exam shows improvement in IRH/DBH and exudates  - recommend IVE OD #2 today, 11.11.21 for BRVO w/ CME  - pt wishes to proceed  - RBA of procedure discussed, questions answered  - informed consent obtained  - see procedure note  - Avastin informed consent obtain and scanned on 01.07.21  - [employer] benefits investigation started 9.16.21 -- approved  as of 10.14.21 (Good Days Copay card)  - F/U 4 weeks -- DFE/OCT/possible injection, widefield OCT through schisis, possible Optos FA OD  4,5. Hypertensive retinopathy OU  - pt not formally diagnosed with HTN  - discussed importance of tight BP control and its relation to problems #1-3 above  - advised discussion with PCP and possible calibration of home BP cuff with PCP's  - monitor  6. 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  - discussed diagnosis and progression  - not yet visually significant  - monitor for now  7. Retinoschisis OU  - shallow schisis inferotemporal periphery -- stable  - stable on widefield OCT  - no RT/RD on scleral depression  - pt asymptomatic  - monitor   Ophthalmic Meds Ordered this visit:  Meds ordered this encounter  Medications  . aflibercept (EYLEA) SOLN 2 mg       Return in about 4 weeks (around 05/04/2020) for BRVO OD, DFE/OCT.  There are no Patient Instructions on file for this visit.   Explained the diagnoses, plan, and follow up with the patient and they expressed understanding.  Patient expressed understanding of the importance of proper follow up care.   This document serves as a record of services personally performed by Karie Chimera, MD, PhD. It was created on their behalf by Joni Reining, an ophthalmic technician. The creation of this record is the provider's dictation and/or activities during the visit.    Electronically signed by: Joni Reining COA, 04/06/20  10:57 PM   This document serves as a record of services personally performed by Karie Chimera, MD, PhD. It was created on their behalf by Glee Arvin. Manson Passey, OA an ophthalmic technician. The creation of this record is the provider's dictation and/or activities during the visit.    Electronically signed by: Glee Arvin. Manson Passey OA @TODAY @ 10:57 PM   , M.D.,  Ph.D. Diseases & Surgery of the Retina and Vitreous [facility]  I have reviewed the above documentation for accuracy and completeness, and I agree with the above. Karie ChimeraBrian G. Kalijah Zeiss, M.D., Ph.D. 04/06/20 10:57 PM   Abbreviations: M myopia (nearsighted); A astigmatism; H hyperopia (farsighted); P presbyopia; Mrx spectacle prescription;  CTL contact lenses; OD right eye; OS left eye; OU both eyes  XT exotropia; ET esotropia; PEK punctate epithelial keratitis; PEE punctate epithelial erosions; DES dry eye syndrome; MGD meibomian gland dysfunction; ATs artificial tears; PFAT's preservative free artificial tears; NSC nuclear sclerotic cataract; PSC posterior subcapsular cataract; ERM epi-retinal membrane; PVD posterior vitreous detachment; RD retinal detachment; DM diabetes mellitus; DR diabetic retinopathy; NPDR non-proliferative diabetic retinopathy; PDR proliferative diabetic retinopathy; CSME clinically significant macular edema; DME diabetic macular edema; dbh dot blot hemorrhages; CWS cotton wool spot; POAG primary open angle glaucoma; C/D cup-to-disc ratio; HVF humphrey visual field; GVF goldmann visual field; OCT optical coherence tomography; IOP intraocular pressure; BRVO Branch retinal vein occlusion; CRVO central retinal vein occlusion; CRAO central retinal artery occlusion; BRAO branch retinal artery occlusion; RT retinal tear; SB scleral buckle; PPV pars plana vitrectomy; VH Vitreous hemorrhage; PRP panretinal laser photocoagulation; IVK intravitreal kenalog; VMT vitreomacular traction; MH Macular hole;  NVD neovascularization of the disc; NVE neovascularization elsewhere; AREDS age related eye disease study; ARMD age related macular degeneration; POAG primary open angle glaucoma; EBMD epithelial/anterior basement membrane dystrophy; ACIOL anterior chamber intraocular lens; IOL intraocular lens; PCIOL posterior chamber intraocular lens; Phaco/IOL phacoemulsification with  intraocular lens placement; PRK photorefractive keratectomy; LASIK laser assisted in situ keratomileusis; HTN hypertension; DM diabetes mellitus; COPD chronic obstructive pulmonary disease

## 2020-04-06 ENCOUNTER — Encounter (INDEPENDENT_AMBULATORY_CARE_PROVIDER_SITE_OTHER): Payer: Self-pay | Admitting: Ophthalmology

## 2020-04-06 ENCOUNTER — Other Ambulatory Visit: Payer: Self-pay

## 2020-04-06 ENCOUNTER — Ambulatory Visit (INDEPENDENT_AMBULATORY_CARE_PROVIDER_SITE_OTHER): Payer: 59 | Admitting: Ophthalmology

## 2020-04-06 DIAGNOSIS — H34831 Tributary (branch) retinal vein occlusion, right eye, with macular edema: Secondary | ICD-10-CM | POA: Diagnosis not present

## 2020-04-06 DIAGNOSIS — H3581 Retinal edema: Secondary | ICD-10-CM

## 2020-04-06 DIAGNOSIS — H34231 Retinal artery branch occlusion, right eye: Secondary | ICD-10-CM | POA: Diagnosis not present

## 2020-04-06 DIAGNOSIS — I10 Essential (primary) hypertension: Secondary | ICD-10-CM

## 2020-04-06 DIAGNOSIS — H33103 Unspecified retinoschisis, bilateral: Secondary | ICD-10-CM

## 2020-04-06 DIAGNOSIS — H35033 Hypertensive retinopathy, bilateral: Secondary | ICD-10-CM

## 2020-04-06 DIAGNOSIS — H25813 Combined forms of age-related cataract, bilateral: Secondary | ICD-10-CM

## 2020-04-06 MED ORDER — AFLIBERCEPT 2MG/0.05ML IZ SOLN FOR KALEIDOSCOPE
2.0000 mg | INTRAVITREAL | Status: AC | PRN
  Administered 2020-04-06: 2 mg via INTRAVITREAL

## 2020-04-27 NOTE — Progress Notes (Signed)
[facility] - Clinic Note  05/04/2020     CHIEF COMPLAINT Patient presents for Retina Follow Up   HISTORY OF PRESENT ILLNESS: Jacqueline Greer is a 59 y.o. female who presents to the clinic today for:   HPI    Retina Follow Up    Patient presents with  CRVO/BRVO.  In right eye.  This started weeks ago.  Severity is moderate.  Duration of weeks.  Since onset it is stable.  I, the attending physician,  performed the HPI with the patient and updated documentation appropriately.          Comments    Pt states vision is the same OU.  Pt denies eye pain or discomfort and denies any new or worsening floaters or fol OU.       Last edited by Rennis Chris, MD on 05/04/2020  5:58 PM. (History)    pt states   Referring physician: Luciano Cutter, PA-C [address] [place] ,  [place] [address]  HISTORICAL INFORMATION:   Selected notes from the MEDICAL RECORD NUMBER Referred by Dr. Krista Blue for concern of BRVO   CURRENT MEDICATIONS: No current outpatient medications on file. (Ophthalmic Drugs)   No current facility-administered medications for this visit. (Ophthalmic Drugs)   Current Outpatient Medications (Other)  Medication Sig  . omeprazole (PRILOSEC) 20 MG capsule Take 20 mg by mouth daily.     No current facility-administered medications for this visit. (Other)      REVIEW OF SYSTEMS: ROS    Positive for: Eyes   Negative for: Constitutional, Gastrointestinal, Neurological, Skin, Genitourinary, Musculoskeletal, HENT, Endocrine, Cardiovascular, Respiratory, Psychiatric, Allergic/Imm, Heme/Lymph   Last edited by Corrinne Eagle on 05/04/2020  8:52 AM. (History)       ALLERGIES No Known Allergies  PAST MEDICAL HISTORY Past Medical History:  Diagnosis Date  . Cataract    OU  . Hypertensive retinopathy    OU   Past Surgical History:  Procedure Laterality Date  . LAPAROSCOPIC TUBAL LIGATION  2003    FAMILY HISTORY Family History   Problem Relation Age of Onset  . Diabetes Mother   . Kidney failure Father     SOCIAL HISTORY Social History   Tobacco Use  . Smoking status: Never Smoker  . Smokeless tobacco: Never Used  Vaping Use  . Vaping Use: Never used  Substance Use Topics  . Alcohol use: No  . Drug use: No         OPHTHALMIC EXAM:  Base Eye Exam    Visual Acuity (Snellen - Linear)      Right Left   Dist cc 20/20 -1 20/20 -2   Correction: Glasses       Tonometry (Tonopen, 8:57 AM)      Right Left   Pressure 16 18       Pupils      Dark Light Shape React APD   Right 5 4 Round Brisk 0   Left 5 4 Round Brisk 0       Visual Fields      Left Right    Full Full       Extraocular Movement      Right Left    Full Full       Neuro/Psych    Oriented x3: Yes   Mood/Affect: Normal       Dilation    Both eyes: 1.0% Mydriacyl, 2.5% Phenylephrine @ 8:57 AM        Slit  Lamp and Fundus Exam    Slit Lamp Exam      Right Left   Lids/Lashes Normal Normal   Conjunctiva/Sclera Nasal and temporal Pinguecula, Melanosis Nasal and Pinguecula, Melanosis   Cornea trace Punctate epithelial erosions trace Punctate epithelial erosions   Anterior Chamber Deep and quiet Deep and quiet   Iris Round and dilated, No NVI Round and dilated, No NVI   Lens 2+ Nuclear sclerosis, 2+ Cortical cataract 2+ Nuclear sclerosis, 2+ Cortical cataract   Vitreous mild Vitreous syneresis, Posterior vitreous detachment mild Vitreous syneresis       Fundus Exam      Right Left   Disc Pink and Sharp, temporal PPP Pink and Sharp, temporal PPP, peripapillary cystic changes, no heme   C/D Ratio 0.5 0.5   Macula Superior BRVO with central CME -- improved; superior IRH -- resolved; persistent clusters of DBH temporal macula (improving) -- focal exudates improved Flat, Good foveal reflex, RPE mottling and clumping, No heme or edema, geographic area of hypopigmented RPE nasal and superior to fovea -- stable from prior    Vessels Vascular attenuation, Tortuous, AV crossing changes, ST arterioles with severe attenuation and sclerosis, superior BRVO/BRAO Vascular attenuation, Tortuous, AV crossing changes, peripheral sclerosis ST periphery   Periphery Attached, DBH temporal periphery -- extension of BRVO, schisis cavity IT periphery (from 0700-0900) - stable; focal pigmented CR scar at 0130 midzone, no RT/RD Attached, no heme, WWP temporal and inferior quadrants, shallow schisis IT periphery          IMAGING AND PROCEDURES  Imaging and Procedures for @TODAY @  OCT, Retina - OU - Both Eyes       Right Eye Quality was good. Central Foveal Thickness: 257. Progression has improved. Findings include intraretinal fluid, intraretinal hyper-reflective material, normal foveal contour, no SRF (Interval improvement in cystic changes, stable schisis IT periphery caught on widefield).   Left Eye Quality was good. Central Foveal Thickness: 232. Progression has worsened. Findings include normal foveal contour, no IRF, no SRF (Partial PVD, Shallow IT Retinoschisis caught on widefield, interval development of peripapillary cystic changes).   Notes *Images captured and stored on drive  Diagnosis / Impression:  OD: superior BRVO with CME, ?BRAO component -- Interval improvement in cystic changes; stable schisis IT periphery caught on widefield OS: NFP, no IRF/SRF; Partial PVD, Shallow IT Retinoschisis caught on widefield, interval development of peripapillary cystic changes  Clinical management:  See below  Abbreviations: NFP - Normal foveal profile. CME - cystoid macular edema. PED - pigment epithelial detachment. IRF - intraretinal fluid. SRF - subretinal fluid. EZ - ellipsoid zone. ERM - epiretinal membrane. ORA - outer retinal atrophy. ORT - outer retinal tubulation. SRHM - subretinal hyper-reflective material        Intravitreal Injection, Pharmacologic Agent - OD - Right Eye       Time Out 05/04/2020. 9:46 AM.  Confirmed correct patient, procedure, site, and patient consented.   Anesthesia Topical anesthesia was used. Anesthetic medications included Lidocaine 2%, Proparacaine 0.5%.   Procedure Preparation included 5% betadine to ocular surface, eyelid speculum. A (32g) needle was used.   Injection:  2 mg aflibercept 14/01/2020) SOLN   NDC: Gretta Cool, Lot: [id number], Expiration date: 08/24/2020   Route: Intravitreal, Site: Right Eye, Waste: 0.05 mL  Post-op Post injection exam found visual acuity of at least counting fingers. The patient tolerated the procedure well. There were no complications. The patient received written and verbal post procedure care education. Post injection medications were not given.  ASSESSMENT/PLAN:    ICD-10-CM   1. Branch retinal vein occlusion of right eye with macular edema  H34.8310 Intravitreal Injection, Pharmacologic Agent - OD - Right Eye    aflibercept (EYLEA) SOLN 2 mg  2. 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 Retinal edema  H35.81 OCT, Retina - OU - Both Eyes  4. Essential hypertension  I10   5. Hypertensive retinopathy of both eyes  H35.033   6. 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 BRVO with CME OD  - s/p IVA OD #1 (10.05.20), #2 (11.02.20), #3 (11.30.20), #4 (01.07.21), #5 (02.05.21), #6 (03.05.21), #7 (04.02.21), #8 (05.04.21), #9 (06.03.21), #10 (07.22.21), #11 (08.19.21), #12 (09.16.21) -- IVA resistance  - initial exam and OCT findings suggestive of a BRAO component contributing  - s/p IVE OD #1 (10.14.21), #2 (11.11.21)  - BCVA stable OD -- 20/20  - OCT shows Interval improvement in IRF and foveal profile -- good response to IVE  - exam shows improvement in IRH/DBH and exudates  - recommend IVE OD #3 today, 12.09.21 for BRVO w/ CME  - pt wishes to proceed  - RBA of procedure discussed, questions answered  - informed consent  obtained  - see procedure note  - Avastin informed consent obtain and scanned on 01.07.21  - [employer] benefits investigation started 9.16.21 -- approved as of 10.14.21 (Good Days Copay card)  - F/U 4 weeks -- DFE/OCT/possible injection, widefield OCT through schisis  4,5. Hypertensive retinopathy OU  - pt not formally diagnosed with HTN  - discussed importance of tight BP control and its relation to problems #1-3 above  - advised discussion with PCP and possible calibration of home BP cuff with PCP's  - monitor  6. 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  - discussed diagnosis and progression  - not yet visually significant  - monitor for now  7. Retinoschisis OU  - shallow schisis inferotemporal periphery -- stable  - stable on widefield OCT  - no RT/RD on scleral depression  - pt asymptomatic  - monitor   Ophthalmic Meds Ordered this visit:  Meds ordered this encounter  Medications  . aflibercept (EYLEA) SOLN 2 mg       Return in about 4 weeks (around 06/01/2020) for f/u BRVO OD, DFE, OCT.  There are no Patient Instructions on file for this visit.   Explained the diagnoses, plan, and follow up with the patient and they expressed understanding.  Patient expressed understanding of the importance of proper follow up care.   This document serves as a record of services personally performed by Karie ChimeraBrian G. Vanya Carberry, MD, PhD. It was created on their behalf by Joni Reiningobin Hodges, an ophthalmic technician. The creation of this record is the provider's dictation and/or activities during the visit.    Electronically signed by: Joni Reiningobin Hodges COA, 05/04/20  10:02 PM   This document serves as a record of services personally performed by Karie ChimeraBrian G. Farrell Broerman, MD, PhD. It was created on their behalf by Glee ArvinAmanda J. Manson PasseyBrown, OA an ophthalmic technician. The creation of this record is the provider's dictation and/or  activities during the visit.    Electronically signed by: Glee ArvinAmanda J. Manson PasseyBrown, [place] 12.09.2021 10:02 PM  Karie ChimeraBrian G. Kacin Dancy, M.D., Ph.D. Diseases & Surgery of the Retina and Vitreous [facility]  I have reviewed the above documentation for accuracy and completeness, and I agree with the above. Karie Chimera, M.D., Ph.D. 05/04/20 10:02 PM   Abbreviations: M myopia (nearsighted); A astigmatism; H hyperopia (farsighted); P presbyopia; Mrx spectacle prescription;  CTL contact lenses; OD right eye; OS left eye; OU both eyes  XT exotropia; ET esotropia; PEK punctate epithelial keratitis; PEE punctate epithelial erosions; DES dry eye syndrome; MGD meibomian gland dysfunction; ATs artificial tears; PFAT's preservative free artificial tears; NSC nuclear sclerotic cataract; PSC posterior subcapsular cataract; ERM epi-retinal membrane; PVD posterior vitreous detachment; RD retinal detachment; DM diabetes mellitus; DR diabetic retinopathy; NPDR non-proliferative diabetic retinopathy; PDR proliferative diabetic retinopathy; CSME clinically significant macular edema; DME diabetic macular edema; dbh dot blot hemorrhages; CWS cotton wool spot; POAG primary open angle glaucoma; C/D cup-to-disc ratio; HVF humphrey visual field; GVF goldmann visual field; OCT optical coherence tomography; IOP intraocular pressure; BRVO Branch retinal vein occlusion; CRVO central retinal vein occlusion; CRAO central retinal artery occlusion; BRAO branch retinal artery occlusion; RT retinal tear; SB scleral buckle; PPV pars plana vitrectomy; VH Vitreous hemorrhage; PRP panretinal laser photocoagulation; IVK intravitreal kenalog; VMT vitreomacular traction; MH Macular hole;  NVD neovascularization of the disc; NVE neovascularization elsewhere; AREDS age related eye disease study; ARMD age related macular degeneration; POAG primary open angle glaucoma; EBMD epithelial/anterior basement membrane dystrophy; ACIOL anterior chamber  intraocular lens; IOL intraocular lens; PCIOL posterior chamber intraocular lens; Phaco/IOL phacoemulsification with intraocular lens placement; PRK photorefractive keratectomy; LASIK laser assisted in situ keratomileusis; HTN hypertension; DM diabetes mellitus; COPD chronic obstructive pulmonary disease

## 2020-05-04 ENCOUNTER — Other Ambulatory Visit: Payer: Self-pay

## 2020-05-04 ENCOUNTER — Encounter (INDEPENDENT_AMBULATORY_CARE_PROVIDER_SITE_OTHER): Payer: Self-pay | Admitting: Ophthalmology

## 2020-05-04 ENCOUNTER — Ambulatory Visit (INDEPENDENT_AMBULATORY_CARE_PROVIDER_SITE_OTHER): Payer: 59 | Admitting: Ophthalmology

## 2020-05-04 DIAGNOSIS — H25813 Combined forms of age-related cataract, bilateral: Secondary | ICD-10-CM

## 2020-05-04 DIAGNOSIS — H33103 Unspecified retinoschisis, bilateral: Secondary | ICD-10-CM

## 2020-05-04 DIAGNOSIS — I10 Essential (primary) hypertension: Secondary | ICD-10-CM | POA: Diagnosis not present

## 2020-05-04 DIAGNOSIS — H34231 Retinal artery branch occlusion, right eye: Secondary | ICD-10-CM

## 2020-05-04 DIAGNOSIS — H3581 Retinal edema: Secondary | ICD-10-CM | POA: Diagnosis not present

## 2020-05-04 DIAGNOSIS — H34831 Tributary (branch) retinal vein occlusion, right eye, with macular edema: Secondary | ICD-10-CM

## 2020-05-04 DIAGNOSIS — H35033 Hypertensive retinopathy, bilateral: Secondary | ICD-10-CM

## 2020-05-04 MED ORDER — AFLIBERCEPT 2MG/0.05ML IZ SOLN FOR KALEIDOSCOPE
2.0000 mg | INTRAVITREAL | Status: AC | PRN
  Administered 2020-05-04: 2 mg via INTRAVITREAL

## 2020-06-07 NOTE — Progress Notes (Signed)
[facility] Note  06/08/2020     CHIEF COMPLAINT Patient presents for Retina Follow Up   HISTORY OF PRESENT ILLNESS: Jacqueline Greer is a 60 y.o. female who presents to the clinic today for:   HPI    Retina Follow Up    Patient presents with  CRVO/BRVO.  In right eye.  This started weeks ago.  Severity is moderate.  Duration of weeks.  Since onset it is stable.  I, the attending physician,  performed the HPI with the patient and updated documentation appropriately.          Comments    Pt states vision is the same OU.  Pt denies eye pain or discomfort and denies any new or worsening floaters or fol OU.       Last edited by Bernarda Caffey, MD on 06/08/2020 11:15 AM. (History)    pt states   Referring physician: Julian Hy, PA-C [place] ,  [place] [address]  HISTORICAL INFORMATION:   Selected notes from the MEDICAL RECORD NUMBER Referred by Dr. Parke Simmers for concern of BRVO   CURRENT MEDICATIONS: No current outpatient medications on file. (Ophthalmic Drugs)   No current facility-administered medications for this visit. (Ophthalmic Drugs)   Current Outpatient Medications (Other)  Medication Sig  . omeprazole (PRILOSEC) 20 MG capsule Take 20 mg by mouth daily.     No current facility-administered medications for this visit. (Other)      REVIEW OF SYSTEMS: ROS    Positive for: Eyes   Negative for: Constitutional, Gastrointestinal, Neurological, Skin, Genitourinary, Musculoskeletal, HENT, Endocrine, Cardiovascular, Respiratory, Psychiatric, Allergic/Imm, Heme/Lymph   Last edited by Doneen Poisson on 06/08/2020  9:15 AM. (History)       ALLERGIES No Known Allergies  PAST MEDICAL HISTORY Past Medical History:  Diagnosis Date  . Cataract    OU  . Hypertensive retinopathy    OU   Past Surgical History:  Procedure Laterality Date  . LAPAROSCOPIC TUBAL LIGATION  2003    FAMILY HISTORY Family History   Problem Relation Age of Onset  . Diabetes Mother   . Kidney failure Father     SOCIAL HISTORY Social History   Tobacco Use  . Smoking status: Never Smoker  . Smokeless tobacco: Never Used  Vaping Use  . Vaping Use: Never used  Substance Use Topics  . Alcohol use: No  . Drug use: No         OPHTHALMIC EXAM:  Base Eye Exam    Visual Acuity (Snellen - Linear)      Right Left   Dist cc 20/20 -1 20/30 -1   Dist ph cc  20/20 -1   Correction: Glasses       Tonometry (Tonopen, 9:15 AM)      Right Left   Pressure 11 13       Pupils      Dark Light Shape React APD   Right 4 3 Round Brisk 0   Left 4 3 Round Brisk 0       Visual Fields      Left Right   Restrictions Partial outer superior nasal deficiency        Extraocular Movement      Right Left    Full Full       Neuro/Psych    Oriented x3: Yes   Mood/Affect: Normal       Dilation    Both eyes: 1.0% Mydriacyl, 2.5% Phenylephrine @  9:15 AM        Slit Lamp and Fundus Exam    Slit Lamp Exam      Right Left   Lids/Lashes Normal Normal   Conjunctiva/Sclera Nasal and temporal Pinguecula, Melanosis Nasal and Pinguecula, Melanosis   Cornea trace Punctate epithelial erosions trace Punctate epithelial erosions   Anterior Chamber Deep and quiet Deep and quiet   Iris Round and dilated, No NVI Round and dilated, No NVI   Lens 2+ Nuclear sclerosis, 2+ Cortical cataract 2+ Nuclear sclerosis, 2+ Cortical cataract   Vitreous mild Vitreous syneresis, Posterior vitreous detachment mild Vitreous syneresis       Fundus Exam      Right Left   Disc Pink and Sharp, temporal PPP Pink and Sharp, temporal PPP, peripapillary cystic changes, no heme   C/D Ratio 0.5 0.5   Macula Superior BRVO with central CME -- improved; superior IRH -- resolved; persistent clusters of DBH temporal macula (improving) -- focal exudates and persistent cystic changes SN mac Flat, Good foveal reflex, RPE mottling and clumping, No heme or  edema, geographic area of hypopigmented RPE nasal and superior to fovea -- stable from prior   Vessels Vascular attenuation, Tortuous, AV crossing changes, ST arterioles with severe attenuation and sclerosis, superior BRVO/BRAO Vascular attenuation, Tortuous, AV crossing changes, peripheral sclerosis ST periphery   Periphery Attached, DBH temporal periphery -- extension of BRVO, schisis cavity IT periphery (from 0700-0900) - stable; focal pigmented CR scar at 0130 midzone, no RT/RD Attached, no heme, WWP temporal and inferior quadrants, shallow schisis IT periphery          IMAGING AND PROCEDURES  Imaging and Procedures for _0 @  OCT, Retina - OU - Both Eyes       Right Eye Quality was good. Central Foveal Thickness: 263. Progression has worsened. Findings include intraretinal fluid, intraretinal hyper-reflective material, normal foveal contour, no SRF (Mild Interval increase in focal edema SN macula, stable schisis IT periphery caught on widefield).   Left Eye Quality was good. Central Foveal Thickness: 238. Progression has improved. Findings include normal foveal contour, no IRF, no SRF (Partial PVD, Shallow IT Retinoschisis caught on widefield, interval improvement in focal peripapillary IRF nasal macula).   Notes *Images captured and stored on drive  Diagnosis / Impression:  OD: superior BRVO with CME, ?BRAO component -- Mild Interval increase in focal edema SN macula, stable schisis IT periphery caught on widefield OS: NFP, no IRF/SRF; Partial PVD, Shallow IT Retinoschisis caught on widefield, interval improvement in focal peripapillary IRF nasal macula  Clinical management:  See below  Abbreviations: NFP - Normal foveal profile. CME - cystoid macular edema. PED - pigment epithelial detachment. IRF - intraretinal fluid. SRF - subretinal fluid. EZ - ellipsoid zone. ERM - epiretinal membrane. ORA - outer retinal atrophy. ORT - outer retinal tubulation. SRHM - subretinal  hyper-reflective material        Intravitreal Injection, Pharmacologic Agent - OD - Right Eye       Time Out 06/08/2020. 10:33 AM. Confirmed correct patient, procedure, site, and patient consented.   Anesthesia Topical anesthesia was used. Anesthetic medications included Lidocaine 2%, Proparacaine 0.5%.   Procedure Preparation included 5% betadine to ocular surface, eyelid speculum. A (32g) needle was used.   Injection:  2 mg aflibercept Alfonse Flavors) SOLN   NDC: A3590391, Lot: [id number], Expiration date: 08/25/2020   Route: Intravitreal, Site: Right Eye, Waste: 0.05 mL  Post-op Post injection exam found visual acuity of at least counting fingers. The patient  tolerated the procedure well. There were no complications. The patient received written and verbal post procedure care education. Post injection medications were not given.                 ASSESSMENT/PLAN:    ICD-10-CM   1. Branch retinal vein occlusion of right eye with macular edema  H34.8310 Intravitreal Injection, Pharmacologic Agent - OD - Right Eye    aflibercept (EYLEA) SOLN 2 mg  2. 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 Retinal edema  H35.81 OCT, Retina - OU - Both Eyes  4. Essential hypertension  I10   5. Hypertensive retinopathy of both eyes  H35.033   6. 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 BRVO with CME OD  - s/p IVA OD #1 (10.05.20), #2 (11.02.20), #3 (11.30.20), #4 (01.07.21), #5 (02.05.21), #6 (03.05.21), #7 (04.02.21), #8 (05.04.21), #9 (06.03.21), #10 (07.22.21), #11 (08.19.21), #12 (09.16.21) -- IVA resistance  - initial exam and OCT findings suggestive of a BRAO component contributing  - s/p IVE OD #1 (10.14.21), #2 (11.11.21), #3 12.09.21  - BCVA stable OD -- 20/20  - OCT shows interval focal increase in IRF sup nasal macula  - exam shows persistent IRH/DBH and exudates  - recommend IVE OD #4 today,  01.13.22 for BRVO w/ CME  - pt wishes to proceed  - RBA of procedure discussed, questions answered  - informed consent obtained  - see procedure note  - Avastin informed consent obtain and scanned on 01.07.21  - Eyle[employer] benefits investigation started 9.16.21 -- approved as of 10.14.21 (Good Days Copay card)  - F/U 4 weeks -- DFE/OCT/possible injection, widefield OCT through schisis  4,5. Hypertensive retinopathy OU  - pt not formally diagnosed with HTN  - discussed importance of tight BP control and its relation to problems #1-3 above  - advised discussion with PCP and possible calibration of home BP cuff with PCP's  - monitor  6. 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  - discussed diagnosis and progression  - not yet visually significant  - monitor for now  7. Retinoschisis OU  - shallow schisis inferotemporal periphery -- stable  - stable on widefield OCT  - no RT/RD on scleral depression  - pt asymptomatic  - monitor   Ophthalmic Meds Ordered this visit:  Meds ordered this encounter  Medications  . aflibercept (EYLEA) SOLN 2 mg       Return in about 4 weeks (around 07/06/2020) for f/u BRVO OD, DFE, OCT.  There are no Patient Instructions on file for this visit.   Explained the diagnoses, plan, and follow up with the patient and they expressed understanding.  Patient expressed understanding of the importance of proper follow up care.   This document serves as a record of services personally performed by Gardiner Sleeper, MD, PhD. It was created on their behalf by Leonie Douglas, an ophthalmic technician. The creation of this record is the provider's dictation and/or activities during the visit.    Electronically signed by: Leonie Douglas COA, 06/08/20  11:39 AM   This document serves as a record of services personally performed by Gardiner Sleeper, MD, PhD. It was created on  their behalf by San Jetty. Owens Shark, OA an ophthalmic technician. The creation of this record is the provider's  dictation and/or activities during the visit.    Electronically signed by: San Jetty. Marguerita Merles 01.13.2022 11:39 AM   Gardiner Sleeper, M.D., Ph.D. Diseases & Surgery of the Retina and Vitreous [facility]  I have reviewed the above documentation for accuracy and completeness, and I agree with the above. Gardiner Sleeper, M.D., Ph.D. 06/08/20 11:39 AM  Abbreviations: M myopia (nearsighted); A astigmatism; H hyperopia (farsighted); P presbyopia; Mrx spectacle prescription;  CTL contact lenses; OD right eye; OS left eye; OU both eyes  XT exotropia; ET esotropia; PEK punctate epithelial keratitis; PEE punctate epithelial erosions; DES dry eye syndrome; MGD meibomian gland dysfunction; ATs artificial tears; PFAT's preservative free artificial tears; [place] nuclear sclerotic cataract; PSC posterior subcapsular cataract; ERM epi-retinal membrane; PVD posterior vitreous detachment; RD retinal detachment; DM diabetes mellitus; DR diabetic retinopathy; NPDR non-proliferative diabetic retinopathy; PDR proliferative diabetic retinopathy; CSME clinically significant macular edema; DME diabetic macular edema; dbh dot blot hemorrhages; CWS cotton wool spot; POAG primary open angle glaucoma; C/D cup-to-disc ratio; HVF humphrey visual field; GVF goldmann visual field; OCT optical coherence tomography; IOP intraocular pressure; BRVO Branch retinal vein occlusion; CRVO central retinal vein occlusion; CRAO central retinal artery occlusion; BRAO branch retinal artery occlusion; RT retinal tear; SB scleral buckle; PPV pars plana vitrectomy; VH Vitreous hemorrhage; PRP panretinal laser photocoagulation; IVK intravitreal kenalog; VMT vitreomacular traction; MH Macular hole;  NVD neovascularization of the disc; NVE neovascularization elsewhere; AREDS age related eye disease study; ARMD age related macular  degeneration; POAG primary open angle glaucoma; EBMD epithelial/anterior basement membrane dystrophy; ACIOL anterior chamber intraocular lens; IOL intraocular lens; PCIOL posterior chamber intraocular lens; Phaco/IOL phacoemulsification with intraocular lens placement; [place] photorefractive keratectomy; LASIK laser assisted in situ keratomileusis; HTN hypertension; DM diabetes mellitus; COPD chronic obstructive pulmonary disease

## 2020-06-08 ENCOUNTER — Other Ambulatory Visit: Payer: Self-pay

## 2020-06-08 ENCOUNTER — Ambulatory Visit (INDEPENDENT_AMBULATORY_CARE_PROVIDER_SITE_OTHER): Payer: 59 | Admitting: Ophthalmology

## 2020-06-08 ENCOUNTER — Encounter (INDEPENDENT_AMBULATORY_CARE_PROVIDER_SITE_OTHER): Payer: Self-pay | Admitting: Ophthalmology

## 2020-06-08 DIAGNOSIS — H33103 Unspecified retinoschisis, bilateral: Secondary | ICD-10-CM

## 2020-06-08 DIAGNOSIS — H34831 Tributary (branch) retinal vein occlusion, right eye, with macular edema: Secondary | ICD-10-CM | POA: Diagnosis not present

## 2020-06-08 DIAGNOSIS — H34231 Retinal artery branch occlusion, right eye: Secondary | ICD-10-CM | POA: Diagnosis not present

## 2020-06-08 DIAGNOSIS — H3581 Retinal edema: Secondary | ICD-10-CM

## 2020-06-08 DIAGNOSIS — H25813 Combined forms of age-related cataract, bilateral: Secondary | ICD-10-CM

## 2020-06-08 DIAGNOSIS — I10 Essential (primary) hypertension: Secondary | ICD-10-CM | POA: Diagnosis not present

## 2020-06-08 DIAGNOSIS — H35033 Hypertensive retinopathy, bilateral: Secondary | ICD-10-CM

## 2020-06-08 MED ORDER — AFLIBERCEPT 2MG/0.05ML IZ SOLN FOR KALEIDOSCOPE
2.0000 mg | INTRAVITREAL | Status: AC | PRN
  Administered 2020-06-08: 2 mg via INTRAVITREAL

## 2020-07-03 NOTE — Progress Notes (Signed)
[facility] Note  07/06/2020     CHIEF COMPLAINT Patient presents for Retina Follow Up   HISTORY OF PRESENT ILLNESS: Jacqueline Greer is a 60 y.o. female who presents to the clinic today for:   HPI    Retina Follow Up    Patient presents with  CRVO/BRVO.  Duration of 4 weeks.  Since onset it is stable.  I, the attending physician,  performed the HPI with the patient and updated documentation appropriately.          Comments    4 week Retina follow up for Brvo/Cme od Patient states no vision changes noticed.       Last edited by Bernarda Caffey, MD on 07/06/2020 11:12 AM. (History)    pt states vision is doing well  Referring physician: Julian Hy, PA-C [place] ,  [place] [address]  HISTORICAL INFORMATION:   Selected notes from the MEDICAL RECORD NUMBER Referred by Dr. Parke Simmers for concern of BRVO   CURRENT MEDICATIONS: No current outpatient medications on file. (Ophthalmic Drugs)   No current facility-administered medications for this visit. (Ophthalmic Drugs)   Current Outpatient Medications (Other)  Medication Sig  . omeprazole (PRILOSEC) 20 MG capsule Take 20 mg by mouth daily.   No current facility-administered medications for this visit. (Other)      REVIEW OF SYSTEMS: ROS    Positive for: Eyes   Negative for: Constitutional, Gastrointestinal, Neurological, Skin, Genitourinary, Musculoskeletal, HENT, Endocrine, Cardiovascular, Respiratory, Psychiatric, Allergic/Imm, Heme/Lymph   Last edited by Elmore Guise, COT on 07/06/2020  9:12 AM. (History)       ALLERGIES No Known Allergies  PAST MEDICAL HISTORY Past Medical History:  Diagnosis Date  . Cataract    OU  . Hypertensive retinopathy    OU   Past Surgical History:  Procedure Laterality Date  . LAPAROSCOPIC TUBAL LIGATION  2003    FAMILY HISTORY Family History  Problem Relation Age of Onset  . Diabetes Mother   . Kidney failure Father      SOCIAL HISTORY Social History   Tobacco Use  . Smoking status: Never Smoker  . Smokeless tobacco: Never Used  Vaping Use  . Vaping Use: Never used  Substance Use Topics  . Alcohol use: No  . Drug use: No         OPHTHALMIC EXAM:  Base Eye Exam    Visual Acuity (Snellen - Linear)      Right Left   Dist cc 20/20 20/30-2   Dist ph cc  20/20-1   Correction: Glasses       Tonometry (Tonopen, 9:14 AM)      Right Left   Pressure 13 13       Pupils      Dark Light Shape React APD   Right 3 2 Round Brisk None   Left 3 2 Round Brisk None       Visual Fields (Counting fingers)      Left Right    Full Full       Extraocular Movement      Right Left    Full, Ortho Full, Ortho       Neuro/Psych    Oriented x3: Yes   Mood/Affect: Normal       Dilation    Both eyes: 1.0% Mydriacyl, 2.5% Phenylephrine @ 9:14 AM        Slit Lamp and Fundus Exam    Slit Lamp Exam  Right Left   Lids/Lashes Normal Normal   Conjunctiva/Sclera Nasal and temporal Pinguecula, Melanosis Nasal and Pinguecula, Melanosis   Cornea trace Punctate epithelial erosions trace Punctate epithelial erosions   Anterior Chamber Deep and quiet Deep and quiet   Iris Round and dilated, No NVI Round and dilated, No NVI   Lens 2+ Nuclear sclerosis, 2+ Cortical cataract 2+ Nuclear sclerosis, 2+ Cortical cataract   Vitreous mild Vitreous syneresis, Posterior vitreous detachment mild Vitreous syneresis       Fundus Exam      Right Left   Disc Pink and Sharp, temporal PPP Pink and Sharp, temporal PPP, peripapillary cystic changes, no heme   C/D Ratio 0.5 0.5   Macula Superior BRVO with central CME -- improved; superior IRH -- resolved; persistent clusters of DBH temporal macula (improving) -- focal exudates and persistent cystic changes SN mac Flat, Good foveal reflex, RPE mottling and clumping, No heme or edema, geographic area of hypopigmented RPE nasal and superior to fovea -- stable from prior    Vessels Vascular attenuation, Tortuous, AV crossing changes, ST arterioles with severe attenuation and sclerosis, superior BRVO/BRAO Vascular attenuation, Tortuous, AV crossing changes, peripheral sclerosis ST periphery   Periphery Attached, DBH temporal periphery -- extension of BRVO, schisis cavity IT periphery (from 0700-0900) - stable; focal pigmented CR scar at 0130 midzone, no RT/RD, faint CWS along ST arcades Attached, no heme, WWP temporal and inferior quadrants, shallow schisis IT periphery          IMAGING AND PROCEDURES  Imaging and Procedures for _0 @  OCT, Retina - OU - Both Eyes       Right Eye Quality was good. Central Foveal Thickness: 250. Progression has improved. Findings include intraretinal hyper-reflective material, normal foveal contour, no SRF, intraretinal fluid (Interval improvement in focal edema SN macula, stable schisis IT periphery caught on widefield).   Left Eye Quality was good. Central Foveal Thickness: 234. Progression has improved. Findings include normal foveal contour, no IRF, no SRF (Partial PVD, Shallow IT Retinoschisis caught on widefield, stable improvement in focal peripapillary IRF nasal macula).   Notes *Images captured and stored on drive  Diagnosis / Impression:  OD: superior BRVO with CME, ?BRAO component -- Mild Interval improvement in focal edema SN macula, stable schisis IT periphery caught on widefield OS: NFP, no IRF/SRF; Partial PVD, Shallow IT Retinoschisis caught on widefield, stable improvement in focal peripapillary IRF nasal macula  Clinical management:  See below  Abbreviations: NFP - Normal foveal profile. CME - cystoid macular edema. PED - pigment epithelial detachment. IRF - intraretinal fluid. SRF - subretinal fluid. EZ - ellipsoid zone. ERM - epiretinal membrane. ORA - outer retinal atrophy. ORT - outer retinal tubulation. SRHM - subretinal hyper-reflective material        Intravitreal Injection, Pharmacologic  Agent - OD - Right Eye       Time Out 07/06/2020. 9:28 AM. Confirmed correct patient, procedure, site, and patient consented.   Anesthesia Topical anesthesia was used. Anesthetic medications included Lidocaine 2%, Proparacaine 0.5%.   Procedure Preparation included 5% betadine to ocular surface, eyelid speculum. A (32g) needle was used.   Injection:  2 mg aflibercept Alfonse Flavors) SOLN   NDC: [id number], Lot: [id number], Expiration date: 09/24/2020   Route: Intravitreal, Site: Right Eye, Waste: 0.05 mL  Post-op Post injection exam found visual acuity of at least counting fingers. The patient tolerated the procedure well. There were no complications. The patient received written and verbal post procedure care education. Post injection medications  were not given.                 ASSESSMENT/PLAN:    ICD-10-CM   1. Branch retinal vein occlusion of right eye with macular edema  H34.8310 Intravitreal Injection, Pharmacologic Agent - OD - Right Eye    aflibercept (EYLEA) SOLN 2 mg  2. 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 Retinal edema  H35.81 OCT, Retina - OU - Both Eyes  4. Essential hypertension  I10   5. Hypertensive retinopathy of both eyes  H35.033   6. 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 BRVO with CME OD  - s/p IVA OD #1 (10.05.20), #2 (11.02.20), #3 (11.30.20), #4 (01.07.21), #5 (02.05.21), #6 (03.05.21), #7 (04.02.21), #8 (05.04.21), #9 (06.03.21), #10 (07.22.21), #11 (08.19.21), #12 (09.16.21) -- IVA resistance  - initial exam and OCT findings suggestive of a BRAO component contributing  - s/p IVE OD #1 (10.14.21), #2 (11.11.21), #3 (12.09.21), #4 (01.13.22)  - BCVA stable OD -- 20/20  - OCT shows mild interval improvement in IRF sup nasal macula  - exam shows persistent IRH/DBH and exudates  - recommend IVE OD #5 today, 02.10.22 for BRVO w/ CME  - pt wishes to proceed  - RBA of  procedure discussed, questions answered  - informed consent obtained  - see procedure note  - Avastin informed consent obtain and scanned on 01.07.21  - [employer] benefits investigation started 9.16.21 -- approved for 2022 (Good Days Copay card)  - F/U 6 weeks -- DFE/OCT/possible injection, widefield OCT through schisis  4,5. Hypertensive retinopathy OU  - pt not formally diagnosed with HTN  - discussed importance of tight BP control and its relation to problems #1-3 above  - advised discussion with PCP and possible calibration of home BP cuff with PCP's  - monitor  6. 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  - discussed diagnosis and progression  - not yet visually significant  - monitor for now  7. Retinoschisis OU  - shallow schisis inferotemporal periphery -- stable  - stable on widefield OCT  - no RT/RD on scleral depression  - pt asymptomatic  - monitor   Ophthalmic Meds Ordered this visit:  Meds ordered this encounter  Medications  . aflibercept (EYLEA) SOLN 2 mg       Return in about 6 weeks (around 08/17/2020) for f/u BRVO OD, DFE, OCT.  There are no Patient Instructions on file for this visit.   Explained the diagnoses, plan, and follow up with the patient and they expressed understanding.  Patient expressed understanding of the importance of proper follow up care.   This document serves as a record of services personally performed by Gardiner Sleeper, MD, PhD. It was created on their behalf by Leonie Douglas, an ophthalmic technician. The creation of this record is the provider's dictation and/or activities during the visit.    Electronically signed by: Leonie Douglas COA, 07/06/20  11:17 AM   This document serves as a record of services personally performed by Gardiner Sleeper, MD, PhD. It was created on their behalf by San Jetty. Owens Shark, OA an ophthalmic technician. The  creation of this record is the provider's dictation and/or activities during the visit.    Electronically signed by: San Jetty. Owens Shark, OA 02.10.2022 11:17 AM  Gardiner Sleeper, M.D., Ph.D. Diseases & Surgery of the Retina and Vitreous [facility]  I have reviewed the above documentation for accuracy and completeness, and I agree with the above. Gardiner Sleeper, M.D., Ph.D. 07/06/20 11:17 AM   Abbreviations: M myopia (nearsighted); A astigmatism; H hyperopia (farsighted); P presbyopia; Mrx spectacle prescription;  CTL contact lenses; OD right eye; OS left eye; OU both eyes  XT exotropia; ET esotropia; PEK punctate epithelial keratitis; PEE punctate epithelial erosions; DES dry eye syndrome; MGD meibomian gland dysfunction; ATs artificial tears; PFAT's preservative free artificial tears; [place] nuclear sclerotic cataract; PSC posterior subcapsular cataract; ERM epi-retinal membrane; PVD posterior vitreous detachment; RD retinal detachment; DM diabetes mellitus; DR diabetic retinopathy; NPDR non-proliferative diabetic retinopathy; PDR proliferative diabetic retinopathy; CSME clinically significant macular edema; DME diabetic macular edema; dbh dot blot hemorrhages; CWS cotton wool spot; POAG primary open angle glaucoma; C/D cup-to-disc ratio; HVF humphrey visual field; GVF goldmann visual field; OCT optical coherence tomography; IOP intraocular pressure; BRVO Branch retinal vein occlusion; CRVO central retinal vein occlusion; CRAO central retinal artery occlusion; BRAO branch retinal artery occlusion; RT retinal tear; SB scleral buckle; PPV pars plana vitrectomy; VH Vitreous hemorrhage; PRP panretinal laser photocoagulation; IVK intravitreal kenalog; VMT vitreomacular traction; MH Macular hole;  NVD neovascularization of the disc; NVE neovascularization elsewhere; AREDS age related eye disease study; ARMD age related macular degeneration; POAG primary open angle glaucoma; EBMD  epithelial/anterior basement membrane dystrophy; ACIOL anterior chamber intraocular lens; IOL intraocular lens; PCIOL posterior chamber intraocular lens; Phaco/IOL phacoemulsification with intraocular lens placement; [place] photorefractive keratectomy; LASIK laser assisted in situ keratomileusis; HTN hypertension; DM diabetes mellitus; COPD chronic obstructive pulmonary disease

## 2020-07-06 ENCOUNTER — Encounter (INDEPENDENT_AMBULATORY_CARE_PROVIDER_SITE_OTHER): Payer: Self-pay | Admitting: Ophthalmology

## 2020-07-06 ENCOUNTER — Ambulatory Visit (INDEPENDENT_AMBULATORY_CARE_PROVIDER_SITE_OTHER): Payer: 59 | Admitting: Ophthalmology

## 2020-07-06 ENCOUNTER — Other Ambulatory Visit: Payer: Self-pay

## 2020-07-06 DIAGNOSIS — I10 Essential (primary) hypertension: Secondary | ICD-10-CM | POA: Diagnosis not present

## 2020-07-06 DIAGNOSIS — H35033 Hypertensive retinopathy, bilateral: Secondary | ICD-10-CM

## 2020-07-06 DIAGNOSIS — H34231 Retinal artery branch occlusion, right eye: Secondary | ICD-10-CM | POA: Diagnosis not present

## 2020-07-06 DIAGNOSIS — H34831 Tributary (branch) retinal vein occlusion, right eye, with macular edema: Secondary | ICD-10-CM | POA: Diagnosis not present

## 2020-07-06 DIAGNOSIS — H25813 Combined forms of age-related cataract, bilateral: Secondary | ICD-10-CM

## 2020-07-06 DIAGNOSIS — H33103 Unspecified retinoschisis, bilateral: Secondary | ICD-10-CM

## 2020-07-06 DIAGNOSIS — H3581 Retinal edema: Secondary | ICD-10-CM | POA: Diagnosis not present

## 2020-07-06 MED ORDER — AFLIBERCEPT 2MG/0.05ML IZ SOLN FOR KALEIDOSCOPE
2.0000 mg | INTRAVITREAL | Status: AC | PRN
  Administered 2020-07-06: 2 mg via INTRAVITREAL

## 2020-08-17 ENCOUNTER — Encounter (INDEPENDENT_AMBULATORY_CARE_PROVIDER_SITE_OTHER): Payer: 59 | Admitting: Ophthalmology

## 2020-08-25 NOTE — Progress Notes (Signed)
[facility] Note  08/29/2020     CHIEF COMPLAINT Patient presents for Retina Follow Up   HISTORY OF PRESENT ILLNESS: Jacqueline Greer is a 60 y.o. female who presents to the clinic today for:   HPI    Retina Follow Up    Patient presents with  CRVO/BRVO.  In right eye.  Severity is mild.  Since onset it is stable.  I, the attending physician,  performed the HPI with the patient and updated documentation appropriately.          Comments    Pt states vision is stable, no new fol or floaters, no drops       Last edited by Bernarda Caffey, MD on 08/29/2020 11:51 AM. (History)      Referring physician: Julian Hy, PA-C [place] ,  [place] [address]  HISTORICAL INFORMATION:   Selected notes from the MEDICAL RECORD NUMBER Referred by Dr. Parke Simmers for concern of BRVO   CURRENT MEDICATIONS: No current outpatient medications on file. (Ophthalmic Drugs)   No current facility-administered medications for this visit. (Ophthalmic Drugs)   Current Outpatient Medications (Other)  Medication Sig  . omeprazole (PRILOSEC) 20 MG capsule Take 20 mg by mouth daily.   No current facility-administered medications for this visit. (Other)      REVIEW OF SYSTEMS: ROS    Positive for: Cardiovascular, Eyes   Negative for: Constitutional, Gastrointestinal, Neurological, Skin, Genitourinary, Musculoskeletal, HENT, Endocrine, Respiratory, Psychiatric, Allergic/Imm, Heme/Lymph   Last edited by Debbrah Alar, COT on 08/29/2020  8:21 AM. (History)       ALLERGIES No Known Allergies  PAST MEDICAL HISTORY Past Medical History:  Diagnosis Date  . Cataract    OU  . Hypertensive retinopathy    OU   Past Surgical History:  Procedure Laterality Date  . LAPAROSCOPIC TUBAL LIGATION  2003    FAMILY HISTORY Family History  Problem Relation Age of Onset  . Diabetes Mother   . Kidney failure Father     SOCIAL HISTORY Social History    Tobacco Use  . Smoking status: Never Smoker  . Smokeless tobacco: Never Used  Vaping Use  . Vaping Use: Never used  Substance Use Topics  . Alcohol use: No  . Drug use: No         OPHTHALMIC EXAM:  Base Eye Exam    Visual Acuity (Snellen - Linear)      Right Left   Dist cc 20/25 -2 20/20 -1   Dist ph cc 20/20 -1 NI   Correction: Glasses       Tonometry (Tonopen, 8:29 AM)      Right Left   Pressure 19 24       Tonometry #2 (Tonopen, 8:29 AM)      Right Left   Pressure  22       Pupils      Dark Light Shape React APD   Right 4 2 Round Brisk None   Left 4 2 Round Brisk None       Visual Fields (Counting fingers)      Left Right    Full Full       Extraocular Movement      Right Left    Full, Ortho Full, Ortho       Neuro/Psych    Oriented x3: Yes   Mood/Affect: Normal       Dilation    Both eyes: 1.0% Mydriacyl, 2.5% Phenylephrine @  8:29 AM        Slit Lamp and Fundus Exam    Slit Lamp Exam      Right Left   Lids/Lashes Normal Normal   Conjunctiva/Sclera Nasal and temporal Pinguecula, Melanosis Nasal and Pinguecula, Melanosis   Cornea trace Punctate epithelial erosions trace Punctate epithelial erosions   Anterior Chamber Deep and quiet Deep and quiet   Iris Round and dilated, No NVI Round and dilated, No NVI   Lens 2+ Nuclear sclerosis, 2+ Cortical cataract 2+ Nuclear sclerosis, 2+ Cortical cataract   Vitreous mild Vitreous syneresis, Posterior vitreous detachment mild Vitreous syneresis       Fundus Exam      Right Left   Disc Pink and Sharp, temporal PPP Pink and Sharp, temporal PPP, peripapillary cystic changes, no heme   C/D Ratio 0.5 0.5   Macula Superior BRVO with central CME -- improved; persistent clusters of DBH temporal macula (improving) -- focal exudates and persistent cystic changes SN mac Flat, Good foveal reflex, RPE mottling and clumping, No heme or edema, geographic area of hypopigmented RPE nasal and superior to fovea --  stable from prior   Vessels Vascular attenuation, Tortuous, AV crossing changes, ST arterioles with severe attenuation and sclerosis, superior BRVO/BRAO Vascular attenuation, Tortuous, AV crossing changes, peripheral sclerosis ST periphery   Periphery Attached, DBH temporal periphery -- extension of BRVO, schisis cavity IT periphery (from 0700-0900) - stable; focal pigmented CR scar at 0130 midzone, no RT/RD, faint CWS along ST arcades Attached, no heme, WWP temporal and inferior quadrants, shallow schisis IT periphery          IMAGING AND PROCEDURES  Imaging and Procedures for _0 @  OCT, Retina - OU - Both Eyes       Right Eye Quality was good. Central Foveal Thickness: 251. Progression has been stable. Findings include intraretinal hyper-reflective material, normal foveal contour, no SRF, intraretinal fluid (Persistent cystic changes superior macula, greatest SN, stable schisis IT periphery caught on widefield).   Left Eye Quality was good. Central Foveal Thickness: 241. Progression has been stable. Findings include normal foveal contour, no IRF, no SRF (Partial PVD, Shallow IT Retinoschisis caught on widefield, stable improvement in focal peripapillary IRF nasal macula).   Notes *Images captured and stored on drive  Diagnosis / Impression:  OD: superior BRVO with CME, ?BRAO component -- Persistent cystic changes superior macula, greatest SN, stable schisis IT periphery caught on widefield OS: NFP, no IRF/SRF; Partial PVD, Shallow IT Retinoschisis caught on widefield, stable improvement in focal peripapillary IRF nasal macula  Clinical management:  See below  Abbreviations: NFP - Normal foveal profile. CME - cystoid macular edema. PED - pigment epithelial detachment. IRF - intraretinal fluid. SRF - subretinal fluid. EZ - ellipsoid zone. ERM - epiretinal membrane. ORA - outer retinal atrophy. ORT - outer retinal tubulation. SRHM - subretinal hyper-reflective material         Intravitreal Injection, Pharmacologic Agent - OD - Right Eye       Time Out 08/29/2020. 9:09 AM. Confirmed correct patient, procedure, site, and patient consented.   Anesthesia Topical anesthesia was used. Anesthetic medications included Lidocaine 2%, Proparacaine 0.5%.   Procedure Preparation included 5% betadine to ocular surface, eyelid speculum. A (32g) needle was used.   Injection:  2 mg aflibercept Alfonse Flavors) SOLN   NDC: [id number], Lot: [id number], Expiration date: 04/25/2021   Route: Intravitreal, Site: Right Eye, Waste: 0.05 mL  Post-op Post injection exam found visual acuity of at least counting fingers. The  patient tolerated the procedure well. There were no complications. The patient received written and verbal post procedure care education. Post injection medications were not given.                 ASSESSMENT/PLAN:    ICD-10-CM   1. Branch retinal vein occlusion of right eye with macular edema  H34.8310 Intravitreal Injection, Pharmacologic Agent - OD - Right Eye    aflibercept (EYLEA) SOLN 2 mg  2. 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 Retinal edema  H35.81 OCT, Retina - OU - Both Eyes  4. Essential hypertension  I10   5. Hypertensive retinopathy of both eyes  H35.033   6. 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 BRVO with CME OD  - s/p IVA OD #1 (10.05.20), #2 (11.02.20), #3 (11.30.20), #4 (01.07.21), #5 (02.05.21), #6 (03.05.21), #7 (04.02.21), #8 (05.04.21), #9 (06.03.21), #10 (07.22.21), #11 (08.19.21), #12 (09.16.21) -- IVA resistance  - initial exam and OCT findings suggestive of a BRAO component contributing  - s/p IVE OD #1 (10.14.21), #2 (11.11.21), #3 (12.09.21), #4 (01.13.22), #5 (2.10.22)  - BCVA stable OD -- 20/20  - OCT shows persistent cystic changes superior macula, greatest SN  - exam shows persistent IRH/DBH and exudates  - recommend IVE OD #6 today,  04.05.22 for BRVO w/ CME  - pt wishes to proceed  - RBA of procedure discussed, questions answered  - informed consent obtained  - see procedure note  - Avastin informed consent obtain and scanned on 01.07.21  - [employer] benefits investigation started 9.16.21 -- approved for 2022 (Good Days Copay card)  - F/U 6-7 weeks -- DFE/OCT/possible injection, widefield OCT through schisis  4,5. Hypertensive retinopathy OU  - pt not formally diagnosed with HTN  - discussed importance of tight BP control and its relation to problems #1-3 above  - advised discussion with PCP and possible calibration of home BP cuff with PCP's  - monitor  6. 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  - discussed diagnosis and progression  - not yet visually significant  - monitor for now  7. Retinoschisis OU  - shallow schisis inferotemporal periphery -- stable  - stable on widefield OCT  - no RT/RD on scleral depression  - pt asymptomatic  - monitor   Ophthalmic Meds Ordered this visit:  Meds ordered this encounter  Medications  . aflibercept (EYLEA) SOLN 2 mg       Return for f/u 6-7 weeks, BRVO OD, DFE, OCT.  There are no Patient Instructions on file for this visit.   Explained the diagnoses, plan, and follow up with the patient and they expressed understanding.  Patient expressed understanding of the importance of proper follow up care.   This document serves as a record of services personally performed by Gardiner Sleeper, MD, PhD. It was created on their behalf by Estill Bakes, COT an ophthalmic technician. The creation of this record is the provider's dictation and/or activities during the visit.    Electronically signed by: Estill Bakes, COT 4.1.22 @ 11:55 AM   This document serves as a record of services personally performed by Gardiner Sleeper, MD, PhD. It was created on their behalf by San Jetty.  Owens Shark, OA an ophthalmic technician. The creation of this record is the provider's  dictation and/or activities during the visit.    Electronically signed by: San Jetty. Owens Shark, [place] 04.05.2022 11:55 AM  Gardiner Sleeper, M.D., Ph.D. Diseases & Surgery of the Retina and [place] 08/29/2020   I have reviewed the above documentation for accuracy and completeness, and I agree with the above. Gardiner Sleeper, M.D., Ph.D. 08/29/20 11:55 AM   Abbreviations: M myopia (nearsighted); A astigmatism; H hyperopia (farsighted); P presbyopia; Mrx spectacle prescription;  CTL contact lenses; OD right eye; OS left eye; OU both eyes  XT exotropia; ET esotropia; PEK punctate epithelial keratitis; PEE punctate epithelial erosions; DES dry eye syndrome; MGD meibomian gland dysfunction; ATs artificial tears; PFAT's preservative free artificial tears; [place] nuclear sclerotic cataract; PSC posterior subcapsular cataract; ERM epi-retinal membrane; PVD posterior vitreous detachment; RD retinal detachment; DM diabetes mellitus; DR diabetic retinopathy; NPDR non-proliferative diabetic retinopathy; PDR proliferative diabetic retinopathy; CSME clinically significant macular edema; DME diabetic macular edema; dbh dot blot hemorrhages; CWS cotton wool spot; POAG primary open angle glaucoma; C/D cup-to-disc ratio; HVF humphrey visual field; GVF goldmann visual field; OCT optical coherence tomography; IOP intraocular pressure; BRVO Branch retinal vein occlusion; CRVO central retinal vein occlusion; CRAO central retinal artery occlusion; BRAO branch retinal artery occlusion; RT retinal tear; SB scleral buckle; PPV pars plana vitrectomy; VH Vitreous hemorrhage; PRP panretinal laser photocoagulation; IVK intravitreal kenalog; VMT vitreomacular traction; MH Macular hole;  NVD neovascularization of the disc; NVE neovascularization elsewhere; AREDS age related eye disease study; ARMD age related macular  degeneration; POAG primary open angle glaucoma; EBMD epithelial/anterior basement membrane dystrophy; ACIOL anterior chamber intraocular lens; IOL intraocular lens; PCIOL posterior chamber intraocular lens; Phaco/IOL phacoemulsification with intraocular lens placement; [place] photorefractive keratectomy; LASIK laser assisted in situ keratomileusis; HTN hypertension; DM diabetes mellitus; COPD chronic obstructive pulmonary disease

## 2020-08-29 ENCOUNTER — Other Ambulatory Visit: Payer: Self-pay

## 2020-08-29 ENCOUNTER — Encounter (INDEPENDENT_AMBULATORY_CARE_PROVIDER_SITE_OTHER): Payer: Self-pay | Admitting: Ophthalmology

## 2020-08-29 ENCOUNTER — Ambulatory Visit (INDEPENDENT_AMBULATORY_CARE_PROVIDER_SITE_OTHER): Payer: 59 | Admitting: Ophthalmology

## 2020-08-29 DIAGNOSIS — I10 Essential (primary) hypertension: Secondary | ICD-10-CM | POA: Diagnosis not present

## 2020-08-29 DIAGNOSIS — H34831 Tributary (branch) retinal vein occlusion, right eye, with macular edema: Secondary | ICD-10-CM | POA: Diagnosis not present

## 2020-08-29 DIAGNOSIS — H34231 Retinal artery branch occlusion, right eye: Secondary | ICD-10-CM | POA: Diagnosis not present

## 2020-08-29 DIAGNOSIS — H33103 Unspecified retinoschisis, bilateral: Secondary | ICD-10-CM

## 2020-08-29 DIAGNOSIS — H25813 Combined forms of age-related cataract, bilateral: Secondary | ICD-10-CM

## 2020-08-29 DIAGNOSIS — H35033 Hypertensive retinopathy, bilateral: Secondary | ICD-10-CM

## 2020-08-29 DIAGNOSIS — H3581 Retinal edema: Secondary | ICD-10-CM | POA: Diagnosis not present

## 2020-08-29 MED ORDER — AFLIBERCEPT 2MG/0.05ML IZ SOLN FOR KALEIDOSCOPE
2.0000 mg | INTRAVITREAL | Status: AC | PRN
  Administered 2020-08-29: 2 mg via INTRAVITREAL

## 2020-10-11 NOTE — Progress Notes (Signed)
[facility] Note  10/19/2020     CHIEF COMPLAINT Patient presents for Retina Follow Up   HISTORY OF PRESENT ILLNESS: Jacqueline Greer is a 60 y.o. female who presents to the clinic today for:   HPI    Retina Follow Up    Patient presents with  CRVO/BRVO.  In right eye.  Severity is moderate.  Duration of 7 weeks.  Since onset it is stable.  I, the attending physician,  performed the HPI with the patient and updated documentation appropriately.          Comments    Patient states vision the same OU.        Last edited by Bernarda Caffey, MD on 10/19/2020 10:16 AM. (History)    pt states vision is stable  Referring physician: Julian Hy, PA-C [place] ,  [place] [address]  HISTORICAL INFORMATION:   Selected notes from the MEDICAL RECORD NUMBER Referred by Dr. Parke Simmers for concern of BRVO   CURRENT MEDICATIONS: No current outpatient medications on file. (Ophthalmic Drugs)   No current facility-administered medications for this visit. (Ophthalmic Drugs)   Current Outpatient Medications (Other)  Medication Sig  . omeprazole (PRILOSEC) 20 MG capsule Take 20 mg by mouth daily.   No current facility-administered medications for this visit. (Other)      REVIEW OF SYSTEMS: ROS    Positive for: Cardiovascular, Eyes   Negative for: Constitutional, Gastrointestinal, Neurological, Skin, Genitourinary, Musculoskeletal, HENT, Endocrine, Respiratory, Psychiatric, Allergic/Imm, Heme/Lymph   Last edited by Roselee Nova D, COT on 10/19/2020  8:55 AM. (History)       ALLERGIES No Known Allergies  PAST MEDICAL HISTORY Past Medical History:  Diagnosis Date  . Cataract    OU  . Hypertensive retinopathy    OU   Past Surgical History:  Procedure Laterality Date  . LAPAROSCOPIC TUBAL LIGATION  2003    FAMILY HISTORY Family History  Problem Relation Age of Onset  . Diabetes Mother   . Kidney failure Father      SOCIAL HISTORY Social History   Tobacco Use  . Smoking status: Never Smoker  . Smokeless tobacco: Never Used  Vaping Use  . Vaping Use: Never used  Substance Use Topics  . Alcohol use: No  . Drug use: No         OPHTHALMIC EXAM:  Base Eye Exam    Visual Acuity (Snellen - Linear)      Right Left   Dist cc 20/25 +2 20/20 -2   Dist ph cc 20/20 -2    Correction: Glasses       Tonometry (Tonopen, 9:01 AM)      Right Left   Pressure 12 14       Pupils      Dark Light Shape React APD   Right 4 2 Round Brisk None   Left 4 2 Round Brisk None       Visual Fields (Counting fingers)      Left Right    Full Full       Extraocular Movement      Right Left    Full, Ortho Full, Ortho       Neuro/Psych    Oriented x3: Yes   Mood/Affect: Normal       Dilation    Both eyes: 1.0% Mydriacyl, 2.5% Phenylephrine @ 9:01 AM        Slit Lamp and Fundus Exam    Slit Lamp  Exam      Right Left   Lids/Lashes Normal Normal   Conjunctiva/Sclera Nasal and temporal Pinguecula, Melanosis Nasal and Pinguecula, Melanosis   Cornea trace Punctate epithelial erosions trace Punctate epithelial erosions   Anterior Chamber Deep and quiet Deep and quiet   Iris Round and dilated, No NVI Round and dilated, No NVI   Lens 2+ Nuclear sclerosis, 2+ Cortical cataract 2+ Nuclear sclerosis, 2+ Cortical cataract   Vitreous mild Vitreous syneresis, Posterior vitreous detachment mild Vitreous syneresis       Fundus Exam      Right Left   Disc Pink and Sharp, temporal PPP Pink and Sharp, temporal PPP, peripapillary cystic changes, no heme   C/D Ratio 0.5 0.5   Macula Superior BRVO with central CME -- improved; persistent clusters of DBH temporal macula (improving) -- focal exudates and persistent cystic changes SN mac Flat, Good foveal reflex, RPE mottling and clumping, No heme or edema, geographic area of hypopigmented RPE nasal and superior to fovea -- stable from prior   Vessels Vascular  attenuation, Tortuous, AV crossing changes, ST arterioles with severe attenuation and sclerosis, superior BRVO/BRAO Vascular attenuation, Tortuous, AV crossing changes, peripheral sclerosis ST periphery   Periphery Attached, DBH temporal periphery -- extension of BRVO, schisis cavity IT periphery (from 0700-0900) - stable; focal pigmented CR scar at 0130 midzone, no RT/RD, faint CWS along ST arcades Attached, no heme, WWP temporal and inferior quadrants, shallow schisis IT periphery          IMAGING AND PROCEDURES  Imaging and Procedures for _0 @  OCT, Retina - OU - Both Eyes       Right Eye Quality was good. Central Foveal Thickness: 250. Progression has been stable. Findings include intraretinal hyper-reflective material, normal foveal contour, no SRF, intraretinal fluid (Mild interval improvement in cystic changes superior macula, greatest SN, stable schisis IT periphery caught on widefield).   Left Eye Quality was good. Central Foveal Thickness: 235. Progression has been stable. Findings include normal foveal contour, no IRF, no SRF (Partial PVD, Shallow IT Retinoschisis caught on widefield, stable improvement in focal peripapillary IRF nasal macula).   Notes *Images captured and stored on drive  Diagnosis / Impression:  OD: superior BRVO with CME, ?BRAO component -- Mild interval improvement in cystic changes superior macula, greatest SN, stable schisis IT periphery caught on widefield OS: NFP, no IRF/SRF; Partial PVD, Shallow IT Retinoschisis caught on widefield, stable improvement in focal peripapillary IRF nasal macula  Clinical management:  See below  Abbreviations: NFP - Normal foveal profile. CME - cystoid macular edema. PED - pigment epithelial detachment. IRF - intraretinal fluid. SRF - subretinal fluid. EZ - ellipsoid zone. ERM - epiretinal membrane. ORA - outer retinal atrophy. ORT - outer retinal tubulation. SRHM - subretinal hyper-reflective material         Intravitreal Injection, Pharmacologic Agent - OD - Right Eye       Time Out 10/19/2020. 9:39 AM. Confirmed correct patient, procedure, site, and patient consented.   Anesthesia Topical anesthesia was used. Anesthetic medications included Lidocaine 2%, Proparacaine 0.5%.   Procedure Preparation included 5% betadine to ocular surface, eyelid speculum. A (32g) needle was used.   Injection:  2 mg aflibercept Alfonse Flavors) SOLN   NDC: [id number], Lot: [id number], Expiration date: 07/24/2021   Route: Intravitreal, Site: Right Eye, Waste: 0.05 mL  Post-op Post injection exam found visual acuity of at least counting fingers. The patient tolerated the procedure well. There were no complications. The patient received written  and verbal post procedure care education. Post injection medications were not given.                 ASSESSMENT/PLAN:    ICD-10-CM   1. Branch retinal vein occlusion of right eye with macular edema  H34.8310 Intravitreal Injection, Pharmacologic Agent - OD - Right Eye    aflibercept (EYLEA) SOLN 2 mg  2. 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 Retinal edema  H35.81 OCT, Retina - OU - Both Eyes  4. Essential hypertension  I10   5. Hypertensive retinopathy of both eyes  H35.033   6. 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 BRVO with CME OD  - s/p IVA OD #1 (10.05.20), #2 (11.02.20), #3 (11.30.20), #4 (01.07.21), #5 (02.05.21), #6 (03.05.21), #7 (04.02.21), #8 (05.04.21), #9 (06.03.21), #10 (07.22.21), #11 (08.19.21), #12 (09.16.21) -- IVA resistance  - initial exam and OCT findings suggestive of a BRAO component contributing  - s/p IVE OD #1 (10.14.21), #2 (11.11.21), #3 (12.09.21), #4 (01.13.22), #5 (2.10.22), #6 (04.05.22)  - BCVA stable OD -- 20/20  - OCT shows mild interval improvement in cystic changes superior macula, greatest SN  - exam shows persistent IRH/DBH and  exudates  - recommend IVE OD #7 today, 05.26.22 for BRVO w/ CME  - pt wishes to proceed  - RBA of procedure discussed, questions answered  - informed consent obtained  - see procedure note  - Avastin informed consent obtain and scanned on 01.07.21  - [employer] benefits investigation started 9.16.21 -- approved for 2022 (Good Days Copay card)  - F/U 7 weeks -- DFE/OCT/possible injection, widefield OCT through schisis  4,5. Hypertensive retinopathy OU  - pt not formally diagnosed with HTN  - discussed importance of tight BP control and its relation to problems #1-3 above  - advised discussion with PCP and possible calibration of home BP cuff with PCP's  - monitor  6. 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  - discussed diagnosis and progression  - not yet visually significant  - monitor for now  7. Retinoschisis OU  - shallow schisis inferotemporal periphery -- stable  - stable on widefield OCT  - no RT/RD on scleral depression  - pt asymptomatic  - monitor   Ophthalmic Meds Ordered this visit:  Meds ordered this encounter  Medications  . aflibercept (EYLEA) SOLN 2 mg       Return in about 7 weeks (around 12/07/2020) for f/u BRVO OD, DFE, OCT.  There are no Patient Instructions on file for this visit.   Explained the diagnoses, plan, and follow up with the patient and they expressed understanding.  Patient expressed understanding of the importance of proper follow up care.   This document serves as a record of services personally performed by Gardiner Sleeper, MD, PhD. It was created on their behalf by Leonie Douglas, an ophthalmic technician. The creation of this record is the provider's dictation and/or activities during the visit.    Electronically signed by: Leonie Douglas COA, 10/19/20  10:26 AM   This document serves as a record of services personally performed by Gardiner Sleeper, MD, PhD. It was created on their behalf by San Jetty. Owens Shark, OA an ophthalmic technician. The creation of this record is the provider's dictation and/or activities during the  visit.    Electronically signed by: San Jetty. Owens Shark, [place] 05.26.2022 10:26 AM   Gardiner Sleeper, M.D., Ph.D. Diseases & Surgery of the Retina and [place] 10/19/2020   I have reviewed the above documentation for accuracy and completeness, and I agree with the above. Gardiner Sleeper, M.D., Ph.D. 10/19/20 10:26 AM   Abbreviations: M myopia (nearsighted); A astigmatism; H hyperopia (farsighted); P presbyopia; Mrx spectacle prescription;  CTL contact lenses; OD right eye; OS left eye; OU both eyes  XT exotropia; ET esotropia; PEK punctate epithelial keratitis; PEE punctate epithelial erosions; DES dry eye syndrome; MGD meibomian gland dysfunction; ATs artificial tears; PFAT's preservative free artificial tears; [place] nuclear sclerotic cataract; PSC posterior subcapsular cataract; ERM epi-retinal membrane; PVD posterior vitreous detachment; RD retinal detachment; DM diabetes mellitus; DR diabetic retinopathy; NPDR non-proliferative diabetic retinopathy; PDR proliferative diabetic retinopathy; CSME clinically significant macular edema; DME diabetic macular edema; dbh dot blot hemorrhages; CWS cotton wool spot; POAG primary open angle glaucoma; C/D cup-to-disc ratio; HVF humphrey visual field; GVF goldmann visual field; OCT optical coherence tomography; IOP intraocular pressure; BRVO Branch retinal vein occlusion; CRVO central retinal vein occlusion; CRAO central retinal artery occlusion; BRAO branch retinal artery occlusion; RT retinal tear; SB scleral buckle; PPV pars plana vitrectomy; VH Vitreous hemorrhage; PRP panretinal laser photocoagulation; IVK intravitreal kenalog; VMT vitreomacular traction; MH Macular hole;  NVD neovascularization of the disc; NVE neovascularization elsewhere; AREDS age  related eye disease study; ARMD age related macular degeneration; POAG primary open angle glaucoma; EBMD epithelial/anterior basement membrane dystrophy; ACIOL anterior chamber intraocular lens; IOL intraocular lens; PCIOL posterior chamber intraocular lens; Phaco/IOL phacoemulsification with intraocular lens placement; [place] photorefractive keratectomy; LASIK laser assisted in situ keratomileusis; HTN hypertension; DM diabetes mellitus; COPD chronic obstructive pulmonary disease

## 2020-10-19 ENCOUNTER — Ambulatory Visit (INDEPENDENT_AMBULATORY_CARE_PROVIDER_SITE_OTHER): Payer: 59 | Admitting: Ophthalmology

## 2020-10-19 ENCOUNTER — Encounter (INDEPENDENT_AMBULATORY_CARE_PROVIDER_SITE_OTHER): Payer: Self-pay | Admitting: Ophthalmology

## 2020-10-19 ENCOUNTER — Other Ambulatory Visit: Payer: Self-pay

## 2020-10-19 DIAGNOSIS — I10 Essential (primary) hypertension: Secondary | ICD-10-CM | POA: Diagnosis not present

## 2020-10-19 DIAGNOSIS — H34831 Tributary (branch) retinal vein occlusion, right eye, with macular edema: Secondary | ICD-10-CM | POA: Diagnosis not present

## 2020-10-19 DIAGNOSIS — H34231 Retinal artery branch occlusion, right eye: Secondary | ICD-10-CM | POA: Diagnosis not present

## 2020-10-19 DIAGNOSIS — H35033 Hypertensive retinopathy, bilateral: Secondary | ICD-10-CM

## 2020-10-19 DIAGNOSIS — H3581 Retinal edema: Secondary | ICD-10-CM | POA: Diagnosis not present

## 2020-10-19 DIAGNOSIS — H25813 Combined forms of age-related cataract, bilateral: Secondary | ICD-10-CM

## 2020-10-19 DIAGNOSIS — H33103 Unspecified retinoschisis, bilateral: Secondary | ICD-10-CM

## 2020-10-19 MED ORDER — AFLIBERCEPT 2MG/0.05ML IZ SOLN FOR KALEIDOSCOPE
2.0000 mg | INTRAVITREAL | Status: AC | PRN
  Administered 2020-10-19: 2 mg via INTRAVITREAL

## 2020-12-13 NOTE — Progress Notes (Addendum)
[facility] Note  12/14/2020     CHIEF COMPLAINT Patient presents for Retina Follow Up   HISTORY OF PRESENT ILLNESS: Jacqueline Greer is a 60 y.o. female who presents to the clinic today for:   HPI     Retina Follow Up   Patient presents with  CRVO/BRVO.  In right eye.  Duration of 8 weeks.  Since onset it is stable.  I, the attending physician,  performed the HPI with the patient and updated documentation appropriately.        Comments   Pt here for 8 wk ret f/u for BRVO OD. Pt states vision is about the same, no changes or issues noted. No ocular pain or discomfort.       Last edited by Bernarda Caffey, MD on 12/14/2020 10:31 AM.     pt states vision is stable  Referring physician: Julian Hy, PA-C [place] ,  [place] [address]  HISTORICAL INFORMATION:   Selected notes from the MEDICAL RECORD NUMBER Referred by Dr. Parke Simmers for concern of BRVO   CURRENT MEDICATIONS: No current outpatient medications on file. (Ophthalmic Drugs)   No current facility-administered medications for this visit. (Ophthalmic Drugs)   Current Outpatient Medications (Other)  Medication Sig   omeprazole (PRILOSEC) 20 MG capsule Take 20 mg by mouth daily.   No current facility-administered medications for this visit. (Other)   REVIEW OF SYSTEMS: ROS   Positive for: Cardiovascular, Eyes Negative for: Constitutional, Gastrointestinal, Neurological, Skin, Genitourinary, Musculoskeletal, HENT, Endocrine, Respiratory, Psychiatric, Allergic/Imm, Heme/Lymph Last edited by Kingsley Spittle, COT on 12/14/2020  8:46 AM.     ALLERGIES No Known Allergies  PAST MEDICAL HISTORY Past Medical History:  Diagnosis Date   Cataract    OU   Hypertensive retinopathy    OU   Past Surgical History:  Procedure Laterality Date   LAPAROSCOPIC TUBAL LIGATION  2003    FAMILY HISTORY Family History  Problem Relation Age of Onset   Diabetes Mother     Kidney failure Father     SOCIAL HISTORY Social History   Tobacco Use   Smoking status: Never   Smokeless tobacco: Never  Vaping Use   Vaping Use: Never used  Substance Use Topics   Alcohol use: No   Drug use: No         OPHTHALMIC EXAM:  Base Eye Exam     Visual Acuity (Snellen - Linear)       Right Left   Dist cc 20/25 -2 20/30 +2   Dist ph cc 20/20 -2 20/25    Correction: Glasses         Tonometry (Tonopen, 8:55 AM)       Right Left   Pressure 20 19         Pupils       Dark Light Shape React APD   Right 4 2 Round Brisk None   Left 4 2 Round Brisk None         Visual Fields (Counting fingers)       Left Right    Full Full         Extraocular Movement       Right Left    Full, Ortho Full, Ortho         Neuro/Psych     Oriented x3: Yes   Mood/Affect: Normal         Dilation     Both eyes: 1.0% Mydriacyl, 2.5%  Phenylephrine @ 8:55 AM           Slit Lamp and Fundus Exam     Slit Lamp Exam       Right Left   Lids/Lashes Normal Normal   Conjunctiva/Sclera Nasal and temporal Pinguecula, Melanosis Nasal and Pinguecula, Melanosis   Cornea trace Punctate epithelial erosions trace Punctate epithelial erosions   Anterior Chamber Deep and quiet Deep and quiet   Iris Round and dilated, No NVI Round and dilated, No NVI   Lens 2+ Nuclear sclerosis, 2+ Cortical cataract 2+ Nuclear sclerosis, 2+ Cortical cataract   Vitreous mild Vitreous syneresis, Posterior vitreous detachment mild Vitreous syneresis         Fundus Exam       Right Left   Disc Pink and Sharp, temporal PPP Pink and Sharp, temporal PPP, no heme   C/D Ratio 0.5 0.5   Macula Superior BRVO with central CME -- improved; persistent clusters of DBH temporal macula (improving) -- focal exudates and persistent cystic changes ST mac -- slightly improved Flat, Good foveal reflex, RPE mottling and clumping, No heme or edema, geographic area of hypopigmented RPE nasal  and superior to fovea -- stable from prior   Vessels Vascular attenuation, Tortuous, AV crossing changes, ST arterioles with severe attenuation and sclerosis, superior BRVO/BRAO Vascular attenuation, Tortuous, AV crossing changes, peripheral sclerosis ST periphery   Periphery Attached, DBH temporal periphery -- extension of BRVO, schisis cavity IT periphery (from 0700-0900) - stable; focal pigmented CR scar at 0130 midzone, no RT/RD, faint CWS along ST arcades Attached, no heme, WWP temporal and inferior quadrants, shallow schisis IT periphery            IMAGING AND PROCEDURES  Imaging and Procedures for _0 @  OCT, Retina - OU - Both Eyes       Right Eye Quality was good. Central Foveal Thickness: 247. Progression has been stable. Findings include intraretinal hyper-reflective material, normal foveal contour, no SRF, intraretinal fluid (Mild interval improvement in cystic changes superior macula, greatest ST, stable schisis IT periphery caught on widefield).   Left Eye Quality was good. Central Foveal Thickness: 240. Progression has been stable. Findings include normal foveal contour, no IRF, no SRF (Partial PVD, Shallow IT Retinoschisis caught on widefield, stable improvement in focal peripapillary IRF nasal macula).   Notes *Images captured and stored on drive  Diagnosis / Impression:  OD: superior BRVO with CME, ?BRAO component -- Mild interval improvement in cystic changes superior macula, greatest ST OS: NFP, no IRF/SRF; Partial PVD, stable improvement in focal peripapillary IRF nasal macula  Clinical management:  See below  Abbreviations: NFP - Normal foveal profile. CME - cystoid macular edema. PED - pigment epithelial detachment. IRF - intraretinal fluid. SRF - subretinal fluid. EZ - ellipsoid zone. ERM - epiretinal membrane. ORA - outer retinal atrophy. ORT - outer retinal tubulation. SRHM - subretinal hyper-reflective material      Intravitreal Injection,  Pharmacologic Agent - OD - Right Eye       Time Out 12/14/2020. 9:11 AM. Confirmed correct patient, procedure, site, and patient consented.   Anesthesia Topical anesthesia was used. Anesthetic medications included Lidocaine 2%, Proparacaine 0.5%.   Procedure Preparation included 5% betadine to ocular surface, eyelid speculum. A (32g) needle was used.   Injection: 2 mg aflibercept 2 MG/0.05ML   Route: Intravitreal, Site: Right Eye   NDC: [id number], Lot: [id number], Expiration date: 08/25/2021, Waste: 0.05 mL   Post-op Post injection exam found visual acuity of at least  counting fingers, no retinal detachment, perfused optic nerve. The patient tolerated the procedure well. There were no complications. The patient received written and verbal post procedure care education.            ASSESSMENT/PLAN:    ICD-10-CM   1. Branch retinal vein occlusion of right eye with macular edema  H34.8310 Bevacizumab (AVASTIN) SOLN 1.25 mg    Intravitreal Injection, Pharmacologic Agent - OD - Right Eye    aflibercept (EYLEA) SOLN 2 mg    CANCELED: Intravitreal Injection, Pharmacologic Agent - OD - Right Eye    2. Retinal artery branch occlusion of right eye  H34.231     3. Retinal edema  H35.81 OCT, Retina - OU - Both Eyes    4. Essential hypertension  I10     5. Hypertensive retinopathy of both eyes  H35.033     6. 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 BRVO with CME OD  - delayed f/u -- 9 wks instead of 8  - s/p IVA OD #1 (10.05.20), #2 (11.02.20), #3 (11.30.20), #4 (01.07.21), #5 (02.05.21), #6 (03.05.21), #7 (04.02.21), #8 (05.04.21), #9 (06.03.21), #10 (07.22.21), #11 (08.19.21), #12 (09.16.21) -- IVA resistance  - s/p IVE OD #1 (10.14.21), #2 (11.11.21), #3 (12.09.21), #4 (01.13.22), #5 (2.10.22), #6 (04.05.22), #7 (05.26.22)  - initial exam and OCT findings suggestive of a BRAO component contributing  -  BCVA stable OD -- 20/20  - exam shows persistent IRH/DBH and exudates improving  - OCT shows mild interval improvement in cystic changes superior macula, greatest ST at 9 weeks   - recommend IVE OD #8 today, 07.21.22 for BRVO w/ CME with extension to 10 weeks  - pt wishes to proceed  - RBA of procedure discussed, questions answered  - informed consent obtained  - see procedure note  - Avastin informed consent obtain and scanned on 01.07.21  - Eylea4U benefits investigation started 9.16.21 -- approved for 2022 (Good Days Copay card)  - F/U 10 weeks -- DFE/OCT/possible injection, widefield OCT through schisis **ADDENDUM on 09.29.22**  - an order for intravitreal injection of Avastin (bevacizumab) was originally placed and signed on 07.21.22 -- erroneously  - a second order for the correct medication injected (Eylea) placed today, 9.29.22 and the original order has been deleted  4,5. Hypertensive retinopathy OU  - pt not formally diagnosed with HTN  - discussed importance of tight BP control and its relation to problems #1-3 above  - advised discussion with PCP and possible calibration of home BP cuff with PCP's  - monitor   6. 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  - discussed diagnosis and progression  - not yet visually significant  - monitor for now  7. Retinoschisis OU  - shallow schisis inferotemporal periphery -- stable  - stable on widefield OCT  - no RT/RD on scleral depression  - pt asymptomatic  - monitor    Ophthalmic Meds Ordered this visit:  Meds ordered this encounter  Medications   Bevacizumab (AVASTIN) SOLN 1.25 mg   aflibercept (EYLEA) SOLN 2 mg      Return in about 10 weeks (around 02/22/2021) for f/u BRVO OD, DFE, OCT.  There are no Patient Instructions on file for this visit.   Explained the diagnoses, plan, and follow up with the  patient and they  expressed understanding.  Patient expressed understanding of the importance of proper follow up care.   This document serves as a record of services personally performed by Gardiner Sleeper, MD, PhD. It was created on their behalf by Leonie Douglas, an ophthalmic technician. The creation of this record is the provider's dictation and/or activities during the visit.    Electronically signed by: Leonie Douglas COA, 02/22/21  12:41 PM  This document serves as a record of services personally performed by Gardiner Sleeper, MD, PhD. It was created on their behalf by San Jetty. Owens Shark, OA an ophthalmic technician. The creation of this record is the provider's dictation and/or activities during the visit.    Electronically signed by: San Jetty. Owens Shark, [place] 07.21.2022 12:41 PM  Gardiner Sleeper, M.D., Ph.D. Diseases & Surgery of the Retina and [place] 12/14/2020   I have reviewed the above documentation for accuracy and completeness, and I agree with the above. Gardiner Sleeper, M.D., Ph.D. 02/22/21 12:41 PM   Abbreviations: M myopia (nearsighted); A astigmatism; H hyperopia (farsighted); P presbyopia; Mrx spectacle prescription;  CTL contact lenses; OD right eye; OS left eye; OU both eyes  XT exotropia; ET esotropia; PEK punctate epithelial keratitis; PEE punctate epithelial erosions; DES dry eye syndrome; MGD meibomian gland dysfunction; ATs artificial tears; PFAT's preservative free artificial tears; [place] nuclear sclerotic cataract; PSC posterior subcapsular cataract; ERM epi-retinal membrane; PVD posterior vitreous detachment; RD retinal detachment; DM diabetes mellitus; DR diabetic retinopathy; NPDR non-proliferative diabetic retinopathy; PDR proliferative diabetic retinopathy; CSME clinically significant macular edema; DME diabetic macular edema; dbh dot blot hemorrhages; CWS cotton wool spot; POAG primary open angle glaucoma; C/D cup-to-disc ratio; HVF humphrey visual field;  GVF goldmann visual field; OCT optical coherence tomography; IOP intraocular pressure; BRVO Branch retinal vein occlusion; CRVO central retinal vein occlusion; CRAO central retinal artery occlusion; BRAO branch retinal artery occlusion; RT retinal tear; SB scleral buckle; PPV pars plana vitrectomy; VH Vitreous hemorrhage; PRP panretinal laser photocoagulation; IVK intravitreal kenalog; VMT vitreomacular traction; MH Macular hole;  NVD neovascularization of the disc; NVE neovascularization elsewhere; AREDS age related eye disease study; ARMD age related macular degeneration; POAG primary open angle glaucoma; EBMD epithelial/anterior basement membrane dystrophy; ACIOL anterior chamber intraocular lens; IOL intraocular lens; PCIOL posterior chamber intraocular lens; Phaco/IOL phacoemulsification with intraocular lens placement; [place] photorefractive keratectomy; LASIK laser assisted in situ keratomileusis; HTN hypertension; DM diabetes mellitus; COPD chronic obstructive pulmonary disease

## 2020-12-14 ENCOUNTER — Encounter (INDEPENDENT_AMBULATORY_CARE_PROVIDER_SITE_OTHER): Payer: Self-pay | Admitting: Ophthalmology

## 2020-12-14 ENCOUNTER — Other Ambulatory Visit: Payer: Self-pay

## 2020-12-14 ENCOUNTER — Ambulatory Visit (INDEPENDENT_AMBULATORY_CARE_PROVIDER_SITE_OTHER): Payer: 59 | Admitting: Ophthalmology

## 2020-12-14 DIAGNOSIS — H3581 Retinal edema: Secondary | ICD-10-CM | POA: Diagnosis not present

## 2020-12-14 DIAGNOSIS — I10 Essential (primary) hypertension: Secondary | ICD-10-CM | POA: Diagnosis not present

## 2020-12-14 DIAGNOSIS — H35033 Hypertensive retinopathy, bilateral: Secondary | ICD-10-CM

## 2020-12-14 DIAGNOSIS — H33103 Unspecified retinoschisis, bilateral: Secondary | ICD-10-CM

## 2020-12-14 DIAGNOSIS — H34231 Retinal artery branch occlusion, right eye: Secondary | ICD-10-CM | POA: Diagnosis not present

## 2020-12-14 DIAGNOSIS — H25813 Combined forms of age-related cataract, bilateral: Secondary | ICD-10-CM

## 2020-12-14 DIAGNOSIS — H34831 Tributary (branch) retinal vein occlusion, right eye, with macular edema: Secondary | ICD-10-CM

## 2020-12-14 MED ORDER — BEVACIZUMAB CHEMO INJECTION 1.25MG/0.05ML SYRINGE FOR KALEIDOSCOPE
1.2500 mg | INTRAVITREAL | Status: AC | PRN
  Administered 2020-12-14: 1.25 mg via INTRAVITREAL

## 2021-02-19 NOTE — Progress Notes (Signed)
[facility] Note  02/22/2021     CHIEF COMPLAINT Patient presents for Retina Follow Up   HISTORY OF PRESENT ILLNESS: Jacqueline Greer is a 60 y.o. female who presents to the clinic today for:   HPI     Retina Follow Up   Patient presents with  CRVO/BRVO.  In right eye.  This started 10 weeks ago.  I, the attending physician,  performed the HPI with the patient and updated documentation appropriately.        Comments   Patient here for 10 weeks retina follow up for BRVO w/ CME OD. Patient states vision doing good. No eye pain.       Last edited by Bernarda Caffey, MD on 02/22/2021 12:43 PM.      Referring physician: Julian Hy, PA-C [place] ,  [place] [address]  HISTORICAL INFORMATION:   Selected notes from the MEDICAL RECORD NUMBER Referred by Dr. Parke Simmers for concern of BRVO   CURRENT MEDICATIONS: No current outpatient medications on file. (Ophthalmic Drugs)   No current facility-administered medications for this visit. (Ophthalmic Drugs)   Current Outpatient Medications (Other)  Medication Sig   omeprazole (PRILOSEC) 20 MG capsule Take 20 mg by mouth daily.   No current facility-administered medications for this visit. (Other)   REVIEW OF SYSTEMS: ROS   Positive for: Cardiovascular, Eyes Negative for: Constitutional, Gastrointestinal, Neurological, Skin, Genitourinary, Musculoskeletal, HENT, Endocrine, Respiratory, Psychiatric, Allergic/Imm, Heme/Lymph Last edited by Theodore Demark, COA on 02/22/2021  8:26 AM.     ALLERGIES No Known Allergies  PAST MEDICAL HISTORY Past Medical History:  Diagnosis Date   Cataract    OU   Hypertensive retinopathy    OU   Past Surgical History:  Procedure Laterality Date   LAPAROSCOPIC TUBAL LIGATION  2003    FAMILY HISTORY Family History  Problem Relation Age of Onset   Diabetes Mother    Kidney failure Father     SOCIAL HISTORY Social History    Tobacco Use   Smoking status: Never   Smokeless tobacco: Never  Vaping Use   Vaping Use: Never used  Substance Use Topics   Alcohol use: No   Drug use: No       OPHTHALMIC EXAM:  Base Eye Exam     Visual Acuity (Snellen - Linear)       Right Left   Dist cc 20/25 20/25 -1   Dist ph cc 20/20 -2 20/20    Correction: Glasses         Tonometry (Tonopen, 8:23 AM)       Right Left   Pressure 15 19         Pupils       Dark Light Shape React APD   Right 4 2 Round Brisk None   Left 4 2 Round Brisk None         Visual Fields (Counting fingers)       Left Right    Full Full         Extraocular Movement       Right Left    Full, Ortho Full, Ortho         Neuro/Psych     Oriented x3: Yes   Mood/Affect: Normal         Dilation     Both eyes: 1.0% Mydriacyl, 2.5% Phenylephrine @ 8:23 AM           Slit Lamp and Fundus Exam  Slit Lamp Exam       Right Left   Lids/Lashes Normal Normal   Conjunctiva/Sclera Nasal and temporal Pinguecula, Melanosis Nasal and Pinguecula, Melanosis   Cornea trace Punctate epithelial erosions trace Punctate epithelial erosions   Anterior Chamber Deep and quiet Deep and quiet   Iris Round and dilated, No NVI Round and dilated, No NVI   Lens 2+ Nuclear sclerosis, 2+ Cortical cataract 2+ Nuclear sclerosis, 2+ Cortical cataract   Vitreous mild Vitreous syneresis, Posterior vitreous detachment mild Vitreous syneresis         Fundus Exam       Right Left   Disc Pink and Sharp, temporal PPP Pink and Sharp, temporal PPP, no heme   C/D Ratio 0.5 0.5   Macula Good foveal reflex, superior MA/DBH/exudates, mild interval increase in cystic changes ST mac Flat, Good foveal reflex, RPE mottling and clumping, No heme or edema, geographic area of hypopigmented RPE nasal and superior to fovea -- stable from prior   Vessels Vascular attenuation, Tortuous, AV crossing changes, ST arterioles with severe attenuation and  peripheral sclerosis, superior BRVO/BRAO Vascular attenuation, Tortuous, AV crossing changes, peripheral sclerosis ST periphery   Periphery Attached, DBH temporal periphery -- extension of BRVO, schisis cavity IT periphery (from 0700-0900) - stable; focal pigmented CR scar at 0130 midzone, no RT/RD Attached, no heme, WWP temporal and inferior quadrants, shallow schisis IT periphery           Refraction     Wearing Rx       Sphere Cylinder Axis Add   Right -4.50 +1.00 167 +2.25   Left -4.00 +0.50 015 +2.25            IMAGING AND PROCEDURES  Imaging and Procedures for _0 @  OCT, Retina - OU - Both Eyes       Right Eye Quality was good. Central Foveal Thickness: 248. Progression has worsened. Findings include intraretinal hyper-reflective material, normal foveal contour, no SRF, intraretinal fluid (Mild interval increase in temporal IRF, stable schisis IT periphery caught on widefield).   Left Eye Quality was good. Central Foveal Thickness: 235. Progression has been stable. Findings include normal foveal contour, no IRF, no SRF (Partial PVD, Shallow IT Retinoschisis caught on widefield, stable improvement in focal peripapillary IRF nasal macula).   Notes *Images captured and stored on drive  Diagnosis / Impression:  OD: superior BRVO with CME, ?BRAO component -- Mild interval increase in temporal IRF, greatest ST OS: NFP, no IRF/SRF; Partial PVD, stable improvement in focal peripapillary IRF nasal macula  Clinical management:  See below  Abbreviations: NFP - Normal foveal profile. CME - cystoid macular edema. PED - pigment epithelial detachment. IRF - intraretinal fluid. SRF - subretinal fluid. EZ - ellipsoid zone. ERM - epiretinal membrane. ORA - outer retinal atrophy. ORT - outer retinal tubulation. SRHM - subretinal hyper-reflective material      Intravitreal Injection, Pharmacologic Agent - OD - Right Eye       Time Out 02/22/2021. 9:17 AM. Confirmed correct  patient, procedure, site, and patient consented.   Anesthesia Topical anesthesia was used. Anesthetic medications included Lidocaine 2%, Proparacaine 0.5%.   Procedure Preparation included 5% betadine to ocular surface, eyelid speculum. A (32g) needle was used.   Injection: 2 mg aflibercept 2 MG/0.05ML   Route: Intravitreal, Site: Right Eye   NDC: [id number], Lot: [id number], Expiration date: 01/23/2022, Waste: 0.05 mL   Post-op Post injection exam found visual acuity of at least counting fingers. The patient tolerated the  procedure well. There were no complications. The patient received written and verbal post procedure care education. Post injection medications were not given.             ASSESSMENT/PLAN:    ICD-10-CM   1. Branch retinal vein occlusion of right eye with macular edema  H34.8310 Intravitreal Injection, Pharmacologic Agent - OD - Right Eye    aflibercept (EYLEA) SOLN 2 mg    2. Retinal artery branch occlusion of right eye  H34.231     3. Retinal edema  H35.81 OCT, Retina - OU - Both Eyes    4. Essential hypertension  I10     5. Hypertensive retinopathy of both eyes  H35.033     6. 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 Right retinoschisis  H33.101      1-3. BRVO with CME OD  - s/p IVA OD #1 (10.05.20), #2 (11.02.20), #3 (11.30.20), #4 (01.07.21), #5 (02.05.21), #6 (03.05.21), #7 (04.02.21), #8 (05.04.21), #9 (06.03.21), #10 (07.22.21), #11 (08.19.21), #12 (09.16.21) -- IVA resistance  - s/p IVE OD #1 (10.14.21), #2 (11.11.21), #3 (12.09.21), #4 (01.13.22), #5 (2.10.22), #6 (04.05.22), #7 (05.26.22), #8 (07.21.22)  - initial exam and OCT findings suggestive of a BRAO component contributing  - BCVA stable OD -- 20/20  - exam shows persistent IRH/DBH and exudates improving  - OCT shows mild interval increase in IRF superior macula at 10 weeks   - recommend IVE OD #9 today, 09.29.22 for  BRVO w/ CME with follow up back to 9 weeks  - pt wishes to proceed  - RBA of procedure discussed, questions answered  - informed consent obtained  - see procedure note  - Avastin informed consent obtain and scanned on 01.07.21  - Eylea4U benefits investigation started 9.16.21 -- approved for 2022 (Good Days Copay card)  - F/U 9 weeks -- DFE/OCT/possible injection, widefield OCT through schisis  4,5. Hypertensive retinopathy OU  - pt not formally diagnosed with HTN  - discussed importance of tight BP control and its relation to problems #1-3 above  - advised discussion with PCP and possible calibration of home BP cuff with PCP's  - monitor   6. 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  - discussed diagnosis and progression  - not yet visually significant  - monitor for now  7. Retinoschisis OU  - shallow schisis inferotemporal periphery -- stable  - stable on widefield OCT  - no RT/RD on scleral depression  - pt asymptomatic  - monitor    Ophthalmic Meds Ordered this visit:  Meds ordered this encounter  Medications   aflibercept (EYLEA) SOLN 2 mg       Return in about 9 weeks (around 04/26/2021) for f/u BRVO OD, DFE, OCT.  There are no Patient Instructions on file for this visit.   Explained the diagnoses, plan, and follow up with the patient and they expressed understanding.  Patient expressed understanding of the importance of proper follow up care.   This document serves as a record of services personally performed by Gardiner Sleeper, MD, PhD. It was created on their behalf by San Jetty. Owens Shark, OA an ophthalmic technician. The creation of this record is the provider's dictation and/or activities during the visit.    Electronically signed by: San Jetty. Owens Shark, [place] 09.26.2022 12:50 PM  Gardiner Sleeper, M.D., Ph.D. Diseases & Surgery of the Retina and Vitreous [facility]  I have reviewed the above documentation for accuracy and completeness, and I agree with the above. Gardiner Sleeper, M.D., Ph.D. 02/22/21 12:53 PM   Abbreviations: M myopia (nearsighted); A astigmatism; H hyperopia (farsighted); P presbyopia; Mrx spectacle prescription;  CTL contact lenses; OD right eye; OS left eye; OU both eyes  XT exotropia; ET esotropia; PEK punctate epithelial keratitis; PEE punctate epithelial erosions; DES dry eye syndrome; MGD meibomian gland dysfunction; ATs artificial tears; PFAT's preservative free artificial tears; [place] nuclear sclerotic cataract; PSC posterior subcapsular cataract; ERM epi-retinal membrane; PVD posterior vitreous detachment; RD retinal detachment; DM diabetes mellitus; DR diabetic retinopathy; NPDR non-proliferative diabetic retinopathy; PDR proliferative diabetic retinopathy; CSME clinically significant macular edema; DME diabetic macular edema; dbh dot blot hemorrhages; CWS cotton wool spot; POAG primary open angle glaucoma; C/D cup-to-disc ratio; HVF humphrey visual field; GVF goldmann visual field; OCT optical coherence tomography; IOP intraocular pressure; BRVO Branch retinal vein occlusion; CRVO central retinal vein occlusion; CRAO central retinal artery occlusion; BRAO branch retinal artery occlusion; RT retinal tear; SB scleral buckle; PPV pars plana vitrectomy; VH Vitreous hemorrhage; PRP panretinal laser photocoagulation; IVK intravitreal kenalog; VMT vitreomacular traction; MH Macular hole;  NVD neovascularization of the disc; NVE neovascularization elsewhere; AREDS age related eye disease study; ARMD age related macular degeneration; POAG primary open angle glaucoma; EBMD epithelial/anterior basement membrane dystrophy; ACIOL anterior chamber intraocular lens; IOL intraocular lens; PCIOL posterior chamber intraocular lens; Phaco/IOL phacoemulsification with intraocular lens placement; [place] photorefractive keratectomy; LASIK laser  assisted in situ keratomileusis; HTN hypertension; DM diabetes mellitus; COPD chronic obstructive pulmonary disease

## 2021-02-22 ENCOUNTER — Encounter (INDEPENDENT_AMBULATORY_CARE_PROVIDER_SITE_OTHER): Payer: Self-pay | Admitting: Ophthalmology

## 2021-02-22 ENCOUNTER — Ambulatory Visit (INDEPENDENT_AMBULATORY_CARE_PROVIDER_SITE_OTHER): Payer: 59 | Admitting: Ophthalmology

## 2021-02-22 ENCOUNTER — Other Ambulatory Visit: Payer: Self-pay

## 2021-02-22 DIAGNOSIS — I10 Essential (primary) hypertension: Secondary | ICD-10-CM

## 2021-02-22 DIAGNOSIS — H3581 Retinal edema: Secondary | ICD-10-CM

## 2021-02-22 DIAGNOSIS — H34831 Tributary (branch) retinal vein occlusion, right eye, with macular edema: Secondary | ICD-10-CM | POA: Diagnosis not present

## 2021-02-22 DIAGNOSIS — H25813 Combined forms of age-related cataract, bilateral: Secondary | ICD-10-CM

## 2021-02-22 DIAGNOSIS — H33101 Unspecified retinoschisis, right eye: Secondary | ICD-10-CM

## 2021-02-22 DIAGNOSIS — H34231 Retinal artery branch occlusion, right eye: Secondary | ICD-10-CM

## 2021-02-22 DIAGNOSIS — H35033 Hypertensive retinopathy, bilateral: Secondary | ICD-10-CM | POA: Diagnosis not present

## 2021-02-22 DIAGNOSIS — H33103 Unspecified retinoschisis, bilateral: Secondary | ICD-10-CM

## 2021-02-22 MED ORDER — AFLIBERCEPT 2MG/0.05ML IZ SOLN FOR KALEIDOSCOPE
2.0000 mg | INTRAVITREAL | Status: AC | PRN
  Administered 2021-02-22: 2 mg via INTRAVITREAL

## 2021-02-22 NOTE — Addendum Note (Signed)
Addended by: Karie Chimera on: 02/22/2021 12:43 PM   Modules accepted: Orders

## 2021-04-23 NOTE — Progress Notes (Signed)
[facility] Note  04/26/2021     CHIEF COMPLAINT Patient presents for Retina Follow Up   HISTORY OF PRESENT ILLNESS: Jacqueline Greer is a 60 y.o. female who presents to the clinic today for:   HPI     Retina Follow Up   Patient presents with  CRVO/BRVO.  In right eye.  Duration of 10 weeks.  Since onset it is stable.  I, the attending physician,  performed the HPI with the patient and updated documentation appropriately.        Comments   10 week follow up BRVO OD-  No changes in vision since last visit.        Last edited by Bernarda Caffey, MD on 04/26/2021  9:39 AM.     Referring physician: Julian Hy, PA-C [place] ,  [place] [address]  HISTORICAL INFORMATION:   Selected notes from the MEDICAL RECORD NUMBER Referred by Dr. Parke Simmers for concern of BRVO   CURRENT MEDICATIONS: No current outpatient medications on file. (Ophthalmic Drugs)   No current facility-administered medications for this visit. (Ophthalmic Drugs)   Current Outpatient Medications (Other)  Medication Sig   omeprazole (PRILOSEC) 20 MG capsule Take 20 mg by mouth daily.   No current facility-administered medications for this visit. (Other)   REVIEW OF SYSTEMS: ROS   Positive for: Cardiovascular, Eyes Negative for: Constitutional, Gastrointestinal, Neurological, Skin, Genitourinary, Musculoskeletal, HENT, Endocrine, Respiratory, Psychiatric, Allergic/Imm, Heme/Lymph Last edited by Leonie Douglas, COA on 04/26/2021  8:36 AM.      ALLERGIES No Known Allergies  PAST MEDICAL HISTORY Past Medical History:  Diagnosis Date   Cataract    OU   Hypertensive retinopathy    OU   Past Surgical History:  Procedure Laterality Date   LAPAROSCOPIC TUBAL LIGATION  2003    FAMILY HISTORY Family History  Problem Relation Age of Onset   Diabetes Mother    Kidney failure Father     SOCIAL HISTORY Social History   Tobacco Use   Smoking  status: Never   Smokeless tobacco: Never  Vaping Use   Vaping Use: Never used  Substance Use Topics   Alcohol use: No   Drug use: No       OPHTHALMIC EXAM:  Base Eye Exam     Visual Acuity (Snellen - Linear)       Right Left   Dist cc 20/30 -1 20/25   Dist ph cc 20/20 -2 20/20    Correction: Glasses         Tonometry (Tonopen, 8:42 AM)       Right Left   Pressure 15 17         Pupils       Dark Light Shape React APD   Right 4 3 Round Brisk None   Left 4 3 Round Brisk None         Visual Fields (Counting fingers)       Left Right    Full Full         Extraocular Movement       Right Left    Full Full         Neuro/Psych     Oriented x3: Yes   Mood/Affect: Normal         Dilation     Both eyes: 1.0% Mydriacyl, 2.5% Phenylephrine @ 8:42 AM           Slit Lamp and Fundus Exam  Slit Lamp Exam       Right Left   Lids/Lashes Normal Normal   Conjunctiva/Sclera Nasal and temporal Pinguecula, Melanosis Nasal and Pinguecula, Melanosis   Cornea trace Punctate epithelial erosions trace Punctate epithelial erosions   Anterior Chamber Deep and quiet Deep and quiet   Iris Round and dilated, No NVI Round and dilated, No NVI   Lens 2+ Nuclear sclerosis, 2+ Cortical cataract 2+ Nuclear sclerosis, 2+ Cortical cataract   Anterior Vitreous mild Vitreous syneresis, Posterior vitreous detachment mild Vitreous syneresis         Fundus Exam       Right Left   Disc Pink and Sharp, temporal PPP Pink and Sharp, temporal PPP, no heme   C/D Ratio 0.5 0.5   Macula Good foveal reflex, superior MA/DBH/exudates, mild interval improvement in cystic changes ST mac, prominent cluster of exudates temporal macula Flat, Good foveal reflex, RPE mottling and clumping, No heme or edema, geographic area of hypopigmented RPE nasal and superior to fovea -- stable from prior   Vessels Vascular attenuation, Tortuous, AV crossing changes, ST arterioles with severe  attenuation and peripheral sclerosis, superior BRVO/BRAO Vascular attenuation, Tortuous, AV crossing changes, peripheral sclerosis ST periphery   Periphery Attached, DBH temporal periphery -- extension of BRVO, bullous schisis cavity IT periphery (from 0700-0900) - stable; focal pigmented CR scar at 0130 midzone, no RT/RD Attached, no heme, WWP temporal and inferior quadrants, shallow schisis IT periphery           Refraction     Wearing Rx       Sphere Cylinder Axis Add   Right -4.50 +1.00 167 +2.25   Left -4.00 +0.50 015 +2.25            IMAGING AND PROCEDURES  Imaging and Procedures for _0 @  OCT, Retina - OU - Both Eyes       Right Eye Quality was good. Central Foveal Thickness: 241. Progression has improved. Findings include intraretinal hyper-reflective material, normal foveal contour, no SRF, intraretinal fluid (Mild interval improvement in temporal IRF/IRHM, stable schisis IT periphery -- not imaged today, partial PVD).   Left Eye Quality was good. Central Foveal Thickness: 236. Progression has been stable. Findings include normal foveal contour, no IRF, no SRF (Partial PVD, Shallow IT Retinoschisis -- not imaged today).   Notes *Images captured and stored on drive  Diagnosis / Impression:  OD: superior BRVO with CME, ?BRAO component -- Mild interval improvement in temporal IRF/IRHM OS: NFP, no IRF/SRF; Partial PVD  Clinical management:  See below  Abbreviations: NFP - Normal foveal profile. CME - cystoid macular edema. PED - pigment epithelial detachment. IRF - intraretinal fluid. SRF - subretinal fluid. EZ - ellipsoid zone. ERM - epiretinal membrane. ORA - outer retinal atrophy. ORT - outer retinal tubulation. SRHM - subretinal hyper-reflective material      Intravitreal Injection, Pharmacologic Agent - OD - Right Eye       Time Out 04/26/2021. 9:06 AM. Confirmed correct patient, procedure, site, and patient consented.   Anesthesia Topical  anesthesia was used. Anesthetic medications included Lidocaine 2%, Proparacaine 0.5%.   Procedure Preparation included 5% betadine to ocular surface, eyelid speculum. A (32g) needle was used.   Injection: 2 mg aflibercept 2 MG/0.05ML   Route: Intravitreal, Site: Right Eye   NDC: [id number], Lot: [id number], Expiration date: 03/26/2022, Waste: 0.05 mL   Post-op Post injection exam found visual acuity of at least counting fingers. The patient tolerated the procedure well. There were no complications.  The patient received written and verbal post procedure care education. Post injection medications were not given.              ASSESSMENT/PLAN:    ICD-10-CM   1. Branch retinal vein occlusion of right eye with macular edema  H34.8310 Intravitreal Injection, Pharmacologic Agent - OD - Right Eye    aflibercept (EYLEA) SOLN 2 mg    2. Retinal artery branch occlusion of right eye  H34.231     3. Retinal edema  H35.81 OCT, Retina - OU - Both Eyes    4. Essential hypertension  I10     5. Hypertensive retinopathy of both eyes  H35.033     6. 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 BRVO with CME OD  - s/p IVA OD #1 (10.05.20), #2 (11.02.20), #3 (11.30.20), #4 (01.07.21), #5 (02.05.21), #6 (03.05.21), #7 (04.02.21), #8 (05.04.21), #9 (06.03.21), #10 (07.22.21), #11 (08.19.21), #12 (09.16.21) -- IVA resistance  - s/p IVE OD #1 (10.14.21), #2 (11.11.21), #3 (12.09.21), #4 (01.13.22), #5 (2.10.22), #6 (04.05.22), #7 (05.26.22), #8 (07.21.22), #9 (09.29.22)  - **history of increased IRF at 10 wk interval, noted on 9.29.22 visit**  - initial exam and OCT findings suggestive of a BRAO component contributing  - BCVA stable OD -- 20/20  - exam shows IRH/DBH and exudates improving  - OCT shows Mild interval improvement in temporal IRF/IRHM at 9 weeks   - recommend IVE OD #10 today, 12.01.22 for BRVO w/ CME with follow up  in 9 weeks  - pt wishes to proceed  - RBA of procedure discussed, questions answered  - informed consent obtained  - see procedure note  - Avastin informed consent obtain and scanned on 01.07.21  - [employer] benefits investigation started 9.16.21 -- approved for 2022 (Good Days Copay card)  - F/U 9 weeks -- DFE/OCT/possible injection, widefield OCT through schisis  4,5. Hypertensive retinopathy OU  - pt not formally diagnosed with HTN  - discussed importance of tight BP control and its relation to problems #1-3 above  - advised discussion with PCP and possible calibration of home BP cuff with PCP's  - monitor   6. 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  - discussed diagnosis and progression  - not yet visually significant  - monitor for now  7. Retinoschisis OU  - shallow schisis inferotemporal periphery -- stable  - stable on widefield OCT  - no RT/RD on scleral depression  - pt asymptomatic  - monitor    Ophthalmic Meds Ordered this visit:  Meds ordered this encounter  Medications   aflibercept (EYLEA) SOLN 2 mg     Return in about 9 weeks (around 06/28/2021) for f/u BRVO OD, DFE, OCT.  There are no Patient Instructions on file for this visit.   Explained the diagnoses, plan, and follow up with the patient and they expressed understanding.  Patient expressed understanding of the importance of proper follow up care.   This document serves as a record of services personally performed by Gardiner Sleeper, MD, PhD. It was created on their behalf by San Jetty. Owens Shark, OA an ophthalmic technician. The creation of this record is the provider's dictation and/or activities during the visit.    Electronically signed by: San Jetty. Owens Shark, [place] 11.28.2022 9:44 AM  Sharyon Cable.  Coralyn Pear, M.D., Ph.D. Diseases & Surgery of the Retina and [place]  I have reviewed  the above documentation for accuracy and completeness, and I agree with the above. Gardiner Sleeper, M.D., Ph.D. 04/26/21 9:44 AM   Abbreviations: M myopia (nearsighted); A astigmatism; H hyperopia (farsighted); P presbyopia; Mrx spectacle prescription;  CTL contact lenses; OD right eye; OS left eye; OU both eyes  XT exotropia; ET esotropia; PEK punctate epithelial keratitis; PEE punctate epithelial erosions; DES dry eye syndrome; MGD meibomian gland dysfunction; ATs artificial tears; PFAT's preservative free artificial tears; [place] nuclear sclerotic cataract; PSC posterior subcapsular cataract; ERM epi-retinal membrane; PVD posterior vitreous detachment; RD retinal detachment; DM diabetes mellitus; DR diabetic retinopathy; NPDR non-proliferative diabetic retinopathy; PDR proliferative diabetic retinopathy; CSME clinically significant macular edema; DME diabetic macular edema; dbh dot blot hemorrhages; CWS cotton wool spot; POAG primary open angle glaucoma; C/D cup-to-disc ratio; HVF humphrey visual field; GVF goldmann visual field; OCT optical coherence tomography; IOP intraocular pressure; BRVO Branch retinal vein occlusion; CRVO central retinal vein occlusion; CRAO central retinal artery occlusion; BRAO branch retinal artery occlusion; RT retinal tear; SB scleral buckle; PPV pars plana vitrectomy; VH Vitreous hemorrhage; PRP panretinal laser photocoagulation; IVK intravitreal kenalog; VMT vitreomacular traction; MH Macular hole;  NVD neovascularization of the disc; NVE neovascularization elsewhere; AREDS age related eye disease study; ARMD age related macular degeneration; POAG primary open angle glaucoma; EBMD epithelial/anterior basement membrane dystrophy; ACIOL anterior chamber intraocular lens; IOL intraocular lens; PCIOL posterior chamber intraocular lens; Phaco/IOL phacoemulsification with intraocular lens placement; [place] photorefractive keratectomy; LASIK laser assisted in situ keratomileusis; HTN  hypertension; DM diabetes mellitus; COPD chronic obstructive pulmonary disease

## 2021-04-26 ENCOUNTER — Other Ambulatory Visit: Payer: Self-pay

## 2021-04-26 ENCOUNTER — Ambulatory Visit (INDEPENDENT_AMBULATORY_CARE_PROVIDER_SITE_OTHER): Payer: 59 | Admitting: Ophthalmology

## 2021-04-26 ENCOUNTER — Encounter (INDEPENDENT_AMBULATORY_CARE_PROVIDER_SITE_OTHER): Payer: Self-pay | Admitting: Ophthalmology

## 2021-04-26 DIAGNOSIS — H3581 Retinal edema: Secondary | ICD-10-CM

## 2021-04-26 DIAGNOSIS — I10 Essential (primary) hypertension: Secondary | ICD-10-CM | POA: Diagnosis not present

## 2021-04-26 DIAGNOSIS — H34831 Tributary (branch) retinal vein occlusion, right eye, with macular edema: Secondary | ICD-10-CM | POA: Diagnosis not present

## 2021-04-26 DIAGNOSIS — H35033 Hypertensive retinopathy, bilateral: Secondary | ICD-10-CM

## 2021-04-26 DIAGNOSIS — H34231 Retinal artery branch occlusion, right eye: Secondary | ICD-10-CM | POA: Diagnosis not present

## 2021-04-26 DIAGNOSIS — H33103 Unspecified retinoschisis, bilateral: Secondary | ICD-10-CM

## 2021-04-26 DIAGNOSIS — H25813 Combined forms of age-related cataract, bilateral: Secondary | ICD-10-CM

## 2021-04-26 MED ORDER — AFLIBERCEPT 2MG/0.05ML IZ SOLN FOR KALEIDOSCOPE
2.0000 mg | INTRAVITREAL | Status: AC | PRN
Start: 2021-04-26 — End: 2021-04-26
  Administered 2021-04-26: 2 mg via INTRAVITREAL

## 2021-06-25 NOTE — Progress Notes (Addendum)
[facility] Note  06/28/2021     CHIEF COMPLAINT Patient presents for Retina Follow Up    HISTORY OF PRESENT ILLNESS: Jacqueline Greer is a 61 y.o. female who presents to the clinic today for:   HPI     Retina Follow Up   Patient presents with  CRVO/BRVO.  In right eye.  This started years ago.  Severity is moderate.  Duration of 9 weeks.  Since onset it is stable.  I, the attending physician,  performed the HPI with the patient and updated documentation appropriately.        Comments   61 y/o female pt here for 9 wk f/u for BRVO w/CME OD.  No change in [place] OU noticed, but pt does feel like she may need new glasses.  Denies pain, FOL, floaters.  No gtts.      Last edited by Bernarda Caffey, MD on 06/28/2021  4:57 PM.    Pt feels like she needs new glasses, she says she has to move her glasses around to see clearly out of them  Referring physician: Julian Hy, PA-C [place] ,  [place] [address]  HISTORICAL INFORMATION:   Selected notes from the [facility] Referred by Dr. Parke Simmers for concern of BRVO   CURRENT MEDICATIONS: No current outpatient medications on file. (Ophthalmic Drugs)   No current facility-administered medications for this visit. (Ophthalmic Drugs)   Current Outpatient Medications (Other)  Medication Sig   omeprazole (PRILOSEC) 20 MG capsule Take 20 mg by mouth daily.   No current facility-administered medications for this visit. (Other)   REVIEW OF SYSTEMS: ROS   Positive for: Eyes Negative for: Constitutional, Gastrointestinal, Neurological, Skin, Genitourinary, Musculoskeletal, HENT, Endocrine, Cardiovascular, Respiratory, Psychiatric, Allergic/Imm, Heme/Lymph Last edited by Matthew Folks, COA on 06/28/2021  8:48 AM.      ALLERGIES No Known Allergies  PAST MEDICAL HISTORY Past Medical History:  Diagnosis Date   Cataract    OU   Hypertensive retinopathy    OU   Past Surgical  History:  Procedure Laterality Date   LAPAROSCOPIC TUBAL LIGATION  2003    FAMILY HISTORY Family History  Problem Relation Age of Onset   Diabetes Mother    Kidney failure Father     SOCIAL HISTORY Social History   Tobacco Use   Smoking status: Never   Smokeless tobacco: Never  Vaping Use   Vaping Use: Never used  Substance Use Topics   Alcohol use: No   Drug use: No       OPHTHALMIC EXAM:  Base Eye Exam     Visual Acuity (Snellen - Linear)       Right Left   Dist cc 20/30 -2 20/40 -2   Dist ph cc 20/20 -2 20/20 -2    Correction: Glasses         Tonometry (Tonopen, 8:50 AM)       Right Left   Pressure 17 15         Pupils       Dark Light Shape React APD   Right 4 3 Round Brisk None   Left 4 3 Round Brisk None         Visual Fields (Counting fingers)       Left Right    Full Full         Extraocular Movement       Right Left    Full, Ortho Full, Ortho  Neuro/Psych     Oriented x3: Yes   Mood/Affect: Normal         Dilation     Both eyes: 1.0% Mydriacyl, 2.5% Phenylephrine @ 8:50 AM           Slit Lamp and Fundus Exam     Slit Lamp Exam       Right Left   Lids/Lashes Normal Normal   Conjunctiva/Sclera Nasal and temporal Pinguecula, Melanosis Nasal and Pinguecula, Melanosis   Cornea trace Punctate epithelial erosions trace Punctate epithelial erosions   Anterior Chamber Deep and quiet Deep and quiet   Iris Round and dilated, No NVI Round and dilated, No NVI   Lens 2+ Nuclear sclerosis, 2+ Cortical cataract 2+ Nuclear sclerosis, 2+ Cortical cataract   Anterior Vitreous mild Vitreous syneresis, Posterior vitreous detachment mild Vitreous syneresis         Fundus Exam       Right Left   Disc Pink and Sharp, temporal PPP Pink and Sharp, temporal PPP, no heme   C/D Ratio 0.5 0.5   Macula Good foveal reflex, superior MA/DBH/exudates, mild interval improvement in cystic changes ST mac, prominent cluster of  exudates temporal and SN macula Flat, Good foveal reflex, RPE mottling and clumping, No heme or edema, geographic area of hypopigmented RPE nasal and superior to fovea -- stable from prior   Vessels Vascular attenuation, Tortuous, AV crossing changes, ST arterioles with severe attenuation and peripheral sclerosis, superior BRVO/BRAO Vascular attenuation, Tortuous, AV crossing changes, peripheral sclerosis ST periphery   Periphery Attached, DBH temporal periphery -- extension of BRVO, bullous schisis cavity IT periphery (from 0700-0900) - stable; focal pigmented CR scar at 0130 midzone, no RT/RD Attached, no heme, WWP temporal and inferior quadrants, shallow schisis IT periphery            IMAGING AND PROCEDURES  Imaging and Procedures for _0 @  OCT, Retina - OU - Both Eyes       Right Eye Quality was good. Central Foveal Thickness: 243. Progression has improved. Findings include intraretinal hyper-reflective material, normal foveal contour, no SRF, intraretinal fluid (Mild interval improvement in temporal IRF/IRHM, stable schisis IT periphery, partial PVD).   Left Eye Quality was good. Central Foveal Thickness: 235. Progression has been stable. Findings include normal foveal contour, no IRF, no SRF (Partial PVD, Shallow IT Retinoschisis ).   Notes *Images captured and stored on drive  Diagnosis / Impression:  OD: superior BRVO with CME, ?BRAO component -- Mild interval improvement in temporal IRF/IRHM, stable schisis IT periphery caught on widefield, partial PVD OS: NFP, no IRF/SRF; Partial PVD  Clinical management:  See below  Abbreviations: NFP - Normal foveal profile. CME - cystoid macular edema. PED - pigment epithelial detachment. IRF - intraretinal fluid. SRF - subretinal fluid. EZ - ellipsoid zone. ERM - epiretinal membrane. ORA - outer retinal atrophy. ORT - outer retinal tubulation. SRHM - subretinal hyper-reflective material      Intravitreal Injection, Pharmacologic  Agent - OD - Right Eye       Time Out 06/28/2021. 9:05 AM. Confirmed correct patient, procedure, site, and patient consented.   Anesthesia Topical anesthesia was used. Anesthetic medications included Lidocaine 2%, Proparacaine 0.5%.   Procedure Preparation included 5% betadine to ocular surface, eyelid speculum. A (32g) needle was used.   Injection: 2 mg aflibercept 2 MG/0.05ML   Route: Intravitreal, Site: Right Eye   NDC: [id number], Lot: [id number], Expiration date: 04/25/2022, Waste: 0.05 mL   Post-op Post injection exam found visual  acuity of at least counting fingers. The patient tolerated the procedure well. There were no complications. The patient received written and verbal post procedure care education. Post injection medications were not given.            ASSESSMENT/PLAN:    ICD-10-CM   1. 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 Retinal artery branch occlusion of right eye  H34.231     3. Essential hypertension  I10     4. Hypertensive retinopathy of both eyes  H35.033     5. 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 BRVO with CME OD  - s/p IVA OD #1 (10.05.20), #2 (11.02.20), #3 (11.30.20), #4 (01.07.21), #5 (02.05.21), #6 (03.05.21), #7 (04.02.21), #8 (05.04.21), #9 (06.03.21), #10 (07.22.21), #11 (08.19.21), #12 (09.16.21) -- IVA resistance  - s/p IVE OD #1 (10.14.21), #2 (11.11.21), #3 (12.09.21), #4 (01.13.22), #5 (2.10.22), #6 (04.05.22), #7 (05.26.22), #8 (07.21.22), #9 (09.29.22), #10 (12.01.22)  - **history of increased IRF at 10 wk interval, noted on 9.29.22 visit**  - initial exam and OCT findings suggestive of a BRAO component contributing  - BCVA stable OD -- 20/20  - exam shows IRH/DBH and exudates improving  - OCT  shows Mild interval improvement in temporal IRF/IRHM at 9 weeks   - recommend IVE OD #11 today, 02.02.23 for BRVO w/ CME with follow up in 9 weeks  - pt wishes to proceed  - RBA of procedure discussed, questions answered  - informed consent obtained  - see procedure note  - Avastin informed consent obtain and scanned on 01.07.21  - [employer] benefits investigation started 9.16.21 -- approved for 2022 (Good Days Copay card)  - F/U 9 weeks -- DFE/OCT/possible injection, widefield OCT through schisis  3,4. Hypertensive retinopathy OU  - pt not formally diagnosed with HTN  - discussed importance of tight BP control and its relation to problems #1-3 above  - advised discussion with PCP and possible calibration of home BP cuff with PCP's  - monitor   5. 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  - discussed diagnosis and progression  - not yet visually significant  - monitor for now  6. Retinoschisis OU  - shallow schisis inferotemporal periphery -- stable  - stable on widefield OCT  - no RT/RD on scleral depression  - pt asymptomatic  - monitor   Ophthalmic Meds Ordered this visit:  Meds ordered this encounter  Medications   aflibercept (EYLEA) SOLN 2 mg     Return in about 9 weeks (around 08/30/2021) for f/u BRVO OD, DFE, OCT.  There are no Patient Instructions on file for this visit.   Explained the diagnoses, plan, and follow up with the patient and they expressed understanding.  Patient expressed understanding of the importance of proper follow up care.   This document serves as a record of services personally performed by Gardiner Sleeper, MD, PhD. It was created on their behalf by San Jetty. Owens Shark, OA an ophthalmic technician. The creation of this record is the provider's dictation and/or activities during the visit.    Electronically signed by:  San Jetty. [place], [place] 01.30.2023 5:02  PM  Gardiner Sleeper, M.D., Ph.D. Diseases & Surgery of the Retina and Vitreous [facility]  I have reviewed the above documentation for accuracy and completeness, and I agree with the above. Gardiner Sleeper, M.D., Ph.D. 06/28/21 5:02 PM   Abbreviations: M myopia (nearsighted); A astigmatism; H hyperopia (farsighted); P presbyopia; Mrx spectacle prescription;  CTL contact lenses; OD right eye; OS left eye; OU both eyes  XT exotropia; ET esotropia; PEK punctate epithelial keratitis; PEE punctate epithelial erosions; DES dry eye syndrome; MGD meibomian gland dysfunction; ATs artificial tears; PFAT's preservative free artificial tears; [place] nuclear sclerotic cataract; PSC posterior subcapsular cataract; ERM epi-retinal membrane; PVD posterior vitreous detachment; RD retinal detachment; DM diabetes mellitus; DR diabetic retinopathy; NPDR non-proliferative diabetic retinopathy; PDR proliferative diabetic retinopathy; CSME clinically significant macular edema; DME diabetic macular edema; dbh dot blot hemorrhages; CWS cotton wool spot; POAG primary open angle glaucoma; C/D cup-to-disc ratio; HVF humphrey visual field; GVF goldmann visual field; OCT optical coherence tomography; IOP intraocular pressure; BRVO Branch retinal vein occlusion; CRVO central retinal vein occlusion; CRAO central retinal artery occlusion; BRAO branch retinal artery occlusion; RT retinal tear; SB scleral buckle; PPV pars plana vitrectomy; VH Vitreous hemorrhage; PRP panretinal laser photocoagulation; IVK intravitreal kenalog; VMT vitreomacular traction; MH Macular hole;  NVD neovascularization of the disc; NVE neovascularization elsewhere; AREDS age related eye disease study; ARMD age related macular degeneration; POAG primary open angle glaucoma; EBMD epithelial/anterior basement membrane dystrophy; ACIOL anterior chamber intraocular lens; IOL intraocular lens; PCIOL posterior chamber intraocular lens; Phaco/IOL  phacoemulsification with intraocular lens placement; [place] photorefractive keratectomy; LASIK laser assisted in situ keratomileusis; HTN hypertension; DM diabetes mellitus; COPD chronic obstructive pulmonary disease

## 2021-06-28 ENCOUNTER — Ambulatory Visit (INDEPENDENT_AMBULATORY_CARE_PROVIDER_SITE_OTHER): Payer: 59 | Admitting: Ophthalmology

## 2021-06-28 ENCOUNTER — Other Ambulatory Visit: Payer: Self-pay

## 2021-06-28 ENCOUNTER — Encounter (INDEPENDENT_AMBULATORY_CARE_PROVIDER_SITE_OTHER): Payer: Self-pay | Admitting: Ophthalmology

## 2021-06-28 DIAGNOSIS — H34231 Retinal artery branch occlusion, right eye: Secondary | ICD-10-CM

## 2021-06-28 DIAGNOSIS — H34831 Tributary (branch) retinal vein occlusion, right eye, with macular edema: Secondary | ICD-10-CM

## 2021-06-28 DIAGNOSIS — I10 Essential (primary) hypertension: Secondary | ICD-10-CM

## 2021-06-28 DIAGNOSIS — H25813 Combined forms of age-related cataract, bilateral: Secondary | ICD-10-CM

## 2021-06-28 DIAGNOSIS — H35033 Hypertensive retinopathy, bilateral: Secondary | ICD-10-CM

## 2021-06-28 DIAGNOSIS — H33103 Unspecified retinoschisis, bilateral: Secondary | ICD-10-CM

## 2021-06-28 MED ORDER — AFLIBERCEPT 2MG/0.05ML IZ SOLN FOR KALEIDOSCOPE
2.0000 mg | INTRAVITREAL | Status: AC | PRN
  Administered 2021-06-28: 2 mg via INTRAVITREAL

## 2021-08-27 NOTE — Progress Notes (Signed)
?[facility] Note ? ?08/30/2021 ? ?  ? ?CHIEF COMPLAINT ?Patient presents for Retina Follow Up ? ? ?HISTORY OF PRESENT ILLNESS: ?Jacqueline Greer is a 61 y.o. female who presents to the clinic today for:  ? ?HPI   ? ? Retina Follow Up   ?Patient presents with  CRVO/BRVO.  In right eye.  Severity is moderate.  Duration of 9 weeks.  Since onset it is stable.  I, the attending physician,  performed the HPI with the patient and updated documentation appropriately. ? ?  ?  ? ? Comments   ?Pt here for 9 wk ret f/u for BRVO OD. Pt states VA about the same, still needing updated rx specs.  ? ?  ?  ?Last edited by Bernarda Caffey, MD on 08/30/2021 11:00 PM.  ?  ? ?Pt feels like she needs new glasses, she says she has to move her glasses around to see clearly out of them ? ?Referring physician: ?Julian Hy, PA-C ?[address] ?[place] ,  [place] [address] ? ?HISTORICAL INFORMATION:  ? ?Selected notes from the [place] ?Referred by Dr. Parke Simmers for concern of BRVO  ? ?CURRENT MEDICATIONS: ?No current outpatient medications on file. (Ophthalmic Drugs)  ? ?No current facility-administered medications for this visit. (Ophthalmic Drugs)  ? ?Current Outpatient Medications (Other)  ?Medication Sig  ? omeprazole (PRILOSEC) 20 MG capsule Take 20 mg by mouth daily.  ? ?No current facility-administered medications for this visit. (Other)  ? ?REVIEW OF SYSTEMS: ?ROS   ?Positive for: Eyes ?Negative for: Constitutional, Gastrointestinal, Neurological, Skin, Genitourinary, Musculoskeletal, HENT, Endocrine, Cardiovascular, Respiratory, Psychiatric, Allergic/Imm, Heme/Lymph ?Last edited by Kingsley Spittle, COT on 08/30/2021  8:27 AM.  ?  ? ? ?ALLERGIES ?No Known Allergies ? ?PAST MEDICAL HISTORY ?Past Medical History:  ?Diagnosis Date  ? Cataract   ? OU  ? Hypertensive retinopathy   ? OU  ? ?Past Surgical History:  ?Procedure Laterality Date  ? LAPAROSCOPIC TUBAL LIGATION  2003  ? ? ?FAMILY  HISTORY ?Family History  ?Problem Relation Age of Onset  ? Diabetes Mother   ? Kidney failure Father   ? ? ?SOCIAL HISTORY ?Social History  ? ?Tobacco Use  ? Smoking status: Never  ? Smokeless tobacco: Never  ?Vaping Use  ? Vaping Use: Never used  ?Substance Use Topics  ? Alcohol use: No  ? Drug use: No  ?  ? ?  ?OPHTHALMIC EXAM: ? ?Base Eye Exam   ? ? Visual Acuity (Snellen - Linear)   ? ?   Right Left  ? Dist cc 20/40 -1 20/40 -1  ? Dist ph cc 20/25 -2 20/20  ? ? Correction: Glasses  ? ?  ?  ? ? Tonometry (Tonopen, 8:35 AM)   ? ?   Right Left  ? Pressure 15 17  ? ?  ?  ? ? Pupils   ? ?   Dark Light Shape React APD  ? Right 4 3 Round Brisk None  ? Left 4 3 Round Brisk None  ? ?  ?  ? ? Visual Fields (Counting fingers)   ? ?   Left Right  ?  Full Full  ? ?  ?  ? ? Extraocular Movement   ? ?   Right Left  ?  Full, Ortho Full, Ortho  ? ?  ?  ? ? Neuro/Psych   ? ? Oriented x3: Yes  ? Mood/Affect: Normal  ? ?  ?  ? ?  Dilation   ? ? Both eyes: 1.0% Mydriacyl, 2.5% Phenylephrine @ 8:35 AM  ? ?  ?  ? ?  ? ?Slit Lamp and Fundus Exam   ? ? Slit Lamp Exam   ? ?   Right Left  ? Lids/Lashes Normal Normal  ? Conjunctiva/Sclera Nasal and temporal Pinguecula, Melanosis Nasal and Pinguecula, Melanosis  ? Cornea trace Punctate epithelial erosions trace Punctate epithelial erosions  ? Anterior Chamber Deep and quiet Deep and quiet  ? Iris Round and dilated, No NVI Round and dilated, No NVI  ? Lens 2+ Nuclear sclerosis, 2+ Cortical cataract 2+ Nuclear sclerosis, 2+ Cortical cataract  ? Anterior Vitreous mild Vitreous syneresis, Posterior vitreous detachment mild Vitreous syneresis  ? ?  ?  ? ? Fundus Exam   ? ?   Right Left  ? Disc Pink and Sharp, temporal PPP Pink and Sharp, temporal PPP, no heme  ? C/D Ratio 0.5 0.5  ? Macula Good foveal reflex, superior MA/DBH/exudates, mild interval improvement in cystic changes ST mac, prominent cluster of exudates temporal and SN macula Flat, Good foveal reflex, RPE mottling and clumping, No  heme or edema, geographic area of hypopigmented RPE nasal and superior to fovea -- stable from prior  ? Vessels Vascular attenuation, Tortuous, AV crossing changes, ST arterioles with severe attenuation and peripheral sclerosis, superior BRVO/BRAO Vascular attenuation, Tortuous, AV crossing changes, peripheral sclerosis ST periphery  ? Periphery Attached, DBH temporal periphery -- extension of BRVO, bullous schisis cavity IT periphery (from 0700-0900) - stable; focal pigmented CR scar at 0130 midzone, no RT/RD Attached, no heme, WWP temporal and inferior quadrants, shallow schisis IT periphery  ? ?  ?  ? ?  ? ?Refraction   ? ? Wearing Rx   ? ?   Sphere Cylinder Axis Add  ? Right -4.50 +1.00 167 +2.25  ? Left -4.00 +0.50 015 +2.25  ? ?  ?  ? ?  ? ? ?IMAGING AND PROCEDURES  ?Imaging and Procedures for _0 @ ? ?OCT, Retina - OU - Both Eyes   ? ?   ?Right Eye ?Quality was good. Central Foveal Thickness: 245. Progression has improved. Findings include intraretinal hyper-reflective material, normal foveal contour, no SRF, intraretinal fluid (Mild interval improvement in temporal IRF/IRHM, stable schisis IT periphery -- not imaged today, partial PVD).  ? ?Left Eye ?Quality was good. Central Foveal Thickness: 235. Progression has been stable. Findings include normal foveal contour, no IRF, no SRF (Partial PVD, Shallow IT Retinoschisis -- not imaged today).  ? ?Notes ?*Images captured and stored on drive ? ?Diagnosis / Impression:  ?OD: superior BRVO with CME, ?BRAO component -- Mild interval improvement in temporal IRF/IRHM, stable schisis IT periphery caught on widefield -- not imaged today, partial PVD ?OS: NFP, no IRF/SRF; Partial PVD ? ?Clinical management:  ?See below ? ?Abbreviations: NFP - Normal foveal profile. CME - cystoid macular edema. PED - pigment epithelial detachment. IRF - intraretinal fluid. SRF - subretinal fluid. EZ - ellipsoid zone. ERM - epiretinal membrane. ORA - outer retinal atrophy. ORT - outer  retinal tubulation. SRHM - subretinal hyper-reflective material ? ? ?  ? ?Intravitreal Injection, Pharmacologic Agent - OD - Right Eye   ? ?   ?Time Out ?08/30/2021. 9:25 AM. Confirmed correct patient, procedure, site, and patient consented.  ? ?Anesthesia ?Topical anesthesia was used. Anesthetic medications included Lidocaine 2%, Proparacaine 0.5%.  ? ?Procedure ?Preparation included 5% betadine to ocular surface, eyelid speculum. A (32g) needle was used.  ? ?Injection: ?  2 mg aflibercept 2 MG/0.05ML ?  Route: Intravitreal, Site: Right Eye ?  [place]: [id number], Lot: [id number], Expiration date: 07/25/2022, Waste: 0.05 mL  ? ?Post-op ?Post injection exam found visual acuity of at least counting fingers. The patient tolerated the procedure well. There were no complications. The patient received written and verbal post procedure care education. Post injection medications were not given.  ? ?  ?  ?  ? ?  ?ASSESSMENT/PLAN: ? ?  ICD-10-CM   ?1. Branch retinal vein occlusion of right eye with macular edema  H34.8310 OCT, Retina - OU - Both Eyes  ?  Intravitreal Injection, Pharmacologic Agent - OD - Right Eye  ?  aflibercept (EYLEA) SOLN 2 mg  ?  ?2. Retinal artery branch occlusion of right eye  H34.231   ?  ?3. Essential hypertension  I10   ?  ?4. Hypertensive retinopathy of both eyes  H35.033   ?  ?5. Combined forms of age-related cataract of both eyes  H25.813   ?  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  ? ?1,2. BRVO with CME OD ? - s/p IVA OD #1 (10.05.20), #2 (11.02.20), #3 (11.30.20), #4 (01.07.21), #5 (02.05.21), #6 (03.05.21), #7 (04.02.21), #8 (05.04.21), #9 (06.03.21), #10 (07.22.21), #11 (08.19.21), #12 (09.16.21) -- IVA resistance ? - s/p IVE OD #1 (10.14.21), #2 (11.11.21), #3 (12.09.21), #4 (01.13.22), #5 (2.10.22), #6 (04.05.22), #7 (05.26.22), #8 (07.21.22), #9 (09.29.22), #10 (12.01.22), #11 (02.02.23) ? - **history of increased IRF at 10 wk interval, noted on 9.29.22 visit** ? - initial exam and OCT  findings suggestive of a BRAO component contributing ? - BCVA OD 20/25 ? - exam shows IRH/DBH and exudates improving ? - OCT shows Mild interval improvement in temporal IRF/IRHM at 9 weeks  ? - recommend IVE OD #12 t

## 2021-08-30 ENCOUNTER — Encounter (INDEPENDENT_AMBULATORY_CARE_PROVIDER_SITE_OTHER): Payer: Self-pay | Admitting: Ophthalmology

## 2021-08-30 ENCOUNTER — Ambulatory Visit (INDEPENDENT_AMBULATORY_CARE_PROVIDER_SITE_OTHER): Payer: 59 | Admitting: Ophthalmology

## 2021-08-30 DIAGNOSIS — I10 Essential (primary) hypertension: Secondary | ICD-10-CM | POA: Diagnosis not present

## 2021-08-30 DIAGNOSIS — H34231 Retinal artery branch occlusion, right eye: Secondary | ICD-10-CM

## 2021-08-30 DIAGNOSIS — H35033 Hypertensive retinopathy, bilateral: Secondary | ICD-10-CM | POA: Diagnosis not present

## 2021-08-30 DIAGNOSIS — H34831 Tributary (branch) retinal vein occlusion, right eye, with macular edema: Secondary | ICD-10-CM

## 2021-08-30 DIAGNOSIS — H25813 Combined forms of age-related cataract, bilateral: Secondary | ICD-10-CM

## 2021-08-30 DIAGNOSIS — H33103 Unspecified retinoschisis, bilateral: Secondary | ICD-10-CM

## 2021-08-30 MED ORDER — AFLIBERCEPT 2MG/0.05ML IZ SOLN FOR KALEIDOSCOPE
2.0000 mg | INTRAVITREAL | Status: AC | PRN
  Administered 2021-08-30: 2 mg via INTRAVITREAL

## 2021-10-18 NOTE — Progress Notes (Addendum)
[facility] Note  11/01/2021     CHIEF COMPLAINT Patient presents for Retina Follow Up   HISTORY OF PRESENT ILLNESS: Jacqueline Greer is a 61 y.o. female who presents to the clinic today for:   HPI     Retina Follow Up   Patient presents with  CRVO/BRVO.  In right eye.  Severity is moderate.  Duration of 9 weeks.  Since onset it is stable.  I, the attending physician,  performed the HPI with the patient and updated documentation appropriately.        Comments   Pt here for 9 wk ret f/u for BRVO OD. Pt states VA has been blurry since previous injection. Also reports some swelling in OD and soreness for days after last injection on 08/30/21. Pt states OD VA seems to have developed a cloud after last injection.        Last edited by Bernarda Caffey, MD on 11/01/2021 12:12 PM.    Patient has noticed a big decrease in vision with glare.   Referring physician: Monna Fam, MD [place],  [place] [address]  HISTORICAL INFORMATION:   Selected notes from the MEDICAL RECORD NUMBER Referred by Dr. Parke Simmers for concern of BRVO   CURRENT MEDICATIONS: No current outpatient medications on file. (Ophthalmic Drugs)   No current facility-administered medications for this visit. (Ophthalmic Drugs)   Current Outpatient Medications (Other)  Medication Sig   omeprazole (PRILOSEC) 20 MG capsule Take 20 mg by mouth daily.   No current facility-administered medications for this visit. (Other)   REVIEW OF SYSTEMS: ROS   Positive for: Eyes Negative for: Constitutional, Gastrointestinal, Neurological, Skin, Genitourinary, Musculoskeletal, HENT, Endocrine, Cardiovascular, Respiratory, Psychiatric, Allergic/Imm, Heme/Lymph Last edited by Kingsley Spittle, COT on 11/01/2021  8:04 AM.     ALLERGIES No Known Allergies  PAST MEDICAL HISTORY Past Medical History:  Diagnosis Date   Cataract    OU   Hypertensive retinopathy    OU   Past Surgical  History:  Procedure Laterality Date   LAPAROSCOPIC TUBAL LIGATION  2003   FAMILY HISTORY Family History  Problem Relation Age of Onset   Diabetes Mother    Kidney failure Father    SOCIAL HISTORY Social History   Tobacco Use   Smoking status: Never   Smokeless tobacco: Never  Vaping Use   Vaping Use: Never used  Substance Use Topics   Alcohol use: No   Drug use: No       OPHTHALMIC EXAM:  Base Eye Exam     Visual Acuity (Snellen - Linear)       Right Left   Dist cc 20/100 -1 20/30   Dist ph cc 20/30 -1 20/20    Correction: Glasses         Tonometry (Tonopen, 8:19 AM)       Right Left   Pressure 17 20         Pupils       Dark Light Shape React APD   Right 4 3 Round Brisk None   Left 4 3 Round Brisk None         Visual Fields (Counting fingers)       Left Right    Full Full         Extraocular Movement       Right Left    Full, Ortho Full, Ortho         Neuro/Psych  Oriented x3: Yes   Mood/Affect: Normal         Dilation     Both eyes: 1.0% Mydriacyl, 2.5% Phenylephrine @ 8:19 AM           Slit Lamp and Fundus Exam     Slit Lamp Exam       Right Left   Lids/Lashes Normal Normal   Conjunctiva/Sclera Nasal and temporal Pinguecula, Melanosis Nasal and Pinguecula, Melanosis   Cornea 1+ Punctate epithelial erosions trace Punctate epithelial erosions   Anterior Chamber Deep and clear Deep and clear   Iris Round and dilated, No NVI Round and dilated, No NVI   Lens 2-3+ Nuclear sclerosis with brunescence, 2+ Cortical cataract, 2-3+ Posterior subcapsular cataract 2+ Nuclear sclerosis, 2+ Cortical cataract   Anterior Vitreous mild Vitreous syneresis, Posterior vitreous detachment mild Vitreous syneresis         Fundus Exam       Right Left   Disc Pink and Sharp, temporal PPP Pink and Sharp, temporal PPP, no heme   C/D Ratio 0.5 0.5   Macula Good foveal reflex, superior MA/DBH/exudates, mild interval improvement in  cystic changes ST mac, prominent cluster of exudates temporal and SN macula Flat, Good foveal reflex, RPE mottling and clumping, No heme or edema, geographic area of hypopigmented RPE nasal and superior to fovea -- stable from prior   Vessels Vascular attenuation, Tortuous, AV crossing changes, ST arterioles with severe attenuation and peripheral sclerosis, superior BRVO/BRAO Vascular attenuation, Tortuous, AV crossing changes   Periphery Attached, DBH temporal periphery -- extension of BRVO, bullous schisis cavity IT periphery (from 0700-0900) - stable; focal pigmented CR scar at 0130 midzone, no RT/RD Attached, no heme, WWP temporal and inferior quadrants, shallow schisis IT periphery           Refraction     Wearing Rx       Sphere Cylinder Axis Add   Right -4.50 +1.00 167 +2.25   Left -4.00 +0.50 015 +2.25         Manifest Refraction       Sphere Cylinder Axis Dist VA   Right -4.25 +0.75 165 20/150+1   Left                IMAGING AND PROCEDURES  Imaging and Procedures for _0 @  OCT, Retina - OU - Both Eyes       Right Eye Quality was good. Central Foveal Thickness: 237. Progression has improved. Findings include normal foveal contour, no SRF, intraretinal hyper-reflective material, intraretinal fluid (Mild interval improvement in temporal IRF/IRHM, stable schisis IT periphery -- not imaged today, partial PVD).   Left Eye Quality was good. Central Foveal Thickness: 238. Progression has been stable. Findings include normal foveal contour, no IRF, no SRF (Partial PVD, Shallow IT Retinoschisis).   Notes *Images captured and stored on drive  Diagnosis / Impression:  OD: superior BRVO with CME, ?BRAO component -- Mild interval improvement in temporal IRF/IRHM, stable schisis IT periphery caught on widefield -- not imaged today, partial PVD OS: NFP, no IRF/SRF; Partial PVD, shallow IT retinoschisis   Clinical management:  See below  Abbreviations: NFP - Normal  foveal profile. CME - cystoid macular edema. PED - pigment epithelial detachment. IRF - intraretinal fluid. SRF - subretinal fluid. EZ - ellipsoid zone. ERM - epiretinal membrane. ORA - outer retinal atrophy. ORT - outer retinal tubulation. SRHM - subretinal hyper-reflective material      Intravitreal Injection, Pharmacologic Agent - OD - Right Eye  Time Out 11/01/2021. 8:48 AM. Confirmed correct patient, procedure, site, and patient consented.   Anesthesia Topical anesthesia was used. Anesthetic medications included Lidocaine 2%, Proparacaine 0.5%.   Procedure Preparation included 5% betadine to ocular surface, eyelid speculum. A (32g) needle was used.   Injection: 2 mg aflibercept 2 MG/0.05ML   Route: Intravitreal, Site: Right Eye   [facility]: [id number], Lot: [id number], Expiration date: 08/24/2022, Waste: 0 mL   Post-op Post injection exam found visual acuity of at least counting fingers. The patient tolerated the procedure well. There were no complications. The patient received written and verbal post procedure care education. Post injection medications were not given.            ASSESSMENT/PLAN:    ICD-10-CM   1. 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 Retinal artery branch occlusion of right eye  H34.231     3. Essential hypertension  I10     4. Hypertensive retinopathy of both eyes  H35.033     5. 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 BRVO with CME OD  - s/p IVA OD #1 (10.05.20), #2 (11.02.20), #3 (11.30.20), #4 (01.07.21), #5 (02.05.21), #6 (03.05.21), #7 (04.02.21), #8 (05.04.21), #9 (06.03.21), #10 (07.22.21), #11 (08.19.21), #12 (09.16.21) -- IVA resistance  - s/p IVE OD #1 (10.14.21), #2 (11.11.21), #3 (12.09.21), #4  (01.13.22), #5 (2.10.22), #6 (04.05.22), #7 (05.26.22), #8 (07.21.22), #9 (09.29.22), #10 (12.01.22), #11 (02.02.23), #12 (04.06.23)  - **history of increased IRF at 10 wk interval, noted on 9.29.22 visit**  - initial exam and OCT findings suggestive of a BRAO component contributing  - BCVA OD 20/30 -- slightly dec from 20/25 -- more from cataract progression  - exam shows IRH/DBH and exudates improving  - OCT shows Mild interval improvement in temporal IRF/IRHM at 9 weeks   - recommend IVE OD #13 today, 06.08.23 for BRVO w/ CME with follow up in 9 weeks  - pt wishes to proceed  - RBA of procedure discussed, questions answered  - informed consent obtained  - see procedure note  - [place] informed consent obtained and re-signed on 06.08.23  - [employer] benefits investigation started 9.16.21 -- approved for 2023 (Good Days Copay card)  - F/U 9 weeks -- DFE/OCT/possible injection, widefield OCT through schisis  3,4. Hypertensive retinopathy OU  - pt not formally diagnosed with HTN  - discussed importance of tight BP control and its relation to problems #1-2 above  - advised discussion with PCP and possible calibration of home BP cuff with PCP's  - monitor   5. 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  - discussed diagnosis and progression  - OD with interval progression of cataract -- now with significant PSC  - refer to Dr. Lucianne Lei or Dr. Herbert Deaner for cataract evaluation  6. Retinoschisis OU  - shallow schisis inferotemporal periphery OU -- stable  - stable on widefield OCT  - no RT/RD on scleral depression  - pt asymptomatic  - monitor   Ophthalmic Meds Ordered this visit:  Meds ordered this encounter  Medications   aflibercept (EYLEA) SOLN 2 mg  Return in about 9 weeks (around 01/03/2022) for DFE, OCT though schisis OU, possible injection.  There are no Patient Instructions on file for  this visit.   Explained the diagnoses, plan, and follow up with the patient and they expressed understanding.  Patient expressed understanding of the importance of proper follow up care.   This document serves as a record of services personally performed by Gardiner Sleeper, MD, PhD. It was created on their behalf by San Jetty. Owens Shark, OA an ophthalmic technician. The creation of this record is the provider's dictation and/or activities during the visit.    Electronically signed by: San Jetty. Owens Shark, [place] 05.25.2023 12:18 PM  Gardiner Sleeper, M.D., Ph.D. Diseases & Surgery of the Retina and Vitreous [facility]  I have reviewed the above documentation for accuracy and completeness, and I agree with the above. Gardiner Sleeper, M.D., Ph.D. 11/01/21 12:18 PM  Abbreviations: M myopia (nearsighted); A astigmatism; H hyperopia (farsighted); P presbyopia; Mrx spectacle prescription;  CTL contact lenses; OD right eye; OS left eye; OU both eyes  XT exotropia; ET esotropia; PEK punctate epithelial keratitis; PEE punctate epithelial erosions; DES dry eye syndrome; MGD meibomian gland dysfunction; ATs artificial tears; PFAT's preservative free artificial tears; Donald nuclear sclerotic cataract; PSC posterior subcapsular cataract; ERM epi-retinal membrane; PVD posterior vitreous detachment; RD retinal detachment; DM diabetes mellitus; DR diabetic retinopathy; NPDR non-proliferative diabetic retinopathy; PDR proliferative diabetic retinopathy; CSME clinically significant macular edema; DME diabetic macular edema; dbh dot blot hemorrhages; CWS cotton wool spot; POAG primary open angle glaucoma; C/D cup-to-disc ratio; HVF humphrey visual field; GVF goldmann visual field; OCT optical coherence tomography; IOP intraocular pressure; BRVO Branch retinal vein occlusion; CRVO central retinal vein occlusion; CRAO central retinal artery occlusion; BRAO branch retinal artery occlusion; RT retinal tear; SB  scleral buckle; PPV pars plana vitrectomy; VH Vitreous hemorrhage; PRP panretinal laser photocoagulation; IVK intravitreal kenalog; VMT vitreomacular traction; MH Macular hole;  NVD neovascularization of the disc; NVE neovascularization elsewhere; AREDS age related eye disease study; ARMD age related macular degeneration; POAG primary open angle glaucoma; EBMD epithelial/anterior basement membrane dystrophy; ACIOL anterior chamber intraocular lens; IOL intraocular lens; PCIOL posterior chamber intraocular lens; Phaco/IOL phacoemulsification with intraocular lens placement; [place]ard photorefractive keratectomy; LASIK laser assisted in situ keratomileusis; HTN hypertension; DM diabetes mellitus; COPD chronic obstructive pulmonary disease

## 2021-11-01 ENCOUNTER — Encounter (INDEPENDENT_AMBULATORY_CARE_PROVIDER_SITE_OTHER): Payer: Self-pay | Admitting: Ophthalmology

## 2021-11-01 ENCOUNTER — Ambulatory Visit (INDEPENDENT_AMBULATORY_CARE_PROVIDER_SITE_OTHER): Payer: 59 | Admitting: Ophthalmology

## 2021-11-01 DIAGNOSIS — H35033 Hypertensive retinopathy, bilateral: Secondary | ICD-10-CM

## 2021-11-01 DIAGNOSIS — I10 Essential (primary) hypertension: Secondary | ICD-10-CM

## 2021-11-01 DIAGNOSIS — H34231 Retinal artery branch occlusion, right eye: Secondary | ICD-10-CM

## 2021-11-01 DIAGNOSIS — H34831 Tributary (branch) retinal vein occlusion, right eye, with macular edema: Secondary | ICD-10-CM

## 2021-11-01 DIAGNOSIS — H33103 Unspecified retinoschisis, bilateral: Secondary | ICD-10-CM

## 2021-11-01 DIAGNOSIS — H25813 Combined forms of age-related cataract, bilateral: Secondary | ICD-10-CM

## 2021-11-01 MED ORDER — AFLIBERCEPT 2MG/0.05ML IZ SOLN FOR KALEIDOSCOPE
2.0000 mg | INTRAVITREAL | Status: AC | PRN
  Administered 2021-11-01: 2 mg via INTRAVITREAL

## 2021-12-25 NOTE — Progress Notes (Signed)
[facility] Note  01/03/2022     CHIEF COMPLAINT Patient presents for Retina Follow Up   HISTORY OF PRESENT ILLNESS: Jacqueline Greer is a 61 y.o. female who presents to the clinic today for:   HPI     Retina Follow Up   Patient presents with  CRVO/BRVO (IVE OD 06.08.23).  In right eye.  This started months ago.  Severity is moderate.  Duration of 9 weeks.  Since onset it is stable.  I, the attending physician,  performed the HPI with the patient and updated documentation appropriately.        Comments   Patient feels that the vision is cloudy. She has an appointment with Dr. Herbert Deaner 01/17/22 for a cataract eval.       Last edited by Bernarda Caffey, MD on 01/03/2022  1:14 PM.    Patient states vision is still cloudy in the right eye  Referring physician: Julian Hy, PA-C [address] ,  [place] [address]  HISTORICAL INFORMATION:   Selected notes from the MEDICAL RECORD NUMBER Referred by Dr. Parke Simmers for concern of BRVO   CURRENT MEDICATIONS: No current outpatient medications on file. (Ophthalmic Drugs)   No current facility-administered medications for this visit. (Ophthalmic Drugs)   Current Outpatient Medications (Other)  Medication Sig   omeprazole (PRILOSEC) 20 MG capsule Take 20 mg by mouth daily.   No current facility-administered medications for this visit. (Other)   REVIEW OF SYSTEMS: ROS   Positive for: Eyes Negative for: Constitutional, Gastrointestinal, Neurological, Skin, Genitourinary, Musculoskeletal, HENT, Endocrine, Cardiovascular, Respiratory, Psychiatric, Allergic/Imm, Heme/Lymph Last edited by Annie Paras, COT on 01/03/2022  8:07 AM.     ALLERGIES No Known Allergies  PAST MEDICAL HISTORY Past Medical History:  Diagnosis Date   Cataract    OU   Hypertensive retinopathy    OU   Past Surgical History:  Procedure Laterality Date   LAPAROSCOPIC TUBAL LIGATION  2003   FAMILY  HISTORY Family History  Problem Relation Age of Onset   Diabetes Mother    Kidney failure Father    SOCIAL HISTORY Social History   Tobacco Use   Smoking status: Never   Smokeless tobacco: Never  Vaping Use   Vaping Use: Never used  Substance Use Topics   Alcohol use: No   Drug use: No       OPHTHALMIC EXAM:  Base Eye Exam     Visual Acuity (Snellen - Linear)       Right Left   Dist cc 20/150 20/25   Dist ph cc 20/40 +2     Correction: Glasses         Tonometry (Tonopen, 8:12 AM)       Right Left   Pressure 17 15         Pupils       Dark Light Shape React APD   Right 4 3 Round Brisk None   Left 4 3 Round Brisk None         Visual Fields       Left Right    Full Full         Extraocular Movement       Right Left    Full, Ortho Full, Ortho         Neuro/Psych     Oriented x3: Yes   Mood/Affect: Normal         Dilation     Both eyes: 1.0% Mydriacyl,  2.5% Phenylephrine @ 8:09 AM           Slit Lamp and Fundus Exam     Slit Lamp Exam       Right Left   Lids/Lashes Normal Normal   Conjunctiva/Sclera Nasal and temporal Pinguecula, Melanosis Nasal and Pinguecula, Melanosis   Cornea 1+ Punctate epithelial erosions trace Punctate epithelial erosions   Anterior Chamber Deep and clear Deep and clear   Iris Round and dilated, No NVI Round and dilated, No NVI   Lens 2-3+ Nuclear sclerosis with brunescence, 2+ Cortical cataract, 2-3+ Posterior subcapsular cataract 2+ Nuclear sclerosis, 2+ Cortical cataract   Anterior Vitreous mild Vitreous syneresis, Posterior vitreous detachment mild Vitreous syneresis         Fundus Exam       Right Left   Disc Pink and Sharp, temporal PPP Pink and Sharp, temporal PPP, no heme   C/D Ratio 0.5 0.5   Macula Good foveal reflex, superior MA/DBH/exudates, stable improvement in cystic changes ST mac, persistent cystic changes temporal macula, prominent cluster of exudates temporal and SN macula  Flat, Good foveal reflex, RPE mottling and clumping, No heme or edema, geographic area of hypopigmented RPE nasal and superior to fovea -- stable from prior   Vessels Vascular attenuation, Tortuous, AV crossing changes, ST arterioles with severe attenuation and peripheral sclerosis, superior BRVO/BRAO Vascular attenuation, Tortuous, AV crossing changes   Periphery Attached, DBH temporal periphery -- extension of BRVO, bullous schisis cavity IT periphery (from 0700-0900) - stable; focal pigmented CR scar at 0130 midzone, no RT/RD Attached, no heme, WWP temporal and inferior quadrants, shallow schisis IT periphery           Refraction     Wearing Rx       Sphere Cylinder Axis Add   Right -4.50 +1.00 167 +2.25   Left -4.00 +0.50 015 +2.25           IMAGING AND PROCEDURES  Imaging and Procedures for _0 @  OCT, Retina - OU - Both Eyes       Right Eye Quality was good. Central Foveal Thickness: 240. Progression has improved. Findings include normal foveal contour, no SRF, intraretinal hyper-reflective material, intraretinal fluid (Mild interval improvement in temporal IRF/IRHM, stable schisis IT periphery -- not imaged today, partial PVD).   Left Eye Quality was good. Central Foveal Thickness: 233. Progression has been stable. Findings include normal foveal contour, no IRF, no SRF (Partial PVD, Shallow IT Retinoschisis).   Notes *Images captured and stored on drive  Diagnosis / Impression:  OD: superior BRVO with CME, ?BRAO component -- Mild interval improvement in temporal IRF/IRHM, stable schisis IT periphery caught on widefield -- not imaged today, partial PVD OS: NFP, no IRF/SRF; Partial PVD, shallow IT retinoschisis   Clinical management:  See below  Abbreviations: NFP - Normal foveal profile. CME - cystoid macular edema. PED - pigment epithelial detachment. IRF - intraretinal fluid. SRF - subretinal fluid. EZ - ellipsoid zone. ERM - epiretinal membrane. ORA - outer  retinal atrophy. ORT - outer retinal tubulation. SRHM - subretinal hyper-reflective material      Intravitreal Injection, Pharmacologic Agent - OD - Right Eye       Time Out 01/03/2022. 9:34 AM. Confirmed correct patient, procedure, site, and patient consented.   Anesthesia Topical anesthesia was used. Anesthetic medications included Lidocaine 2%, Proparacaine 0.5%.   Procedure Preparation included 5% betadine to ocular surface, eyelid speculum. A (32g) needle was used.   Injection: 2 mg aflibercept 2  MG/0.05ML   Route: Intravitreal, Site: Right Eye   NDC: [id number], Lot: [id number], Expiration date: 02/24/2023, Waste: 0 mL   Post-op Post injection exam found visual acuity of at least counting fingers. The patient tolerated the procedure well. There were no complications. The patient received written and verbal post procedure care education. Post injection medications were not given.            ASSESSMENT/PLAN:    ICD-10-CM   1. 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 Retinal artery branch occlusion of right eye  H34.231     3. Essential hypertension  I10     4. Hypertensive retinopathy of both eyes  H35.033     5. 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 BRVO with CME OD  - s/p IVA OD #1 (10.05.20), #2 (11.02.20), #3 (11.30.20), #4 (01.07.21), #5 (02.05.21), #6 (03.05.21), #7 (04.02.21), #8 (05.04.21), #9 (06.03.21), #10 (07.22.21), #11 (08.19.21), #12 (09.16.21) -- IVA resistance  - s/p IVE OD #1 (10.14.21), #2 (11.11.21), #3 (12.09.21), #4 (01.13.22), #5 (2.10.22), #6 (04.05.22), #7 (05.26.22), #8 (07.21.22), #9 (09.29.22), #10 (12.01.22), #11 (02.02.23), #12 (04.06.23)  - **history of increased IRF at 10 wk interval, noted on  9.29.22 visit**  - initial exam and OCT findings suggestive of a BRAO component contributing  - BCVA OD 20/40 -- slightly dec from 20/30 -- more from cataract progression  - exam shows IRH/DBH and exudates improving  - OCT shows Mild interval improvement in temporal IRF/IRHM at 9 weeks   - recommend IVE OD #13 today, 06.08.23 for BRVO w/ CME with follow up in 9 weeks  - pt wishes to proceed  - RBA of procedure discussed, questions answered  - informed consent obtained  - see procedure note  - Avastin informed consent obtained and re-signed on 06.08.23  - [employer] benefits investigation started 9.16.21 -- approved for 2023 (Good Days Copay card)  - F/U 9 weeks -- DFE/OCT/possible injection, widefield OCT through schisis  3,4. Hypertensive retinopathy OU  - pt not formally diagnosed with HTN  - discussed importance of tight BP control and its relation to problems #1-2 above  - advised discussion with PCP and possible calibration of home BP cuff with PCP's  - monitor   5. 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  - discussed diagnosis and progression  - OD with interval progression of cataract -- now with significant PSC  - pt has an appt with Dr. Herbert Deaner for cataract eval on January 17, 2022  6. Retinoschisis OU  - shallow schisis inferotemporal periphery OU -- stable  - stable on widefield OCT  - no RT/RD on scleral depression  - pt asymptomatic  - monitor   Ophthalmic Meds Ordered this visit:  Meds ordered this encounter  Medications   aflibercept (EYLEA) SOLN 2 mg     Return in about 9 weeks (around 03/07/2022) for f/u BRVO OD, DFE, OCT.  There are no Patient Instructions on file for this visit.   Explained the diagnoses, plan, and follow up with the patient and they expressed understanding.  Patient expressed understanding of  the importance of proper follow up care.   This  document serves as a record of services personally performed by Gardiner Sleeper, MD, PhD. It was created on their behalf by San Jetty. Owens Shark, OA an ophthalmic technician. The creation of this record is the provider's dictation and/or activities during the visit.    Electronically signed by: San Jetty. Owens Shark, [place] 08.01.2023 1:15 PM   Gardiner Sleeper, M.D., Ph.D. Diseases & Surgery of the Retina and Vitreous [facility]  I have reviewed the above documentation for accuracy and completeness, and I agree with the above. Gardiner Sleeper, M.D., Ph.D. 01/03/22 1:16 PM   Abbreviations: M myopia (nearsighted); A astigmatism; H hyperopia (farsighted); P presbyopia; Mrx spectacle prescription;  CTL contact lenses; OD right eye; OS left eye; OU both eyes  XT exotropia; ET esotropia; PEK punctate epithelial keratitis; PEE punctate epithelial erosions; DES dry eye syndrome; MGD meibomian gland dysfunction; ATs artificial tears; PFAT's preservative free artificial tears; [place] nuclear sclerotic cataract; PSC posterior subcapsular cataract; ERM epi-retinal membrane; PVD posterior vitreous detachment; RD retinal detachment; DM diabetes mellitus; DR diabetic retinopathy; NPDR non-proliferative diabetic retinopathy; PDR proliferative diabetic retinopathy; CSME clinically significant macular edema; DME diabetic macular edema; dbh dot blot hemorrhages; CWS cotton wool spot; POAG primary open angle glaucoma; C/D cup-to-disc ratio; HVF humphrey visual field; GVF goldmann visual field; OCT optical coherence tomography; IOP intraocular pressure; BRVO Branch retinal vein occlusion; CRVO central retinal vein occlusion; CRAO central retinal artery occlusion; BRAO branch retinal artery occlusion; RT retinal tear; SB scleral buckle; PPV pars plana vitrectomy; VH Vitreous hemorrhage; PRP panretinal laser photocoagulation; IVK intravitreal kenalog; VMT vitreomacular traction; MH Macular hole;  NVD neovascularization  of the disc; NVE neovascularization elsewhere; AREDS age related eye disease study; ARMD age related macular degeneration; POAG primary open angle glaucoma; EBMD epithelial/anterior basement membrane dystrophy; ACIOL anterior chamber intraocular lens; IOL intraocular lens; PCIOL posterior chamber intraocular lens; Phaco/IOL phacoemulsification with intraocular lens placement; [place] photorefractive keratectomy; LASIK laser assisted in situ keratomileusis; HTN hypertension; DM diabetes mellitus; COPD chronic obstructive pulmonary disease

## 2022-01-03 ENCOUNTER — Encounter (INDEPENDENT_AMBULATORY_CARE_PROVIDER_SITE_OTHER): Payer: Self-pay | Admitting: Ophthalmology

## 2022-01-03 ENCOUNTER — Ambulatory Visit (INDEPENDENT_AMBULATORY_CARE_PROVIDER_SITE_OTHER): Payer: 59 | Admitting: Ophthalmology

## 2022-01-03 DIAGNOSIS — I10 Essential (primary) hypertension: Secondary | ICD-10-CM

## 2022-01-03 DIAGNOSIS — H34831 Tributary (branch) retinal vein occlusion, right eye, with macular edema: Secondary | ICD-10-CM | POA: Diagnosis not present

## 2022-01-03 DIAGNOSIS — H35033 Hypertensive retinopathy, bilateral: Secondary | ICD-10-CM | POA: Diagnosis not present

## 2022-01-03 DIAGNOSIS — H25813 Combined forms of age-related cataract, bilateral: Secondary | ICD-10-CM

## 2022-01-03 DIAGNOSIS — H34231 Retinal artery branch occlusion, right eye: Secondary | ICD-10-CM | POA: Diagnosis not present

## 2022-01-03 DIAGNOSIS — H33103 Unspecified retinoschisis, bilateral: Secondary | ICD-10-CM

## 2022-01-03 MED ORDER — AFLIBERCEPT 2MG/0.05ML IZ SOLN FOR KALEIDOSCOPE
2.0000 mg | INTRAVITREAL | Status: AC | PRN
  Administered 2022-01-03: 2 mg via INTRAVITREAL

## 2022-03-07 ENCOUNTER — Encounter (INDEPENDENT_AMBULATORY_CARE_PROVIDER_SITE_OTHER): Payer: 59 | Admitting: Ophthalmology

## 2022-03-07 DIAGNOSIS — H34231 Retinal artery branch occlusion, right eye: Secondary | ICD-10-CM

## 2022-03-07 DIAGNOSIS — H33103 Unspecified retinoschisis, bilateral: Secondary | ICD-10-CM

## 2022-03-07 DIAGNOSIS — I10 Essential (primary) hypertension: Secondary | ICD-10-CM

## 2022-03-07 DIAGNOSIS — H35033 Hypertensive retinopathy, bilateral: Secondary | ICD-10-CM

## 2022-03-07 DIAGNOSIS — H34831 Tributary (branch) retinal vein occlusion, right eye, with macular edema: Secondary | ICD-10-CM

## 2022-03-07 DIAGNOSIS — H25813 Combined forms of age-related cataract, bilateral: Secondary | ICD-10-CM

## 2022-03-19 ENCOUNTER — Other Ambulatory Visit (INDEPENDENT_AMBULATORY_CARE_PROVIDER_SITE_OTHER): Payer: Self-pay

## 2022-03-19 DIAGNOSIS — H34831 Tributary (branch) retinal vein occlusion, right eye, with macular edema: Secondary | ICD-10-CM

## 2022-03-19 DIAGNOSIS — I10 Essential (primary) hypertension: Secondary | ICD-10-CM

## 2022-03-19 DIAGNOSIS — H33103 Unspecified retinoschisis, bilateral: Secondary | ICD-10-CM

## 2022-03-19 DIAGNOSIS — H35033 Hypertensive retinopathy, bilateral: Secondary | ICD-10-CM

## 2022-03-19 DIAGNOSIS — H25813 Combined forms of age-related cataract, bilateral: Secondary | ICD-10-CM

## 2022-03-19 DIAGNOSIS — H34231 Retinal artery branch occlusion, right eye: Secondary | ICD-10-CM

## 2022-03-19 NOTE — Progress Notes (Signed)
[facility] Clinic Note  03/19/2022     CHIEF COMPLAINT Patient presents for No chief complaint on file.   HISTORY OF PRESENT ILLNESS: Jacqueline Greer is a 61 y.o. female who presents to the clinic today for:    Patient states vision is still cloudy in the right eye  Referring physician: No referring provider defined for this encounter.  HISTORICAL INFORMATION:   Selected notes from the MEDICAL RECORD NUMBER Referred by Dr. Krista Blue for concern of BRVO   CURRENT MEDICATIONS: No current outpatient medications on file. (Ophthalmic Drugs)   No current facility-administered medications for this visit. (Ophthalmic Drugs)   Current Outpatient Medications (Other)  Medication Sig   omeprazole (PRILOSEC) 20 MG capsule Take 20 mg by mouth daily.   No current facility-administered medications for this visit. (Other)   REVIEW OF SYSTEMS:   ALLERGIES No Known Allergies  PAST MEDICAL HISTORY Past Medical History:  Diagnosis Date   Cataract    OU   Hypertensive retinopathy    OU   Past Surgical History:  Procedure Laterality Date   LAPAROSCOPIC TUBAL LIGATION  2003   FAMILY HISTORY Family History  Problem Relation Age of Onset   Diabetes Mother    Kidney failure Father    SOCIAL HISTORY Social History   Tobacco Use   Smoking status: Never   Smokeless tobacco: Never  Vaping Use   Vaping Use: Never used  Substance Use Topics   Alcohol use: No   Drug use: No       OPHTHALMIC EXAM:  Not recorded    IMAGING AND PROCEDURES  Imaging and Procedures for @TODAY @          ASSESSMENT/PLAN:    ICD-10-CM   1. Branch retinal vein occlusion of right eye with macular edema  H34.8310     2. Retinal artery branch occlusion of right eye  H34.231     3. Essential hypertension  I10     4. Hypertensive retinopathy of both eyes  H35.033     5. Combined forms of age-related cataract of both eyes  H25.813     6. 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 BRVO with CME OD  - s/p IVA OD #1 (10.05.20), #2 (11.02.20), #3 (11.30.20), #4 (01.07.21), #5 (02.05.21), #6 (03.05.21), #7 (04.02.21), #8 (05.04.21), #9 (06.03.21), #10 (07.22.21), #11 (08.19.21), #12 (09.16.21) -- IVA resistance  - s/p IVE OD #1 (10.14.21), #2 (11.11.21), #3 (12.09.21), #4 (01.13.22), #5 (2.10.22), #6 (04.05.22), #7 (05.26.22), #8 (07.21.22), #9 (09.29.22), #10 (12.01.22), #11 (02.02.23), #12 (04.06.23)  - **history of increased IRF at 10 wk interval, noted on 9.29.22 visit**  - initial exam and OCT findings suggestive of a BRAO component contributing  - BCVA OD 20/40 -- slightly dec from 20/30 -- more from cataract progression  - exam shows IRH/DBH and exudates improving  - OCT shows Mild interval improvement in temporal IRF/IRHM at 9 weeks   - recommend IVE OD #13 today, 06.08.23 for BRVO w/ CME with follow up in 9 weeks  - pt wishes to proceed  - RBA of procedure discussed, questions answered  - informed consent obtained  - see procedure note  - Avastin informed consent obtained and re-signed on 06.08.23  - [employer] benefits investigation started 9.16.21 -- approved for 2023 (Good Days Copay card)  - F/U 9 weeks -- DFE/OCT/possible injection, widefield OCT through schisis  3,4. Hypertensive retinopathy OU  - pt not formally diagnosed with  HTN  - discussed importance of tight BP control and its relation to problems #1-2 above  - advised discussion with PCP and possible calibration of home BP cuff with PCP's  - monitor   5. 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  - discussed diagnosis and progression  - OD with interval progression of cataract -- now with significant PSC  - pt has an appt with Dr. Herbert Deaner for cataract eval on January 17, 2022  6. Retinoschisis OU  - shallow schisis inferotemporal periphery OU -- stable  - stable on widefield OCT  - no  RT/RD on scleral depression  - pt asymptomatic  - monitor   Ophthalmic Meds Ordered this visit:  No orders of the defined types were placed in this encounter.    No follow-ups on file.  There are no Patient Instructions on file for this visit.   Explained the diagnoses, plan, and follow up with the patient and they expressed understanding.  Patient expressed understanding of the importance of proper follow up care.   This document serves as a record of services personally performed by Gardiner Sleeper, MD, PhD. It was created on their behalf by San Jetty. Owens Shark, OA an ophthalmic technician. The creation of this record is the provider's dictation and/or activities during the visit.    Electronically signed by: San Jetty. [place], [place] 10.24.2023 2:36 PM    Gardiner Sleeper, M.D., Ph.D. Diseases & Surgery of the Retina and Vitreous [facility]: M myopia (nearsighted); A astigmatism; H hyperopia (farsighted); P presbyopia; Mrx spectacle prescription;  CTL contact lenses; OD right eye; OS left eye; OU both eyes  XT exotropia; ET esotropia; PEK punctate epithelial keratitis; PEE punctate epithelial erosions; DES dry eye syndrome; MGD meibomian gland dysfunction; ATs artificial tears; PFAT's preservative free artificial tears; [place] nuclear sclerotic cataract; PSC posterior subcapsular cataract; ERM epi-retinal membrane; PVD posterior vitreous detachment; RD retinal detachment; DM diabetes mellitus; DR diabetic retinopathy; NPDR non-proliferative diabetic retinopathy; PDR proliferative diabetic retinopathy; CSME clinically significant macular edema; DME diabetic macular edema; dbh dot blot hemorrhages; CWS cotton wool spot; POAG primary open angle glaucoma; C/D cup-to-disc ratio; HVF humphrey visual field; GVF goldmann visual field; OCT optical coherence tomography; IOP intraocular pressure; BRVO Branch retinal vein occlusion; CRVO central retinal vein occlusion;  CRAO central retinal artery occlusion; BRAO branch retinal artery occlusion; RT retinal tear; SB scleral buckle; PPV pars plana vitrectomy; VH Vitreous hemorrhage; PRP panretinal laser photocoagulation; IVK intravitreal kenalog; VMT vitreomacular traction; MH Macular hole;  NVD neovascularization of the disc; NVE neovascularization elsewhere; AREDS age related eye disease study; ARMD age related macular degeneration; POAG primary open angle glaucoma; EBMD epithelial/anterior basement membrane dystrophy; ACIOL anterior chamber intraocular lens; IOL intraocular lens; PCIOL posterior chamber intraocular lens; Phaco/IOL phacoemulsification with intraocular lens placement; [place] photorefractive keratectomy; LASIK laser assisted in situ keratomileusis; HTN hypertension; DM diabetes mellitus; COPD chronic obstructive pulmonary disease

## 2022-03-22 NOTE — Progress Notes (Shared)
[facility] Note  04/04/2022     CHIEF COMPLAINT Patient presents for No chief complaint on file.   HISTORY OF PRESENT ILLNESS: Jacqueline Greer is a 61 y.o. female who presents to the clinic today for:    Patient states vision is still cloudy in the right eye  Referring physician: Julian Hy, PA-C [place] ,  [place] [id number]  HISTORICAL INFORMATION:   Selected notes from the MEDICAL RECORD NUMBER Referred by Dr. Parke Simmers for concern of BRVO   CURRENT MEDICATIONS: No current outpatient medications on file. (Ophthalmic Drugs)   No current facility-administered medications for this visit. (Ophthalmic Drugs)   Current Outpatient Medications (Other)  Medication Sig   omeprazole (PRILOSEC) 20 MG capsule Take 20 mg by mouth daily.   No current facility-administered medications for this visit. (Other)   REVIEW OF SYSTEMS:   ALLERGIES No Known Allergies  PAST MEDICAL HISTORY Past Medical History:  Diagnosis Date   Cataract    OU   Hypertensive retinopathy    OU   Past Surgical History:  Procedure Laterality Date   LAPAROSCOPIC TUBAL LIGATION  2003   FAMILY HISTORY Family History  Problem Relation Age of Onset   Diabetes Mother    Kidney failure Father    SOCIAL HISTORY Social History   Tobacco Use   Smoking status: Never   Smokeless tobacco: Never  Vaping Use   Vaping Use: Never used  Substance Use Topics   Alcohol use: No   Drug use: No       OPHTHALMIC EXAM:  Not recorded    IMAGING AND PROCEDURES  Imaging and Procedures for @TODAY @          ASSESSMENT/PLAN:    ICD-10-CM   1. Branch retinal vein occlusion of right eye with macular edema  H34.8310     2. Retinal artery branch occlusion of right eye  H34.231     3. Essential hypertension  I10     4. Hypertensive retinopathy of both eyes  H35.033     5. Combined forms of age-related cataract of both eyes  H25.813     6.  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 BRVO with CME OD  - s/p IVA OD #1 (10.05.20), #2 (11.02.20), #3 (11.30.20), #4 (01.07.21), #5 (02.05.21), #6 (03.05.21), #7 (04.02.21), #8 (05.04.21), #9 (06.03.21), #10 (07.22.21), #11 (08.19.21), #12 (09.16.21) -- IVA resistance  - s/p IVE OD #1 (10.14.21), #2 (11.11.21), #3 (12.09.21), #4 (01.13.22), #5 (2.10.22), #6 (04.05.22), #7 (05.26.22), #8 (07.21.22), #9 (09.29.22), #10 (12.01.22), #11 (02.02.23), #12 (04.06.23), #13 (06.08.23)  - **history of increased IRF at 10 wk interval, noted on 9.29.22 visit**  - initial exam and OCT findings suggestive of a BRAO component contributing  - BCVA OD 20/40 -- slightly dec from 20/30 -- more from cataract progression  - exam shows IRH/DBH and exudates improving  - OCT shows Mild interval improvement in temporal IRF/IRHM at 9 weeks   - recommend IVE OD #14 today, 11.09.23 for BRVO w/ CME with follow up in 9 weeks  - pt wishes to proceed  - RBA of procedure discussed, questions answered  - informed consent obtained  - see procedure note  - Avastin informed consent obtained and re-signed on 06.08.23  - [employer] benefits investigation started 9.16.21 -- approved for 2023 (Good Days Copay card)  - F/U 9 weeks -- DFE/OCT/possible injection, widefield OCT through schisis  3,4. Hypertensive retinopathy OU  -  pt not formally diagnosed with HTN  - discussed importance of tight BP control and its relation to problems #1-2 above  - advised discussion with PCP and possible calibration of home BP cuff with PCP's  - monitor   5. 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  - discussed diagnosis and progression  - OD with interval progression of cataract -- now with significant PSC  - pt has an appt with Dr. Elmer Picker for cataract eval on January 17, 2022  6. Retinoschisis OU  - shallow schisis inferotemporal periphery OU --  stable  - stable on widefield OCT  - no RT/RD on scleral depression  - pt asymptomatic  - monitor   Ophthalmic Meds Ordered this visit:  No orders of the defined types were placed in this encounter.    No follow-ups on file.  There are no Patient Instructions on file for this visit.   Explained the diagnoses, plan, and follow up with the patient and they expressed understanding.  Patient expressed understanding of the importance of proper follow up care.   This document serves as a record of services personally performed by Karie Chimera, MD, PhD. It was created on their behalf by Glee Arvin. Manson Passey, OA an ophthalmic technician. The creation of this record is the provider's dictation and/or activities during the visit.    Electronically signed by: Glee Arvin. Manson Passey, [place] 10.24.2023 12:25 PM    Karie Chimera, M.D., Ph.D. Diseases & Surgery of the Retina and Vitreous [facility]     Abbreviations: M myopia (nearsighted); A astigmatism; H hyperopia (farsighted); P presbyopia; Mrx spectacle prescription;  CTL contact lenses; OD right eye; OS left eye; OU both eyes  XT exotropia; ET esotropia; PEK punctate epithelial keratitis; PEE punctate epithelial erosions; DES dry eye syndrome; MGD meibomian gland dysfunction; ATs artificial tears; PFAT's preservative free artificial tears; NSC nuclear sclerotic cataract; PSC posterior subcapsular cataract; ERM epi-retinal membrane; PVD posterior vitreous detachment; RD retinal detachment; DM diabetes mellitus; DR diabetic retinopathy; NPDR non-proliferative diabetic retinopathy; PDR proliferative diabetic retinopathy; CSME clinically significant macular edema; DME diabetic macular edema; dbh dot blot hemorrhages; CWS cotton wool spot; POAG primary open angle glaucoma; C/D cup-to-disc ratio; HVF humphrey visual field; GVF goldmann visual field; OCT optical coherence tomography; IOP intraocular pressure; BRVO Branch retinal vein  occlusion; CRVO central retinal vein occlusion; CRAO central retinal artery occlusion; BRAO branch retinal artery occlusion; RT retinal tear; SB scleral buckle; PPV pars plana vitrectomy; VH Vitreous hemorrhage; PRP panretinal laser photocoagulation; IVK intravitreal kenalog; VMT vitreomacular traction; MH Macular hole;  NVD neovascularization of the disc; NVE neovascularization elsewhere; AREDS age related eye disease study; ARMD age related macular degeneration; POAG primary open angle glaucoma; EBMD epithelial/anterior basement membrane dystrophy; ACIOL anterior chamber intraocular lens; IOL intraocular lens; PCIOL posterior chamber intraocular lens; Phaco/IOL phacoemulsification with intraocular lens placement; PRK photorefractive keratectomy; LASIK laser assisted in situ keratomileusis; HTN hypertension; DM diabetes mellitus; COPD chronic obstructive pulmonary disease

## 2022-04-04 ENCOUNTER — Encounter (INDEPENDENT_AMBULATORY_CARE_PROVIDER_SITE_OTHER): Payer: 59 | Admitting: Ophthalmology

## 2022-04-04 DIAGNOSIS — H35033 Hypertensive retinopathy, bilateral: Secondary | ICD-10-CM

## 2022-04-04 DIAGNOSIS — H25813 Combined forms of age-related cataract, bilateral: Secondary | ICD-10-CM

## 2022-04-04 DIAGNOSIS — H34231 Retinal artery branch occlusion, right eye: Secondary | ICD-10-CM

## 2022-04-04 DIAGNOSIS — H33103 Unspecified retinoschisis, bilateral: Secondary | ICD-10-CM

## 2022-04-04 DIAGNOSIS — I10 Essential (primary) hypertension: Secondary | ICD-10-CM

## 2022-04-04 DIAGNOSIS — H34831 Tributary (branch) retinal vein occlusion, right eye, with macular edema: Secondary | ICD-10-CM

## 2022-04-11 NOTE — Progress Notes (Signed)
[facility] - Clinic Note  04/25/2022     CHIEF COMPLAINT Patient presents for Retina Follow Up   HISTORY OF PRESENT ILLNESS: Jacqueline Greer is a 61 y.o. female who presents to the clinic today for:   HPI     Retina Follow Up   Patient presents with  CRVO/BRVO (IVE OD 06.08.23).  In right eye.  This started months ago.  Severity is moderate.  Duration of 14 weeks.  Since onset it is stable.  I, the attending physician,  performed the HPI with the patient and updated documentation appropriately.        Comments   Patient states that after the cataract surgery of the right eye she could see very well but the swelling has come back. She is using Pred OD QID and Ketorolac OD QID. Cataract surgery was 03/13/22.      Last edited by Rennis Chris, MD on 04/25/2022 11:16 AM.    Patient lost to follow up since 08.10.23, pt has cataract sx OD with Dr. Zenaida Niece on 10.18.23, pt has post op CME and has been on PF and ketorolac QID OD, pt states right after cataract sx her vision was very sharp  Referring physician: Luciano Cutter, PA-C [address] [place] ,  [place] [address]  HISTORICAL INFORMATION:   Selected notes from the MEDICAL RECORD NUMBER Referred by Dr. Krista Blue for concern of BRVO   CURRENT MEDICATIONS: No current outpatient medications on file. (Ophthalmic Drugs)   No current facility-administered medications for this visit. (Ophthalmic Drugs)   Current Outpatient Medications (Other)  Medication Sig   omeprazole (PRILOSEC) 20 MG capsule Take 20 mg by mouth daily.   No current facility-administered medications for this visit. (Other)   REVIEW OF SYSTEMS:   ALLERGIES No Known Allergies  PAST MEDICAL HISTORY Past Medical History:  Diagnosis Date   Cataract    OU   Hypertensive retinopathy    OU   Past Surgical History:  Procedure Laterality Date   LAPAROSCOPIC TUBAL LIGATION  2003   FAMILY HISTORY Family History  Problem  Relation Age of Onset   Diabetes Mother    Kidney failure Father    SOCIAL HISTORY Social History   Tobacco Use   Smoking status: Never   Smokeless tobacco: Never  Vaping Use   Vaping Use: Never used  Substance Use Topics   Alcohol use: No   Drug use: No       OPHTHALMIC EXAM:  Base Eye Exam     Visual Acuity (Snellen - Linear)       Right Left   Dist Protivin 20/25 +2    Dist cc  20/25 +1         Tonometry (Tonopen, 8:21 AM)       Right Left   Pressure 16 19         Pupils       Dark Light Shape React APD   Right 4 3 Round Brisk None   Left 4 3 Round Brisk None         Visual Fields       Left Right    Full Full         Extraocular Movement       Right Left    Full, Ortho Full, Ortho         Neuro/Psych     Oriented x3: Yes   Mood/Affect: Normal         Dilation  Both eyes: 1.0% Mydriacyl, 2.5% Phenylephrine @ 8:17 AM           Slit Lamp and Fundus Exam     Slit Lamp Exam       Right Left   Lids/Lashes Dermatochalasis - upper lid Normal   Conjunctiva/Sclera Nasal and temporal Pinguecula, Melanosis Nasal and Pinguecula, Melanosis   Cornea well healed cataract wound, tear film debris trace Punctate epithelial erosions   Anterior Chamber Deep, 0.5+cell Deep and clear   Iris Round and dilated, No NVI Round and dilated, No NVI   Lens PC IOL in good position, trace Posterior capsular opacification 2+ Nuclear sclerosis, 2-3+ Cortical cataract   Anterior Vitreous mild Vitreous syneresis, Posterior vitreous detachment mild Vitreous syneresis         Fundus Exam       Right Left   Disc 2+ mild Pallor, +PPP Pink and Sharp, temporal PPP, no heme   C/D Ratio 0.5 0.5   Macula Blunted foveal reflex, central edema with SRF, focal circinate exudates SN macula, +IRH Flat, Good foveal reflex, RPE mottling and clumping, No heme or edema, geographic area of hypopigmented RPE nasal and superior to fovea -- stable from prior   Vessels  attenuated, Tortuous, +sheathing ST arcades, +BRVO attenuated, Tortuous   Periphery Attached, DBH temporal periphery -- extension of BRVO, bullous schisis cavity IT periphery (from 0700-0900) - stable; focal pigmented CR scar at 0130 midzone, no RT/RD. WWP temporally Attached, no heme, WWP temporal and inferior quadrants, shallow schisis IT periphery           Refraction     Wearing Rx       Sphere Cylinder Axis Add   Right -4.50 +1.00 167 +2.25   Left -4.00 +0.50 015 +2.25           IMAGING AND PROCEDURES  Imaging and Procedures for @TODAY @  OCT, Retina - OU - Both Eyes       Right Eye Quality was good. Central Foveal Thickness: 773. Progression has worsened. Findings include abnormal foveal contour, intraretinal hyper-reflective material, intraretinal fluid, subretinal fluid (Severe CME with central SRF).   Left Eye Quality was good. Central Foveal Thickness: 233. Progression has been stable. Findings include normal foveal contour, no IRF, no SRF (Partial PVD, Shallow IT Retinoschisis).   Notes *Images captured and stored on drive  Diagnosis / Impression:  OD: superior BRVO with CME--interval redevelopment of severe CME with central SRF OS: NFP, no IRF/SRF; Partial PVD, shallow IT retinoschisis   Clinical management:  See below  Abbreviations: NFP - Normal foveal profile. CME - cystoid macular edema. PED - pigment epithelial detachment. IRF - intraretinal fluid. SRF - subretinal fluid. EZ - ellipsoid zone. ERM - epiretinal membrane. ORA - outer retinal atrophy. ORT - outer retinal tubulation. SRHM - subretinal hyper-reflective material      Intravitreal Injection, Pharmacologic Agent - OD - Right Eye       Time Out 04/25/2022. 9:16 AM. Confirmed correct patient, procedure, site, and patient consented.   Anesthesia Topical anesthesia was used. Anesthetic medications included Lidocaine 2%, Proparacaine 0.5%.   Procedure Preparation included 5% betadine to  ocular surface, eyelid speculum. A (32g) needle was used.   Injection: 2 mg aflibercept 2 MG/0.05ML   Route: Intravitreal, Site: Right Eye   NDC: 04/27/2022, Lot: [id number], Expiration date: 07/25/2023, Waste: 0 mL   Post-op Post injection exam found visual acuity of at least counting fingers. The patient tolerated the procedure well. There were no complications. The  patient received written and verbal post procedure care education. Post injection medications were not given.   Notes             ASSESSMENT/PLAN:    ICD-10-CM   1. 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 Retinal artery branch occlusion of right eye  H34.231     3. Essential hypertension  I10     4. Hypertensive retinopathy of both eyes  H35.033     5. Combined forms of age-related cataract of left eye  H25.812     6. 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 BRVO with CME OD  - pt has been lost to follow up to 16 wks instead of 9 (08.10.23-11.30.23)  - s/p IVA OD #1 (10.05.20), #2 (11.02.20), #3 (11.30.20), #4 (01.07.21), #5 (02.05.21), #6 (03.05.21), #7 (04.02.21), #8 (05.04.21), #9 (06.03.21), #10 (07.22.21), #11 (08.19.21), #12 (09.16.21) -- IVA resistance  - s/p IVE OD #1 (10.14.21), #2 (11.11.21), #3 (12.09.21), #4 (01.13.22), #5 (2.10.22), #6 (04.05.22), #7 (05.26.22), #8 (07.21.22), #9 (09.29.22), #10 (12.01.22), #11 (02.02.23), #12 (04.06.23), #13 (06.08.23), #14 (08.10.23)  - **history of increased IRF at 10 wk interval, noted on 9.29.22 visit**  - initial exam and OCT findings suggestive of a BRAO component contributing  - BCVA OD 20/25 -- improved (s/p CEIOL OD)  - exam shows interval redevelopment of macular edema in superior macula  - OCT shows interval redevelopment of severe CME with central SRF at 16  weeks  - recommend IVE OD #15 today, 11.30.23 for BRVO w/ CME with follow up in 4 weeks  - pt wishes to proceed  - RBA of procedure discussed, questions answered  - informed consent obtained  - see procedure note  - Avastin informed consent obtained and re-signed on 06.08.23  - [employer] benefits investigation started 9.16.21 -- approved for 2023 (Good Days Copay card)  - F/U 4 weeks -- DFE/OCT/possible injection, widefield OCT through schisis  3,4. Hypertensive retinopathy OU  - pt not formally diagnosed with HTN  - discussed importance of tight BP control and its relation to problems #1-2 above  - advised discussion with PCP and possible calibration of home BP cuff with PCP's  - monitor   5. Mixed form age related cataract OS  - The symptoms of cataract, surgical options, and treatments and risks were discussed with patient.  - discussed diagnosis and progression  - under the expert management of Dr. Zenaida NieceVan  6. Pseudophakia OD  - s/p CE/IOL (Dr. Zenaida NieceVan, 10.18.23)  - IOL in good position  - post - op CME + BRVO w/ CME -- currently on Pred and Ketorolac QID OD -- continue  - monitor  7. Retinoschisis OU  - shallow schisis inferotemporal periphery OU -- stable  - stable on widefield OCT  - no RT/RD on scleral depression  - pt asymptomatic  - monitor   Ophthalmic Meds Ordered this visit:  Meds ordered this encounter  Medications   aflibercept (EYLEA) SOLN 2 mg     Return in about 4 weeks (around 05/23/2022) for f/u BRVO OD, DFE, OCT.  There are no Patient Instructions on file for this visit.   Explained the diagnoses, plan, and follow up with the patient and they  expressed understanding.  Patient expressed understanding of the importance of proper follow up care.   This document serves as a record of services personally performed by Karie Chimera, MD, PhD. It was created on their behalf by Glee Arvin. Manson Passey, OA an ophthalmic technician. The creation of this record is the  provider's dictation and/or activities during the visit.    Electronically signed by: Glee Arvin. Manson Passey, [place] 11.16.2023 11:16 AM  Karie Chimera, M.D., Ph.D. Diseases & Surgery of the Retina and Vitreous [facility]  I have reviewed the above documentation for accuracy and completeness, and I agree with the above. Karie Chimera, M.D., Ph.D. 04/25/22 1:46 PM    Abbreviations: M myopia (nearsighted); A astigmatism; H hyperopia (farsighted); P presbyopia; Mrx spectacle prescription;  CTL contact lenses; OD right eye; OS left eye; OU both eyes  XT exotropia; ET esotropia; PEK punctate epithelial keratitis; PEE punctate epithelial erosions; DES dry eye syndrome; MGD meibomian gland dysfunction; ATs artificial tears; PFAT's preservative free artificial tears; NSC nuclear sclerotic cataract; PSC posterior subcapsular cataract; ERM epi-retinal membrane; PVD posterior vitreous detachment; RD retinal detachment; DM diabetes mellitus; DR diabetic retinopathy; NPDR non-proliferative diabetic retinopathy; PDR proliferative diabetic retinopathy; CSME clinically significant macular edema; DME diabetic macular edema; dbh dot blot hemorrhages; CWS cotton wool spot; POAG primary open angle glaucoma; C/D cup-to-disc ratio; HVF humphrey visual field; GVF goldmann visual field; OCT optical coherence tomography; IOP intraocular pressure; BRVO Branch retinal vein occlusion; CRVO central retinal vein occlusion; CRAO central retinal artery occlusion; BRAO branch retinal artery occlusion; RT retinal tear; SB scleral buckle; PPV pars plana vitrectomy; VH Vitreous hemorrhage; PRP panretinal laser photocoagulation; IVK intravitreal kenalog; VMT vitreomacular traction; MH Macular hole;  NVD neovascularization of the disc; NVE neovascularization elsewhere; AREDS age related eye disease study; ARMD age related macular degeneration; POAG primary open angle glaucoma; EBMD epithelial/anterior basement membrane  dystrophy; ACIOL anterior chamber intraocular lens; IOL intraocular lens; PCIOL posterior chamber intraocular lens; Phaco/IOL phacoemulsification with intraocular lens placement; PRK photorefractive keratectomy; LASIK laser assisted in situ keratomileusis; HTN hypertension; DM diabetes mellitus; COPD chronic obstructive pulmonary disease

## 2022-04-25 ENCOUNTER — Encounter (INDEPENDENT_AMBULATORY_CARE_PROVIDER_SITE_OTHER): Payer: Self-pay | Admitting: Ophthalmology

## 2022-04-25 ENCOUNTER — Ambulatory Visit (INDEPENDENT_AMBULATORY_CARE_PROVIDER_SITE_OTHER): Payer: 59 | Admitting: Ophthalmology

## 2022-04-25 DIAGNOSIS — H34231 Retinal artery branch occlusion, right eye: Secondary | ICD-10-CM

## 2022-04-25 DIAGNOSIS — Z961 Presence of intraocular lens: Secondary | ICD-10-CM

## 2022-04-25 DIAGNOSIS — H35033 Hypertensive retinopathy, bilateral: Secondary | ICD-10-CM | POA: Diagnosis not present

## 2022-04-25 DIAGNOSIS — H33103 Unspecified retinoschisis, bilateral: Secondary | ICD-10-CM

## 2022-04-25 DIAGNOSIS — H34831 Tributary (branch) retinal vein occlusion, right eye, with macular edema: Secondary | ICD-10-CM | POA: Diagnosis not present

## 2022-04-25 DIAGNOSIS — I10 Essential (primary) hypertension: Secondary | ICD-10-CM

## 2022-04-25 DIAGNOSIS — H25813 Combined forms of age-related cataract, bilateral: Secondary | ICD-10-CM

## 2022-04-25 DIAGNOSIS — H25812 Combined forms of age-related cataract, left eye: Secondary | ICD-10-CM

## 2022-04-25 MED ORDER — AFLIBERCEPT 2MG/0.05ML IZ SOLN FOR KALEIDOSCOPE
2.0000 mg | INTRAVITREAL | Status: AC | PRN
  Administered 2022-04-25: 2 mg via INTRAVITREAL

## 2022-05-13 NOTE — Progress Notes (Signed)
[facility] - Clinic Note  05/23/2022     CHIEF COMPLAINT Patient presents for Retina Follow Up   HISTORY OF PRESENT ILLNESS: Jacqueline Greer is a 61 y.o. female who presents to the clinic today for:   HPI     Retina Follow Up   Patient presents with  CRVO/BRVO (IVE OD 06.08.23).  In right eye.  This started months ago.  Severity is moderate.  Duration of 4 weeks.  Since onset it is stable.  I, the attending physician,  performed the HPI with the patient and updated documentation appropriately.        Comments   Patient states that the vision may have changed. She feels that the vision today is not as clear as yesterday. She is not using any eye drops at this time.      Last edited by Rennis Chris, MD on 05/23/2022 12:38 PM.     Referring physician: Luciano Cutter, PA-C [address] [place] ,  [place] [address]  HISTORICAL INFORMATION:   Selected notes from the MEDICAL RECORD NUMBER Referred by Dr. Krista Blue for concern of BRVO   CURRENT MEDICATIONS: No current outpatient medications on file. (Ophthalmic Drugs)   No current facility-administered medications for this visit. (Ophthalmic Drugs)   Current Outpatient Medications (Other)  Medication Sig   omeprazole (PRILOSEC) 20 MG capsule Take 20 mg by mouth daily.   No current facility-administered medications for this visit. (Other)   REVIEW OF SYSTEMS: ROS   Positive for: Eyes Negative for: Constitutional, Gastrointestinal, Neurological, Skin, Genitourinary, Musculoskeletal, HENT, Endocrine, Cardiovascular, Respiratory, Psychiatric, Allergic/Imm, Heme/Lymph Last edited by Julieanne Cotton, COT on 05/23/2022  8:27 AM.      ALLERGIES No Known Allergies  PAST MEDICAL HISTORY Past Medical History:  Diagnosis Date   Cataract    OU   Hypertensive retinopathy    OU   Past Surgical History:  Procedure Laterality Date   LAPAROSCOPIC TUBAL LIGATION  2003   FAMILY  HISTORY Family History  Problem Relation Age of Onset   Diabetes Mother    Kidney failure Father    SOCIAL HISTORY Social History   Tobacco Use   Smoking status: Never   Smokeless tobacco: Never  Vaping Use   Vaping Use: Never used  Substance Use Topics   Alcohol use: No   Drug use: No       OPHTHALMIC EXAM:  Base Eye Exam     Visual Acuity (Snellen - Linear)       Right Left   Dist cc 20/25 20/25 +1    Correction: Glasses         Tonometry (Tonopen, 8:31 AM)       Right Left   Pressure 18 18         Pupils       Dark Light Shape React APD   Right 4 3 Round Brisk None   Left 4 3 Round Brisk None         Visual Fields       Left Right    Full Full         Extraocular Movement       Right Left    Full, Ortho Full, Ortho         Neuro/Psych     Oriented x3: Yes   Mood/Affect: Normal         Dilation     Both eyes: 1.0% Mydriacyl, 2.5% Phenylephrine @ 8:27 AM  Slit Lamp and Fundus Exam     Slit Lamp Exam       Right Left   Lids/Lashes Dermatochalasis - upper lid Normal   Conjunctiva/Sclera Nasal and temporal Pinguecula, Melanosis Nasal and Pinguecula, Melanosis   Cornea well healed cataract wound, tear film debris trace Punctate epithelial erosions   Anterior Chamber Deep, 0.5+cell Deep and clear   Iris Round and dilated, No NVI Round and dilated, No NVI   Lens PC IOL in good position, trace Posterior capsular opacification 2+ Nuclear sclerosis, 2-3+ Cortical cataract   Anterior Vitreous mild Vitreous syneresis, Posterior vitreous detachment mild Vitreous syneresis         Fundus Exam       Right Left   Disc 2+ mild Pallor, +PPP Pink and Sharp, temporal PPP, no heme   C/D Ratio 0.5 0.5   Macula Blunted foveal reflex, central edema with SRF -- massively improved, focal circinate exudates SN and temporal macula, +IRH Flat, Good foveal reflex, RPE mottling and clumping, No heme or edema, geographic area of  hypopigmented RPE nasal and superior to fovea -- stable from prior   Vessels attenuated, Tortuous, +sheathing ST arcades, +BRVO attenuated, Tortuous   Periphery Attached, DBH temporal periphery -- extension of BRVO, bullous schisis cavity IT periphery (from 0700-0900) - stable; focal pigmented CR scar at 0130 midzone, no RT/RD. WWP temporally Attached, no heme, WWP temporal and inferior quadrants, shallow schisis IT periphery           Refraction     Wearing Rx       Sphere Cylinder Axis Add   Right -4.50 +1.00 167 +2.25   Left -4.00 +0.50 015 +2.25           IMAGING AND PROCEDURES  Imaging and Procedures for @TODAY @  OCT, Retina - OU - Both Eyes       Right Eye Quality was good. Central Foveal Thickness: 209. Progression has improved. Findings include normal foveal contour, no IRF, intraretinal hyper-reflective material, subretinal fluid (Massive Interval improvement in severe CME with central SRF -- just trace SRF remains).   Left Eye Quality was good. Central Foveal Thickness: 233. Progression has been stable. Findings include normal foveal contour, no IRF, no SRF (Partial PVD, Shallow IT Retinoschisis).   Notes *Images captured and stored on drive  Diagnosis / Impression:  OD: Massive Interval improvement in severe CME with central SRF -- just trace SRF remains OS: NFP, no IRF/SRF; Partial PVD, shallow IT retinoschisis   Clinical management:  See below  Abbreviations: NFP - Normal foveal profile. CME - cystoid macular edema. PED - pigment epithelial detachment. IRF - intraretinal fluid. SRF - subretinal fluid. EZ - ellipsoid zone. ERM - epiretinal membrane. ORA - outer retinal atrophy. ORT - outer retinal tubulation. SRHM - subretinal hyper-reflective material      Intravitreal Injection, Pharmacologic Agent - OD - Right Eye       Time Out 05/23/2022. 9:33 AM. Confirmed correct patient, procedure, site, and patient consented.   Anesthesia Topical anesthesia  was used. Anesthetic medications included Lidocaine 2%, Proparacaine 0.5%.   Procedure Preparation included 5% betadine to ocular surface, eyelid speculum. A (32g) needle was used.   Injection: 2 mg aflibercept 2 MG/0.05ML   Route: Intravitreal, Site: Right Eye   NDC: 05/25/2022, Lot: [id number], Expiration date: 07/25/2023, Waste: 0 mL   Post-op Post injection exam found visual acuity of at least counting fingers. The patient tolerated the procedure well. There were no complications. The patient received  written and verbal post procedure care education. Post injection medications were not given.   Notes             ASSESSMENT/PLAN:    ICD-10-CM   1. 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 Retinal artery branch occlusion of right eye  H34.231     3. Essential hypertension  I10     4. Hypertensive retinopathy of both eyes  H35.033     5. Combined forms of age-related cataract of left eye  H25.812     6. 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 BRVO with CME OD  - pt has been lost to follow up to 16 wks instead of 9 (08.10.23-11.30.23)  - s/p IVA OD #1 (10.05.20), #2 (11.02.20), #3 (11.30.20), #4 (01.07.21), #5 (02.05.21), #6 (03.05.21), #7 (04.02.21), #8 (05.04.21), #9 (06.03.21), #10 (07.22.21), #11 (08.19.21), #12 (09.16.21) -- IVA resistance  - s/p IVE OD #1 (10.14.21), #2 (11.11.21), #3 (12.09.21), #4 (01.13.22), #5 (2.10.22), #6 (04.05.22), #7 (05.26.22), #8 (07.21.22), #9 (09.29.22), #10 (12.01.22), #11 (02.02.23), #12 (04.06.23), #13 (06.08.23), #14 (08.10.23), #15 (11.30.23)  - **history of increased IRF at 10 wk interval, noted on 9.29.22 visit and 16 wks on 11.30.23 visit**  - initial exam and OCT findings suggestive of a BRAO component contributing  - BCVA OD 20/25 --  stably improved s/p CEIOL OD  - exam shows interval improvement in macular edema superior macula  - OCT shows Massive Interval improvement in severe CME with central SRF -- just trace SRF remains at 4 weeks  - recommend IVE OD #16 today, 12.28.23 for BRVO w/ CME with ext follow up to 6 weeks  - pt wishes to proceed  - RBA of procedure discussed, questions answered  - informed consent obtained  - see procedure note  - Avastin informed consent obtained and re-signed on 06.08.23  - Eylea[employer] benefits investigation started 9.16.21 -- approved for 2023 (Good Days Copay card)  - F/U 6 weeks -- DFE/OCT/possible injection, widefield OCT through schisis  3,4. Hypertensive retinopathy OU  - pt not formally diagnosed with HTN  - discussed importance of tight BP control and its relation to problems #1-2 above  - advised discussion with PCP and possible calibration of home BP cuff with PCP's  - monitor   5. Mixed form age related cataract OS  - The symptoms of cataract, surgical options, and treatments and risks were discussed with patient.  - discussed diagnosis and progression  - under the expert management of Dr. Zenaida Niece  6. Pseudophakia OD  - s/p CE/IOL (Dr. Zenaida Niece, 10.18.23)  - IOL in good position  - post - op CME + BRVO w/ CME -- currently on Pred and Ketorolac QID OD -- continue  - monitor  7. Retinoschisis OU  - shallow schisis inferotemporal periphery OU -- stable  - stable on widefield OCT  - no RT/RD on scleral depression  - pt asymptomatic  - monitor   Ophthalmic Meds Ordered this visit:  Meds ordered this encounter  Medications   aflibercept (EYLEA) SOLN 2 mg     Return in about 6 weeks (around 07/04/2022) for f/u BRVO OD, DFE, OCT.  There are no Patient Instructions on file for this visit.  Explained the diagnoses, plan, and follow up with the patient and they expressed understanding.  Patient expressed understanding of the importance of proper follow up care.   This document  serves as a record of services personally performed by Karie ChimeraBrian G. Domino Holten, MD, PhD. It was created on their behalf by Glee ArvinAmanda J. Manson PasseyBrown, OA an ophthalmic technician. The creation of this record is the provider's dictation and/or activities during the visit.    Electronically signed by: Glee ArvinAmanda J. Manson PasseyBrown, [place] 12.18.2023 2:13 AM  Karie ChimeraBrian G. Paulett Kaufhold, M.D., Ph.D. Diseases & Surgery of the Retina and Vitreous [facility]  I have reviewed the above documentation for accuracy and completeness, and I agree with the above. Karie ChimeraBrian G. Amauria Younts, M.D., Ph.D. 05/24/22 2:13 AM  Abbreviations: M myopia (nearsighted); A astigmatism; H hyperopia (farsighted); P presbyopia; Mrx spectacle prescription;  CTL contact lenses; OD right eye; OS left eye; OU both eyes  XT exotropia; ET esotropia; PEK punctate epithelial keratitis; PEE punctate epithelial erosions; DES dry eye syndrome; MGD meibomian gland dysfunction; ATs artificial tears; PFAT's preservative free artificial tears; NSC nuclear sclerotic cataract; PSC posterior subcapsular cataract; ERM epi-retinal membrane; PVD posterior vitreous detachment; RD retinal detachment; DM diabetes mellitus; DR diabetic retinopathy; NPDR non-proliferative diabetic retinopathy; PDR proliferative diabetic retinopathy; CSME clinically significant macular edema; DME diabetic macular edema; dbh dot blot hemorrhages; CWS cotton wool spot; POAG primary open angle glaucoma; C/D cup-to-disc ratio; HVF humphrey visual field; GVF goldmann visual field; OCT optical coherence tomography; IOP intraocular pressure; BRVO Branch retinal vein occlusion; CRVO central retinal vein occlusion; CRAO central retinal artery occlusion; BRAO branch retinal artery occlusion; RT retinal tear; SB scleral buckle; PPV pars plana vitrectomy; VH Vitreous hemorrhage; PRP panretinal laser photocoagulation; IVK intravitreal kenalog; VMT vitreomacular traction; MH Macular hole;  NVD neovascularization of the disc;  NVE neovascularization elsewhere; AREDS age related eye disease study; ARMD age related macular degeneration; POAG primary open angle glaucoma; EBMD epithelial/anterior basement membrane dystrophy; ACIOL anterior chamber intraocular lens; IOL intraocular lens; PCIOL posterior chamber intraocular lens; Phaco/IOL phacoemulsification with intraocular lens placement; PRK photorefractive keratectomy; LASIK laser assisted in situ keratomileusis; HTN hypertension; DM diabetes mellitus; COPD chronic obstructive pulmonary disease

## 2022-05-23 ENCOUNTER — Encounter (INDEPENDENT_AMBULATORY_CARE_PROVIDER_SITE_OTHER): Payer: Self-pay | Admitting: Ophthalmology

## 2022-05-23 ENCOUNTER — Ambulatory Visit (INDEPENDENT_AMBULATORY_CARE_PROVIDER_SITE_OTHER): Payer: 59 | Admitting: Ophthalmology

## 2022-05-23 DIAGNOSIS — I10 Essential (primary) hypertension: Secondary | ICD-10-CM

## 2022-05-23 DIAGNOSIS — H34231 Retinal artery branch occlusion, right eye: Secondary | ICD-10-CM

## 2022-05-23 DIAGNOSIS — H35033 Hypertensive retinopathy, bilateral: Secondary | ICD-10-CM | POA: Diagnosis not present

## 2022-05-23 DIAGNOSIS — Z961 Presence of intraocular lens: Secondary | ICD-10-CM

## 2022-05-23 DIAGNOSIS — H25812 Combined forms of age-related cataract, left eye: Secondary | ICD-10-CM

## 2022-05-23 DIAGNOSIS — H34831 Tributary (branch) retinal vein occlusion, right eye, with macular edema: Secondary | ICD-10-CM

## 2022-05-23 DIAGNOSIS — H33103 Unspecified retinoschisis, bilateral: Secondary | ICD-10-CM

## 2022-05-23 MED ORDER — AFLIBERCEPT 2MG/0.05ML IZ SOLN FOR KALEIDOSCOPE
2.0000 mg | INTRAVITREAL | Status: AC | PRN
  Administered 2022-05-23: 2 mg via INTRAVITREAL

## 2022-07-04 NOTE — Progress Notes (Signed)
[facility] Note  07/18/2022     CHIEF COMPLAINT Patient presents for Retina Follow Up   HISTORY OF PRESENT ILLNESS: Jacqueline Greer is a 62 y.o. female who presents to the clinic today for:   HPI     Retina Follow Up   Patient presents with  CRVO/BRVO.  In right eye.  Severity is moderate.  Duration of 8 weeks.  Since onset it is stable.  I, the attending physician,  performed the HPI with the patient and updated documentation appropriately.        Comments   Pt here for 8 wk ret f/u (delayed from 6 wks) for BRVO OD. Pt states VA the same, stable no changes.       Last edited by Bernarda Caffey, MD on 07/18/2022  9:06 AM.    Pt states vision is stable  Referring physician: Julian Hy, PA-C [place] ,   [id number]  HISTORICAL INFORMATION:   Selected notes from the MEDICAL RECORD NUMBER Referred by Dr. Parke Simmers for concern of BRVO   CURRENT MEDICATIONS: No current outpatient medications on file. (Ophthalmic Drugs)   No current facility-administered medications for this visit. (Ophthalmic Drugs)   Current Outpatient Medications (Other)  Medication Sig   omeprazole (PRILOSEC) 20 MG capsule Take 20 mg by mouth daily.   No current facility-administered medications for this visit. (Other)   REVIEW OF SYSTEMS: ROS   Positive for: Eyes Negative for: Constitutional, Gastrointestinal, Neurological, Skin, Genitourinary, Musculoskeletal, HENT, Endocrine, Cardiovascular, Respiratory, Psychiatric, Allergic/Imm, Heme/Lymph Last edited by Kingsley Spittle, COT on 07/18/2022  8:30 AM.     ALLERGIES No Known Allergies  PAST MEDICAL HISTORY Past Medical History:  Diagnosis Date   Cataract    OU   Hypertensive retinopathy    OU   Past Surgical History:  Procedure Laterality Date   LAPAROSCOPIC TUBAL LIGATION  2003   FAMILY HISTORY Family History  Problem Relation Age of Onset   Diabetes Mother    Kidney  failure Father    SOCIAL HISTORY Social History   Tobacco Use   Smoking status: Never   Smokeless tobacco: Never  Vaping Use   Vaping Use: Never used  Substance Use Topics   Alcohol use: No   Drug use: No       OPHTHALMIC EXAM:  Base Eye Exam     Visual Acuity (Snellen - Linear)       Right Left   Dist Hilbert 20/25    Dist cc  20/30 +1   Dist ph cc  20/25 +2    Correction: Glasses  Pseudophakia OD, w/o CC MS        Tonometry (Tonopen, 8:37 AM)       Right Left   Pressure 15 14         Pupils       Pupils Dark Light Shape React APD   Right PERRL 4 3 Round Brisk None   Left PERRL 4 3 Round Brisk None         Visual Fields (Counting fingers)       Left Right    Full Full         Extraocular Movement       Right Left    Full, Ortho Full, Ortho         Neuro/Psych     Oriented x3: Yes   Mood/Affect: Normal         Dilation  Both eyes: 1.0% Mydriacyl, 2.5% Phenylephrine @ 8:37 AM           Slit Lamp and Fundus Exam     Slit Lamp Exam       Right Left   Lids/Lashes Dermatochalasis - upper lid Normal   Conjunctiva/Sclera Nasal and temporal Pinguecula, Melanosis Nasal and Pinguecula, Melanosis   Cornea well healed cataract wound, tear film debris, 2+ Punctate epithelial erosions trace Punctate epithelial erosions, trace tear film debris   Anterior Chamber deep and clear, no cell or flare Deep and clear   Iris Round and dilated, No NVI Round and dilated, No NVI   Lens PC IOL in good position, trace Posterior capsular opacification 2+ Nuclear sclerosis, 2-3+ Cortical cataract   Anterior Vitreous mild Vitreous syneresis, Posterior vitreous detachment mild Vitreous syneresis         Fundus Exam       Right Left   Disc 2+Pallor, +PPP Pink and Sharp, temporal PPP, no heme   C/D Ratio 0.5 0.5   Macula good foveal reflex, stable improvement in central edema, focal IRH with edema and exudates SN macula, focal cluster or IRH and  exudates temporal mac Flat, Good foveal reflex, RPE mottling and clumping, No heme or edema, geographic area of hypopigmented RPE nasal and superior to fovea -- stable from prior   Vessels attenuated, Tortuous, mild copper wiring, mild AV crossing changes attenuated, Tortuous   Periphery Attached, DBH temporal periphery -- extension of BRVO, bullous schisis cavity IT periphery (from 0700-0900) - stable; focal pigmented CR scar at 0130 midzone, no RT/RD, WWP temporally, pigmented cystoid degeneration inferiorly Attached, no heme, WWP temporal and inferior quadrants, shallow schisis IT periphery           Refraction     Wearing Rx       Sphere Cylinder Axis Add   Right       Left -4.00 +0.50 015 +2.25           IMAGING AND PROCEDURES  Imaging and Procedures for @TODAY$ @  OCT, Retina - OU - Both Eyes       Right Eye Quality was good. Central Foveal Thickness: 227. Progression has improved. Findings include normal foveal contour, no IRF, no SRF, intraretinal hyper-reflective material (Stable improvement in central IRF, interval resolution of SRF centrally, focal pocket of IRF/IRHM SN macula).   Left Eye Quality was good. Central Foveal Thickness: 233. Progression has been stable. Findings include normal foveal contour, no IRF, no SRF (Partial PVD, Shallow IT Retinoschisis).   Notes *Images captured and stored on drive  Diagnosis / Impression:  OD: Stable improvement in central IRF, interval resolution of SRF centrally, focal pocket of IRF/IRHM SN macula OS: NFP, no IRF/SRF; Partial PVD, shallow IT retinoschisis   Clinical management:  See below  Abbreviations: NFP - Normal foveal profile. CME - cystoid macular edema. PED - pigment epithelial detachment. IRF - intraretinal fluid. SRF - subretinal fluid. EZ - ellipsoid zone. ERM - epiretinal membrane. ORA - outer retinal atrophy. ORT - outer retinal tubulation. SRHM - subretinal hyper-reflective material      Intravitreal  Injection, Pharmacologic Agent - OD - Right Eye       Time Out 07/18/2022. 9:10 AM. Confirmed correct patient, procedure, site, and patient consented.   Anesthesia Topical anesthesia was used. Anesthetic medications included Lidocaine 2%, Proparacaine 0.5%.   Procedure Preparation included 5% betadine to ocular surface, eyelid speculum. A (32g) needle was used.   Injection: 2 mg aflibercept 2  MG/0.05ML   Route: Intravitreal, Site: Right Eye   NDC: A3590391, Lot: [id number], Expiration date: 04/26/2023, Waste: 0 mL   Post-op Post injection exam found visual acuity of at least counting fingers. The patient tolerated the procedure well. There were no complications. The patient received written and verbal post procedure care education. Post injection medications were not given.   Notes             ASSESSMENT/PLAN:   ICD-10-CM   1. 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 Retinal artery branch occlusion of right eye  H34.231     3. Essential hypertension  I10     4. Hypertensive retinopathy of both eyes  H35.033     5. Combined forms of age-related cataract of left eye  H25.812     6. 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 BRVO with CME OD  - delayed f/u - 8 wks instead of 6  - lost to follow up to 16 wks instead of 9 (08.10.23-11.30.23)  - lost to follow up from 6 weeks to 8 weeks 912.28.23 - 02.22.24)  - s/p IVA OD #1 (10.05.20), #2 (11.02.20), #3 (11.30.20), #4 (01.07.21), #5 (02.05.21), #6 (03.05.21), #7 (04.02.21), #8 (05.04.21), #9 (06.03.21), #10 (07.22.21), #11 (08.19.21), #12 (09.16.21) -- IVA resistance  - s/p IVE OD #1 (10.14.21), #2 (11.11.21), #3 (12.09.21), #4 (01.13.22), #5 (2.10.22), #6 (04.05.22), #7 (05.26.22), #8 (07.21.22), #9 (09.29.22), #10 (12.01.22),  #11 (02.02.23), #12 (04.06.23), #13 (06.08.23), #14 (08.10.23), #15 (11.30.23), #16 (12.28.23)  - **history of increased IRF at 10 wk interval, noted on 9.29.22 visit and 16 wks on 11.30.23 visit**  - initial exam and OCT findings suggestive of a BRAO component contributing  - BCVA OD 20/25 -- stably improved s/p CEIOL OD  - exam shows interval improvement in macular edema superior macula  - OCT shows Stable improvement in central IRF, interval resolution of SRF centrally, focal pocket of IRF/IRHM SN macula at 8 weeks  - recommend IVE OD #17 today, 02.22.24 for BRVO w/ CME with f/u back to 6 weeks  - pt wishes to proceed  - RBA of procedure discussed, questions answered  - informed consent obtained  - see procedure note  - Avastin informed consent obtained and re-signed on 06.08.23  - Eylea[employer]U benefits investigation started 9.16.21 -- approved for 2023 (Good Days Copay card)  - F/U 6 weeks -- DFE/OCT/possible injection, widefield OCT through schisis  3,4. Hypertensive retinopathy OU  - pt not formally diagnosed with HTN  - discussed importance of tight BP control and its relation to problems #1-2 above  - advised discussion with PCP and possible calibration of home BP cuff with PCP's  - monitor   5. Mixed form age related cataract OS  - The symptoms of cataract, surgical options, and treatments and risks were discussed with patient.  - discussed diagnosis and progression  - under the expert management of Dr. Lucianne Lei  6. Pseudophakia OD  - s/p CE/IOL (Dr. Lucianne Lei, 10.18.23)  - IOL in good position  - post - op CME + BRVO w/ CME -- currently on Pred and Ketorolac QID OD -- continue  - monitor  7. Retinoschisis OU  - shallow schisis inferotemporal periphery OU --  stable  - stable on widefield OCT  - no RT/RD on scleral depression  - pt asymptomatic  - monitor   Ophthalmic Meds Ordered this visit:  Meds ordered this encounter  Medications   aflibercept (EYLEA) SOLN 2 mg     Return in  about 6 weeks (around 08/29/2022) for f/u BRVO OD, DFE, OCT.  There are no Patient Instructions on file for this visit.   Explained the diagnoses, plan, and follow up with the patient and they expressed understanding.  Patient expressed understanding of the importance of proper follow up care.   This document serves as a record of services personally performed by Gardiner Sleeper, MD, PhD. It was created on their behalf by San Jetty. Owens Shark, OA an ophthalmic technician. The creation of this record is the provider's dictation and/or activities during the visit.    Electronically signed by: San Jetty. Owens Shark, [place] 02.08.2024 1:22 PM  Gardiner Sleeper, M.D., Ph.D. Diseases & Surgery of the Retina and Vitreous [facility]  I have reviewed the above documentation for accuracy and completeness, and I agree with the above. Gardiner Sleeper, M.D., Ph.D. 07/18/22 1:24 PM   Abbreviations: M myopia (nearsighted); A astigmatism; H hyperopia (farsighted); P presbyopia; Mrx spectacle prescription;  CTL contact lenses; OD right eye; OS left eye; OU both eyes  XT exotropia; ET esotropia; PEK punctate epithelial keratitis; PEE punctate epithelial erosions; DES dry eye syndrome; MGD meibomian gland dysfunction; ATs artificial tears; PFAT's preservative free artificial tears; [place]ett nuclear sclerotic cataract; PSC posterior subcapsular cataract; ERM epi-retinal membrane; PVD posterior vitreous detachment; RD retinal detachment; DM diabetes mellitus; DR diabetic retinopathy; NPDR non-proliferative diabetic retinopathy; PDR proliferative diabetic retinopathy; CSME clinically significant macular edema; DME diabetic macular edema; dbh dot blot hemorrhages; CWS cotton wool spot; POAG primary open angle glaucoma; C/D cup-to-disc ratio; HVF humphrey visual field; GVF goldmann visual field; OCT optical coherence tomography; IOP intraocular pressure; BRVO Branch retinal vein occlusion; CRVO central retinal vein  occlusion; CRAO central retinal artery occlusion; BRAO branch retinal artery occlusion; RT retinal tear; SB scleral buckle; PPV pars plana vitrectomy; VH Vitreous hemorrhage; PRP panretinal laser photocoagulation; IVK intravitreal kenalog; VMT vitreomacular traction; MH Macular hole;  NVD neovascularization of the disc; NVE neovascularization elsewhere; AREDS age related eye disease study; ARMD age related macular degeneration; POAG primary open angle glaucoma; EBMD epithelial/anterior basement membrane dystrophy; ACIOL anterior chamber intraocular lens; IOL intraocular lens; PCIOL posterior chamber intraocular lens; Phaco/IOL phacoemulsification with intraocular lens placement; [place] photorefractive keratectomy; LASIK laser assisted in situ keratomileusis; HTN hypertension; DM diabetes mellitus; COPD chronic obstructive pulmonary disease

## 2022-07-11 ENCOUNTER — Encounter (INDEPENDENT_AMBULATORY_CARE_PROVIDER_SITE_OTHER): Payer: 59 | Admitting: Ophthalmology

## 2022-07-18 ENCOUNTER — Encounter (INDEPENDENT_AMBULATORY_CARE_PROVIDER_SITE_OTHER): Payer: Self-pay | Admitting: Ophthalmology

## 2022-07-18 ENCOUNTER — Ambulatory Visit (INDEPENDENT_AMBULATORY_CARE_PROVIDER_SITE_OTHER): Payer: 59 | Admitting: Ophthalmology

## 2022-07-18 DIAGNOSIS — H34231 Retinal artery branch occlusion, right eye: Secondary | ICD-10-CM

## 2022-07-18 DIAGNOSIS — H35033 Hypertensive retinopathy, bilateral: Secondary | ICD-10-CM | POA: Diagnosis not present

## 2022-07-18 DIAGNOSIS — Z961 Presence of intraocular lens: Secondary | ICD-10-CM

## 2022-07-18 DIAGNOSIS — H34831 Tributary (branch) retinal vein occlusion, right eye, with macular edema: Secondary | ICD-10-CM

## 2022-07-18 DIAGNOSIS — I10 Essential (primary) hypertension: Secondary | ICD-10-CM

## 2022-07-18 DIAGNOSIS — H33103 Unspecified retinoschisis, bilateral: Secondary | ICD-10-CM

## 2022-07-18 DIAGNOSIS — H25812 Combined forms of age-related cataract, left eye: Secondary | ICD-10-CM

## 2022-07-18 MED ORDER — AFLIBERCEPT 2MG/0.05ML IZ SOLN FOR KALEIDOSCOPE
2.0000 mg | INTRAVITREAL | Status: AC | PRN
  Administered 2022-07-18: 2 mg via INTRAVITREAL

## 2022-08-16 NOTE — Progress Notes (Signed)
[facility] Note  08/29/2022     CHIEF COMPLAINT Patient presents for Retina Follow Up   HISTORY OF PRESENT ILLNESS: Jacqueline Greer is a 62 y.o. female who presents to the clinic today for:   HPI     Retina Follow Up   Patient presents with  CRVO/BRVO.  In both eyes.  This started 6 weeks ago.  Duration of 6 weeks.  Since onset it is stable.  I, the attending physician,  performed the HPI with the patient and updated documentation appropriately.        Comments   6 week retina follow up BRVO OD and I'VE OD pt is reporting no vision changes noticed she denies any flashes or floaters       Last edited by Bernarda Caffey, MD on 08/29/2022 11:22 AM.      Referring physician: Julian Hy, PA-C [place] ,  [place] [id number]  HISTORICAL INFORMATION:   Selected notes from the MEDICAL RECORD NUMBER Referred by Dr. Parke Simmers for concern of BRVO   CURRENT MEDICATIONS: No current outpatient medications on file. (Ophthalmic Drugs)   No current facility-administered medications for this visit. (Ophthalmic Drugs)   Current Outpatient Medications (Other)  Medication Sig   omeprazole (PRILOSEC) 20 MG capsule Take 20 mg by mouth daily.   No current facility-administered medications for this visit. (Other)   REVIEW OF SYSTEMS: ROS   Positive for: Eyes Negative for: Constitutional, Gastrointestinal, Neurological, Skin, Genitourinary, Musculoskeletal, HENT, Endocrine, Cardiovascular, Respiratory, Psychiatric, Allergic/Imm, Heme/Lymph Last edited by Parthenia Ames, COT on 08/29/2022  8:37 AM.      ALLERGIES No Known Allergies  PAST MEDICAL HISTORY Past Medical History:  Diagnosis Date   Cataract    OU   Hypertensive retinopathy    OU   Past Surgical History:  Procedure Laterality Date   LAPAROSCOPIC TUBAL LIGATION  2003   FAMILY HISTORY Family History  Problem Relation Age of Onset   Diabetes Mother    Kidney  failure Father    SOCIAL HISTORY Social History   Tobacco Use   Smoking status: Never   Smokeless tobacco: Never  Vaping Use   Vaping Use: Never used  Substance Use Topics   Alcohol use: No   Drug use: No       OPHTHALMIC EXAM:  Base Eye Exam     Visual Acuity (Snellen - Linear)       Right Left   Dist [place] 20/30    Dist cc  20/30    Correction: Glasses         Tonometry (Tonopen, 8:41 AM)       Right Left   Pressure 16 16         Pupils       Pupils Dark Light Shape React APD   Right PERRL 4 3 Round Brisk None   Left PERRL 4 3 Round Brisk None         Visual Fields       Left Right    Full Full         Extraocular Movement       Right Left    Full, Ortho Full, Ortho         Neuro/Psych     Oriented x3: Yes   Mood/Affect: Normal         Dilation     Both eyes: 2.5% Phenylephrine @ 8:41 AM  Slit Lamp and Fundus Exam     Slit Lamp Exam       Right Left   Lids/Lashes Dermatochalasis - upper lid Normal   Conjunctiva/Sclera Nasal and temporal Pinguecula, Melanosis Nasal and Pinguecula, Melanosis   Cornea well healed cataract wound, tear film debris, 2+ Punctate epithelial erosions trace Punctate epithelial erosions, trace tear film debris   Anterior Chamber deep and clear, no cell or flare Deep and clear   Iris Round and dilated, No NVI Round and dilated, No NVI   Lens PC IOL in good position, trace Posterior capsular opacification 2+ Nuclear sclerosis, 2-3+ Cortical cataract   Anterior Vitreous mild Vitreous syneresis, Posterior vitreous detachment mild Vitreous syneresis         Fundus Exam       Right Left   Disc 2+Pallor, +PPP Pink and Sharp, temporal PPP, no heme   C/D Ratio 0.5 0.5   Macula good foveal reflex, stable improvement in central edema, focal IRH with edema and exudates SN macula -- improved focal cluster of IRH and exudates temporal mac -- improving Flat, Good foveal reflex, RPE mottling and  clumping, No heme or edema, geographic area of hypopigmented RPE nasal and superior to fovea -- stable from prior   Vessels attenuated, Tortuous, mild copper wiring, mild AV crossing changes attenuated, Tortuous   Periphery Attached, DBH temporal periphery -- extension of BRVO, bullous schisis cavity IT periphery (from 0700-0900) - stable; focal pigmented CR scar at 0130 midzone, no RT/RD, WWP temporally, pigmented cystoid degeneration inferiorly Attached, no heme, WWP temporal and inferior quadrants, shallow schisis IT periphery           Refraction     Wearing Rx       Sphere Cylinder Axis Add   Right       Left -4.00 +0.50 015 +2.25           IMAGING AND PROCEDURES  Imaging and Procedures for @TODAY @  OCT, Retina - OU - Both Eyes       Right Eye Quality was good. Central Foveal Thickness: 227. Progression has improved. Findings include normal foveal contour, no SRF, intraretinal hyper-reflective material, intraretinal fluid (Stable improvement in central IRF and SRF, focal pocket of IRF/IRHM SN macula -- improved).   Left Eye Quality was good. Central Foveal Thickness: 233. Progression has been stable. Findings include normal foveal contour, no IRF, no SRF (Partial PVD, Shallow IT Retinoschisis).   Notes *Images captured and stored on drive  Diagnosis / Impression:  OD: Stable improvement in central IRF and SRF, focal pocket of IRF/IRHM SN macula -- improved OS: NFP, no IRF/SRF; Partial PVD, shallow IT retinoschisis   Clinical management:  See below  Abbreviations: NFP - Normal foveal profile. CME - cystoid macular edema. PED - pigment epithelial detachment. IRF - intraretinal fluid. SRF - subretinal fluid. EZ - ellipsoid zone. ERM - epiretinal membrane. ORA - outer retinal atrophy. ORT - outer retinal tubulation. SRHM - subretinal hyper-reflective material      Intravitreal Injection, Pharmacologic Agent - OD - Right Eye       Time Out 08/29/2022. 9:25 AM.  Confirmed correct patient, procedure, site, and patient consented.   Anesthesia Topical anesthesia was used. Anesthetic medications included Lidocaine 2%, Proparacaine 0.5%.   Procedure Preparation included 5% betadine to ocular surface, eyelid speculum. A (32g) needle was used.   Injection: 2 mg aflibercept 2 MG/0.05ML   Route: Intravitreal, Site: Right Eye   NDC: [id number], Lot: [id number], Expiration date: 08/25/2023,  Waste: 0 mL   Post-op Post injection exam found visual acuity of at least counting fingers. The patient tolerated the procedure well. There were no complications. The patient received written and verbal post procedure care education. Post injection medications were not given.   Notes              ASSESSMENT/PLAN:   ICD-10-CM   1. 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 Retinal artery branch occlusion of right eye  H34.231     3. Essential hypertension  I10     4. Hypertensive retinopathy of both eyes  H35.033     5. Combined forms of age-related cataract of left eye  H25.812     6. 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 BRVO with CME OD  - lost to follow up to 16 wks instead of 9 (08.10.23-11.30.23)  - lost to follow up from 6 weeks to 8 weeks 912.28.23 - 02.22.24)  - s/p IVA OD #1 (10.05.20), #2 (11.02.20), #3 (11.30.20), #4 (01.07.21), #5 (02.05.21), #6 (03.05.21), #7 (04.02.21), #8 (05.04.21), #9 (06.03.21), #10 (07.22.21), #11 (08.19.21), #12 (09.16.21) -- IVA resistance  - s/p IVE OD #1 (10.14.21), #2 (11.11.21), #3 (12.09.21), #4 (01.13.22), #5 (2.10.22), #6 (04.05.22), #7 (05.26.22), #8 (07.21.22), #9 (09.29.22), #10 (12.01.22), #11 (02.02.23), #12 (04.06.23), #13 (06.08.23), #14 (08.10.23), #15 (11.30.23), #16 (12.28.23), #17 (02.22.24)  - **history  of increased IRF at 10 wk interval, noted on 9.29.22 visit and 16 wks on 11.30.23 visit**  - initial exam and OCT findings suggestive of a BRAO component contributing  - BCVA OD 20/30   - exam shows interval improvement in macular edema superior macula  - OCT shows Stable improvement in central IRF and SRF, focal pocket of IRF/IRHM SN macula -- improved at 6 weeks  - recommend IVE OD #18 today, 04.02.24 for BRVO w/ CME with f/u in 6 weeks again  - pt wishes to proceed  - RBA of procedure discussed, questions answered  - informed consent obtained  - see procedure note  - Avastin informed consent obtained and re-signed on 06.08.23  - Eylea[employer] benefits investigation started 9.16.21 -- approved for 2024 (Good Days Copay card)  - F/U 6 weeks -- DFE/OCT/possible injection, widefield OCT through schisis  3,4. Hypertensive retinopathy OU  - pt not formally diagnosed with HTN  - discussed importance of tight BP control and its relation to problems #1-2 above  - advised discussion with PCP and possible calibration of home BP cuff with PCP's  - monitor   5. Mixed form age related cataract OS  - The symptoms of cataract, surgical options, and treatments and risks were discussed with patient.  - discussed diagnosis and progression  - under the expert management of Dr. Lucianne Lei  6. Pseudophakia OD  - s/p CE/IOL (Dr. Lucianne Lei, 10.18.23)  - IOL in good position  - post - op CME + BRVO w/ CME -- currently on Pred and Ketorolac QID OD -- continue  - monitor  7. Retinoschisis OU  - shallow schisis inferotemporal periphery OU -- stable  - stable on widefield OCT  - no RT/RD on scleral depression  - pt asymptomatic  - monitor   Ophthalmic Meds Ordered this visit:  Meds  ordered this encounter  Medications   aflibercept (EYLEA) SOLN 2 mg     Return in about 6 weeks (around 10/10/2022) for BRVO OD, DFE, OCT.  There are no Patient Instructions on file for this visit.   Explained the diagnoses, plan, and  follow up with the patient and they expressed understanding.  Patient expressed understanding of the importance of proper follow up care.   This document serves as a record of services personally performed by Gardiner Sleeper, MD, PhD. It was created on their behalf by San Jetty. Owens Shark, OA an ophthalmic technician. The creation of this record is the provider's dictation and/or activities during the visit.    Electronically signed by: San Jetty. Owens Shark, [place] 03.22.2024 11:23 AM  Gardiner Sleeper, M.D., Ph.D. Diseases & Surgery of the Retina and Vitreous [facility]  I have reviewed the above documentation for accuracy and completeness, and I agree with the above. Gardiner Sleeper, M.D., Ph.D. 08/29/22 11:24 AM    Abbreviations: M myopia (nearsighted); A astigmatism; H hyperopia (farsighted); P presbyopia; Mrx spectacle prescription;  CTL contact lenses; OD right eye; OS left eye; OU both eyes  XT exotropia; ET esotropia; PEK punctate epithelial keratitis; PEE punctate epithelial erosions; DES dry eye syndrome; MGD meibomian gland dysfunction; ATs artificial tears; PFAT's preservative free artificial tears; [place] nuclear sclerotic cataract; PSC posterior subcapsular cataract; ERM epi-retinal membrane; PVD posterior vitreous detachment; RD retinal detachment; DM diabetes mellitus; DR diabetic retinopathy; NPDR non-proliferative diabetic retinopathy; PDR proliferative diabetic retinopathy; CSME clinically significant macular edema; DME diabetic macular edema; dbh dot blot hemorrhages; CWS cotton wool spot; POAG primary open angle glaucoma; C/D cup-to-disc ratio; HVF humphrey visual field; GVF goldmann visual field; OCT optical coherence tomography; IOP intraocular pressure; BRVO Branch retinal vein occlusion; CRVO central retinal vein occlusion; CRAO central retinal artery occlusion; BRAO branch retinal artery occlusion; RT retinal tear; SB scleral buckle; PPV pars plana vitrectomy; VH  Vitreous hemorrhage; PRP panretinal laser photocoagulation; IVK intravitreal kenalog; VMT vitreomacular traction; MH Macular hole;  NVD neovascularization of the disc; NVE neovascularization elsewhere; AREDS age related eye disease study; ARMD age related macular degeneration; POAG primary open angle glaucoma; EBMD epithelial/anterior basement membrane dystrophy; ACIOL anterior chamber intraocular lens; IOL intraocular lens; PCIOL posterior chamber intraocular lens; Phaco/IOL phacoemulsification with intraocular lens placement; Lewis photorefractive keratectomy; LASIK laser assisted in situ keratomileusis; HTN hypertension; DM diabetes mellitus; COPD chronic obstructive pulmonary disease

## 2022-08-29 ENCOUNTER — Ambulatory Visit (INDEPENDENT_AMBULATORY_CARE_PROVIDER_SITE_OTHER): Payer: 59 | Admitting: Ophthalmology

## 2022-08-29 ENCOUNTER — Encounter (INDEPENDENT_AMBULATORY_CARE_PROVIDER_SITE_OTHER): Payer: Self-pay | Admitting: Ophthalmology

## 2022-08-29 DIAGNOSIS — H35033 Hypertensive retinopathy, bilateral: Secondary | ICD-10-CM | POA: Diagnosis not present

## 2022-08-29 DIAGNOSIS — H25812 Combined forms of age-related cataract, left eye: Secondary | ICD-10-CM

## 2022-08-29 DIAGNOSIS — Z961 Presence of intraocular lens: Secondary | ICD-10-CM

## 2022-08-29 DIAGNOSIS — I10 Essential (primary) hypertension: Secondary | ICD-10-CM | POA: Diagnosis not present

## 2022-08-29 DIAGNOSIS — H34831 Tributary (branch) retinal vein occlusion, right eye, with macular edema: Secondary | ICD-10-CM

## 2022-08-29 DIAGNOSIS — H34231 Retinal artery branch occlusion, right eye: Secondary | ICD-10-CM | POA: Diagnosis not present

## 2022-08-29 DIAGNOSIS — H33103 Unspecified retinoschisis, bilateral: Secondary | ICD-10-CM

## 2022-08-29 MED ORDER — AFLIBERCEPT 2MG/0.05ML IZ SOLN FOR KALEIDOSCOPE
2.0000 mg | INTRAVITREAL | Status: AC | PRN
  Administered 2022-08-29: 2 mg via INTRAVITREAL

## 2022-10-10 ENCOUNTER — Encounter (INDEPENDENT_AMBULATORY_CARE_PROVIDER_SITE_OTHER): Payer: 59 | Admitting: Ophthalmology

## 2022-10-10 ENCOUNTER — Ambulatory Visit (INDEPENDENT_AMBULATORY_CARE_PROVIDER_SITE_OTHER): Payer: 59 | Admitting: Ophthalmology

## 2022-10-10 ENCOUNTER — Encounter (INDEPENDENT_AMBULATORY_CARE_PROVIDER_SITE_OTHER): Payer: Self-pay | Admitting: Ophthalmology

## 2022-10-10 DIAGNOSIS — H35033 Hypertensive retinopathy, bilateral: Secondary | ICD-10-CM

## 2022-10-10 DIAGNOSIS — I10 Essential (primary) hypertension: Secondary | ICD-10-CM

## 2022-10-10 DIAGNOSIS — H34831 Tributary (branch) retinal vein occlusion, right eye, with macular edema: Secondary | ICD-10-CM

## 2022-10-10 DIAGNOSIS — H34231 Retinal artery branch occlusion, right eye: Secondary | ICD-10-CM | POA: Diagnosis not present

## 2022-10-10 DIAGNOSIS — H25812 Combined forms of age-related cataract, left eye: Secondary | ICD-10-CM

## 2022-10-10 DIAGNOSIS — H33103 Unspecified retinoschisis, bilateral: Secondary | ICD-10-CM

## 2022-10-10 DIAGNOSIS — Z961 Presence of intraocular lens: Secondary | ICD-10-CM

## 2022-10-10 MED ORDER — AFLIBERCEPT 2MG/0.05ML IZ SOLN FOR KALEIDOSCOPE
2.0000 mg | INTRAVITREAL | Status: AC | PRN
  Administered 2022-10-10: 2 mg via INTRAVITREAL

## 2022-10-10 NOTE — Progress Notes (Signed)
[facility] - Clinic Note  10/10/2022     CHIEF COMPLAINT Patient presents for Retina Follow Up   HISTORY OF PRESENT ILLNESS: Jacqueline Greer is a 62 y.o. female who presents to the clinic today for:   HPI     Retina Follow Up   Patient presents with  CRVO/BRVO.  In right eye.  This started 6 weeks ago.  I, the attending physician,  performed the HPI with the patient and updated documentation appropriately.        Comments   Patient here for 6 weeks retina follow up for BRVO OD. Patient states vision doing ok. No eye pain.       Last edited by Rennis Chris, MD on 10/10/2022 11:41 AM.     Referring physician: Luciano Cutter, PA-C [address] [place] ,  [place] [address]  HISTORICAL INFORMATION:   Selected notes from the MEDICAL RECORD NUMBER Referred by Dr. Krista Blue for concern of BRVO   CURRENT MEDICATIONS: No current outpatient medications on file. (Ophthalmic Drugs)   No current facility-administered medications for this visit. (Ophthalmic Drugs)   Current Outpatient Medications (Other)  Medication Sig   omeprazole (PRILOSEC) 20 MG capsule Take 20 mg by mouth daily.   No current facility-administered medications for this visit. (Other)   REVIEW OF SYSTEMS: ROS   Positive for: Eyes Negative for: Constitutional, Gastrointestinal, Neurological, Skin, Genitourinary, Musculoskeletal, HENT, Endocrine, Cardiovascular, Respiratory, Psychiatric, Allergic/Imm, Heme/Lymph Last edited by Laddie Aquas, COA on 10/10/2022  9:22 AM.     ALLERGIES No Known Allergies  PAST MEDICAL HISTORY Past Medical History:  Diagnosis Date   Cataract    OU   Hypertensive retinopathy    OU   Past Surgical History:  Procedure Laterality Date   LAPAROSCOPIC TUBAL LIGATION  2003   FAMILY HISTORY Family History  Problem Relation Age of Onset   Diabetes Mother    Kidney failure Father    SOCIAL HISTORY Social History   Tobacco Use   Smoking  status: Never   Smokeless tobacco: Never  Vaping Use   Vaping Use: Never used  Substance Use Topics   Alcohol use: No   Drug use: No       OPHTHALMIC EXAM:  Base Eye Exam     Visual Acuity (Snellen - Linear)       Right Left   Dist cc 20/25 -1 20/25 -1   Dist ph cc 20/20 20/25 +1    Correction: Glasses         Tonometry (Tonopen, 9:19 AM)       Right Left   Pressure 15 18         Pupils       Dark Light Shape React APD   Right 4 3 Round Brisk None   Left 4 3 Round Brisk None         Visual Fields (Counting fingers)       Left Right    Full Full         Extraocular Movement       Right Left    Full, Ortho Full, Ortho         Neuro/Psych     Oriented x3: Yes   Mood/Affect: Normal         Dilation     Both eyes: 1.0% Mydriacyl, 2.5% Phenylephrine @ 9:19 AM           Slit Lamp and Fundus Exam  Slit Lamp Exam       Right Left   Lids/Lashes Dermatochalasis - upper lid Normal   Conjunctiva/Sclera Nasal and temporal Pinguecula, Melanosis Nasal and Pinguecula, Melanosis   Cornea well healed cataract wound, tear film debris, 2+ Punctate epithelial erosions trace Punctate epithelial erosions, trace tear film debris   Anterior Chamber deep and clear, no cell or flare Deep and clear   Iris Round and dilated, No NVI Round and dilated, No NVI   Lens PC IOL in good position, trace Posterior capsular opacification 2+ Nuclear sclerosis, 2-3+ Cortical cataract   Anterior Vitreous mild Vitreous syneresis, Posterior vitreous detachment mild Vitreous syneresis         Fundus Exam       Right Left   Disc 2+Pallor, +PPP Pink and Sharp, temporal PPP, no heme   C/D Ratio 0.5 0.5   Macula good foveal reflex, stable improvement in central edema, focal IRH with edema and exudates SN and temporal macula -- slightly improved Flat, Good foveal reflex, RPE mottling and clumping, No heme or edema, geographic area of hypopigmented RPE nasal and superior  to fovea -- stable from prior   Vessels attenuated, Tortuous, mild copper wiring, mild AV crossing changes attenuated, Tortuous   Periphery Attached, DBH temporal periphery -- extension of BRVO, bullous schisis cavity IT periphery (from 0700-0900) - stable; focal pigmented CR scar at 0130 midzone, no RT/RD, WWP temporally, pigmented cystoid degeneration inferiorly Attached, no heme, WWP temporal and inferior quadrants, shallow schisis IT periphery           Refraction     Wearing Rx       Sphere Cylinder Axis Add   Right -0.50 +1.00 152 +2.25   Left -4.50 +0.75 019 +2.25           IMAGING AND PROCEDURES  Imaging and Procedures for @TODAY @  OCT, Retina - OU - Both Eyes       Right Eye Quality was good. Central Foveal Thickness: 231. Progression has been stable. Findings include normal foveal contour, no SRF, intraretinal hyper-reflective material, intraretinal fluid (Stable improvement in central IRF and SRF, persistent pocket of IRF/IRHM SN macula).   Left Eye Quality was good. Central Foveal Thickness: 235. Progression has been stable. Findings include normal foveal contour, no IRF, no SRF (Partial PVD, Shallow IT Retinoschisis).   Notes *Images captured and stored on drive  Diagnosis / Impression:  OD: Stable improvement in central IRF and SRF, persistent pocket of IRF/IRHM SN macula  OS: NFP, no IRF/SRF; Partial PVD, shallow IT retinoschisis   Clinical management:  See below  Abbreviations: NFP - Normal foveal profile. CME - cystoid macular edema. PED - pigment epithelial detachment. IRF - intraretinal fluid. SRF - subretinal fluid. EZ - ellipsoid zone. ERM - epiretinal membrane. ORA - outer retinal atrophy. ORT - outer retinal tubulation. SRHM - subretinal hyper-reflective material      Intravitreal Injection, Pharmacologic Agent - OD - Right Eye       Time Out 10/10/2022. 10:14 AM. Confirmed correct patient, procedure, site, and patient consented.    Anesthesia Topical anesthesia was used. Anesthetic medications included Lidocaine 2%, Proparacaine 0.5%.   Procedure Preparation included 5% betadine to ocular surface, eyelid speculum. A (32g) needle was used.   Injection: 2 mg aflibercept 2 MG/0.05ML   Route: Intravitreal, Site: Right Eye   NDC: L6038910, Lot: [id number], Expiration date: 09/24/2023, Waste: 0 mL   Post-op Post injection exam found visual acuity of at least counting fingers. The  patient tolerated the procedure well. There were no complications. The patient received written and verbal post procedure care education. Post injection medications were not given.   Notes             ASSESSMENT/PLAN:   ICD-10-CM   1. 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 Retinal artery branch occlusion of right eye  H34.231     3. Essential hypertension  I10     4. Hypertensive retinopathy of both eyes  H35.033     5. Combined forms of age-related cataract of left eye  H25.812     6. 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 BRVO with CME OD  - lost to follow up to 16 wks instead of 9 (08.10.23-11.30.23)  - lost to follow up from 6 weeks to 8 weeks 912.28.23 - 02.22.24)  - s/p IVA OD #1 (10.05.20), #2 (11.02.20), #3 (11.30.20), #4 (01.07.21), #5 (02.05.21), #6 (03.05.21), #7 (04.02.21), #8 (05.04.21), #9 (06.03.21), #10 (07.22.21), #11 (08.19.21), #12 (09.16.21) -- IVA resistance  - s/p IVE OD #1 (10.14.21), #2 (11.11.21), #3 (12.09.21), #4 (01.13.22), #5 (2.10.22), #6 (04.05.22), #7 (05.26.22), #8 (07.21.22), #9 (09.29.22), #10 (12.01.22), #11 (02.02.23), #12 (04.06.23), #13 (06.08.23), #14 (08.10.23), #15 (11.30.23), #16 (12.28.23), #17 (02.22.24), #18 (04.02.24)  - **history of increased IRF at 10 wk interval, noted on 9.29.22  visit and 16 wks on 11.30.23 visit**  - initial exam and OCT findings suggestive of a BRAO component contributing  - BCVA OD 20/30   - exam shows interval improvement in macular edema superior macula  - OCT shows Stable improvement in central IRF and SRF, focal pocket of IRF/IRHM SN macula -- improved at 6 weeks  - recommend IVE OD #19 today, 05.16.24 for BRVO w/ CME with f/u in 6 weeks again  - pt wishes to proceed  - RBA of procedure discussed, questions answered  - informed consent obtained  - see procedure note  - Avastin informed consent obtained and re-signed on 06.08.23  - Eylea[employer] benefits investigation started 9.16.21 -- approved for 2024 (Good Days Copay card)  - F/U 6 weeks -- DFE/OCT/possible injection, widefield OCT through schisis  3,4. Hypertensive retinopathy OU  - pt not formally diagnosed with HTN  - discussed importance of tight BP control and its relation to problems #1-2 above  - advised discussion with PCP and possible calibration of home BP cuff with PCP's  - monitor   5. Mixed form age related cataract OS  - The symptoms of cataract, surgical options, and treatments and risks were discussed with patient.  - discussed diagnosis and progression  - under the expert management of Dr. Zenaida Niece  6. Pseudophakia OD  - s/p CE/IOL (Dr. Zenaida Niece, 10.18.23)  - IOL in good position  - post - op CME + BRVO w/ CME -- currently on Pred and Ketorolac QID OD -- continue  - monitor  7. Retinoschisis OU  - shallow schisis inferotemporal periphery OU -- stable  - stable on widefield OCT  - no RT/RD on scleral depression  - pt asymptomatic  - monitor   Ophthalmic Meds Ordered this visit:  Meds ordered this encounter  Medications   aflibercept (EYLEA) SOLN 2 mg     Return  in about 6 weeks (around 11/21/2022) for f/u BRVO OD, DFE, OCT.  There are no Patient Instructions on file for this visit.   Explained the diagnoses, plan, and follow up with the patient and they expressed  understanding.  Patient expressed understanding of the importance of proper follow up care.   This document serves as a record of services personally performed by Karie Chimera, MD, PhD. It was created on their behalf by Glee Arvin. Manson Passey, OA an ophthalmic technician. The creation of this record is the provider's dictation and/or activities during the visit.    Electronically signed by: Glee Arvin. Manson Passey, [place] 05.16.2024 11:43 AM   Karie Chimera, M.D., Ph.D. Diseases & Surgery of the Retina and Vitreous [facility]  I have reviewed the above documentation for accuracy and completeness, and I agree with the above. Karie Chimera, M.D., Ph.D. 10/10/22 11:44 AM   Abbreviations: M myopia (nearsighted); A astigmatism; H hyperopia (farsighted); P presbyopia; Mrx spectacle prescription;  CTL contact lenses; OD right eye; OS left eye; OU both eyes  XT exotropia; ET esotropia; PEK punctate epithelial keratitis; PEE punctate epithelial erosions; DES dry eye syndrome; MGD meibomian gland dysfunction; ATs artificial tears; PFAT's preservative free artificial tears; NSC nuclear sclerotic cataract; PSC posterior subcapsular cataract; ERM epi-retinal membrane; PVD posterior vitreous detachment; RD retinal detachment; DM diabetes mellitus; DR diabetic retinopathy; NPDR non-proliferative diabetic retinopathy; PDR proliferative diabetic retinopathy; CSME clinically significant macular edema; DME diabetic macular edema; dbh dot blot hemorrhages; CWS cotton wool spot; POAG primary open angle glaucoma; C/D cup-to-disc ratio; HVF humphrey visual field; GVF goldmann visual field; OCT optical coherence tomography; IOP intraocular pressure; BRVO Branch retinal vein occlusion; CRVO central retinal vein occlusion; CRAO central retinal artery occlusion; BRAO branch retinal artery occlusion; RT retinal tear; SB scleral buckle; PPV pars plana vitrectomy; VH Vitreous hemorrhage; PRP panretinal laser  photocoagulation; IVK intravitreal kenalog; VMT vitreomacular traction; MH Macular hole;  NVD neovascularization of the disc; NVE neovascularization elsewhere; AREDS age related eye disease study; ARMD age related macular degeneration; POAG primary open angle glaucoma; EBMD epithelial/anterior basement membrane dystrophy; ACIOL anterior chamber intraocular lens; IOL intraocular lens; PCIOL posterior chamber intraocular lens; Phaco/IOL phacoemulsification with intraocular lens placement; PRK photorefractive keratectomy; LASIK laser assisted in situ keratomileusis; HTN hypertension; DM diabetes mellitus; COPD chronic obstructive pulmonary disease

## 2022-11-14 NOTE — Progress Notes (Signed)
[facility] - Clinic Note  11/21/2022     CHIEF COMPLAINT Patient presents for Retina Follow Up   HISTORY OF PRESENT ILLNESS: Jacqueline Greer is a 62 y.o. female who presents to the clinic today for:   HPI     Retina Follow Up   Patient presents with  CRVO/BRVO.  In right eye.  This started months ago.  Duration of 6 weeks.  I, the attending physician,  performed the HPI with the patient and updated documentation appropriately.        Comments   Patient feels that the vision is the same. She is not using eye drops at this time.       Last edited by Rennis Chris, MD on 11/21/2022  8:54 AM.      Referring physician: Luciano Cutter, PA-C [address] [place] ,  [place] [address]  HISTORICAL INFORMATION:   Selected notes from the MEDICAL RECORD NUMBER Referred by Dr. Krista Blue for concern of BRVO   CURRENT MEDICATIONS: No current outpatient medications on file. (Ophthalmic Drugs)   No current facility-administered medications for this visit. (Ophthalmic Drugs)   Current Outpatient Medications (Other)  Medication Sig   omeprazole (PRILOSEC) 20 MG capsule Take 20 mg by mouth daily.   No current facility-administered medications for this visit. (Other)   REVIEW OF SYSTEMS: ROS   Positive for: Eyes Negative for: Constitutional, Gastrointestinal, Neurological, Skin, Genitourinary, Musculoskeletal, HENT, Endocrine, Cardiovascular, Respiratory, Psychiatric, Allergic/Imm, Heme/Lymph Last edited by Charlette Caffey, COT on 11/21/2022  8:12 AM.     ALLERGIES No Known Allergies  PAST MEDICAL HISTORY Past Medical History:  Diagnosis Date   Cataract    OU   Hypertensive retinopathy    OU   Past Surgical History:  Procedure Laterality Date   LAPAROSCOPIC TUBAL LIGATION  2003   FAMILY HISTORY Family History  Problem Relation Age of Onset   Diabetes Mother    Kidney failure Father    SOCIAL HISTORY Social History   Tobacco Use    Smoking status: Never   Smokeless tobacco: Never  Vaping Use   Vaping Use: Never used  Substance Use Topics   Alcohol use: No   Drug use: No       OPHTHALMIC EXAM:  Base Eye Exam     Visual Acuity (Snellen - Linear)       Right Left   Dist cc 20/20 +2 20/30   Dist ph cc  20/25    Correction: Glasses         Tonometry (Tonopen, 8:16 AM)       Right Left   Pressure 18 19         Pupils       Dark Light Shape React APD   Right 4 3 Round Brisk None   Left 4 3 Round Brisk None         Visual Fields       Left Right    Full Full         Extraocular Movement       Right Left    Full, Ortho Full, Ortho         Neuro/Psych     Oriented x3: Yes   Mood/Affect: Normal         Dilation     Both eyes: 1.0% Mydriacyl, 2.5% Phenylephrine @ 8:13 AM           Slit Lamp and Fundus Exam  Slit Lamp Exam       Right Left   Lids/Lashes Dermatochalasis - upper lid Normal   Conjunctiva/Sclera Nasal and temporal Pinguecula, Melanosis Nasal and Pinguecula, Melanosis   Cornea well healed cataract wound, tear film debris, 2+ Punctate epithelial erosions trace Punctate epithelial erosions, trace tear film debris   Anterior Chamber deep and clear, no cell or flare Deep and clear   Iris Round and dilated, No NVI Round and dilated, No NVI   Lens PC IOL in good position, trace Posterior capsular opacification 2+ Nuclear sclerosis, 2-3+ Cortical cataract   Anterior Vitreous mild Vitreous syneresis, Posterior vitreous detachment mild Vitreous syneresis         Fundus Exam       Right Left   Disc 2+Pallor, +PPP Pink and Sharp, temporal PPP, no heme   C/D Ratio 0.5 0.5   Macula good foveal reflex, stable improvement in central edema, focal IRH with edema and exudates SN and temporal macula -- slightly improved Flat, Good foveal reflex, RPE mottling and clumping, No heme or edema, geographic area of hypopigmented RPE nasal and superior to fovea -- stable  from prior   Vessels attenuated, Tortuous, severe attenuation ST arcades attenuated, Tortuous   Periphery Attached, DBH temporal periphery -- extension of BRVO, bullous schisis cavity IT periphery (from 0700-0900) - stable; focal pigmented CR scar at 0130 midzone, no RT/RD, WWP temporally, pigmented cystoid degeneration inferiorly Attached, no heme, WWP temporal and inferior quadrants, shallow schisis IT periphery           Refraction     Wearing Rx       Sphere Cylinder Axis Add   Right -0.50 +1.00 152 +2.25   Left -4.50 +0.75 019 +2.25           IMAGING AND PROCEDURES  Imaging and Procedures for @TODAY @  OCT, Retina - OU - Both Eyes       Right Eye Quality was good. Central Foveal Thickness: 229. Progression has improved. Findings include normal foveal contour, no SRF, intraretinal hyper-reflective material, intraretinal fluid (Stable improvement in central IRF and SRF, persistent pocket of IRF/IRHM SN macula -- slightly improved).   Left Eye Quality was good. Central Foveal Thickness: 234. Progression has been stable. Findings include normal foveal contour, no IRF, no SRF (Partial PVD, Shallow IT Retinoschisis).   Notes *Images captured and stored on drive  Diagnosis / Impression:  OD: Stable improvement in central IRF and SRF, persistent pocket of IRF/IRHM SN macula -- slightly improved OS: NFP, no IRF/SRF; Partial PVD, shallow IT retinoschisis   Clinical management:  See below  Abbreviations: NFP - Normal foveal profile. CME - cystoid macular edema. PED - pigment epithelial detachment. IRF - intraretinal fluid. SRF - subretinal fluid. EZ - ellipsoid zone. ERM - epiretinal membrane. ORA - outer retinal atrophy. ORT - outer retinal tubulation. SRHM - subretinal hyper-reflective material      Intravitreal Injection, Pharmacologic Agent - OD - Right Eye       Time Out 11/21/2022. 8:46 AM. Confirmed correct patient, procedure, site, and patient consented.    Anesthesia Topical anesthesia was used. Anesthetic medications included Lidocaine 2%, Proparacaine 0.5%.   Procedure Preparation included 5% betadine to ocular surface, eyelid speculum. A (32g) needle was used.   Injection: 2 mg aflibercept 2 MG/0.05ML   Route: Intravitreal, Site: Right Eye   NDC: L6038910, Lot: [id number], Expiration date: 11/24/2023, Waste: 0 mL   Post-op Post injection exam found visual acuity of at least counting fingers.  The patient tolerated the procedure well. There were no complications. The patient received written and verbal post procedure care education. Post injection medications were not given.   Notes             ASSESSMENT/PLAN:   ICD-10-CM   1. 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 Retinal artery branch occlusion of right eye  H34.231     3. Essential hypertension  I10     4. Hypertensive retinopathy of both eyes  H35.033     5. Combined forms of age-related cataract of left eye  H25.812     6. 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 BRVO with CME OD  - lost to follow up to 16 wks instead of 9 (08.10.23-11.30.23)  - lost to follow up from 6 weeks to 8 weeks 912.28.23 - 02.22.24)  - s/p IVA OD #1 (10.05.20), #2 (11.02.20), #3 (11.30.20), #4 (01.07.21), #5 (02.05.21), #6 (03.05.21), #7 (04.02.21), #8 (05.04.21), #9 (06.03.21), #10 (07.22.21), #11 (08.19.21), #12 (09.16.21) -- IVA resistance  - s/p IVE OD #1 (10.14.21), #2 (11.11.21), #3 (12.09.21), #4 (01.13.22), #5 (2.10.22), #6 (04.05.22), #7 (05.26.22), #8 (07.21.22), #9 (09.29.22), #10 (12.01.22), #11 (02.02.23), #12 (04.06.23), #13 (06.08.23), #14 (08.10.23), #15 (11.30.23), #16 (12.28.23), #17 (02.22.24), #18 (04.02.24), #19 (05.16.24)  - **history of increased IRF at 10 wk interval,  noted on 9.29.22 visit and 16 wks on 11.30.23 visit**  - initial exam and OCT findings suggestive of a BRAO component contributing  - BCVA OD 20/20   - exam shows interval improvement in macular edema superior macula  - OCT shows Stable improvement in central IRF and SRF, focal pocket of IRF/IRHM SN macula -- improved at 6 weeks  - recommend IVE OD #20 today, 06.27.24 for BRVO w/ CME with f/u in 6 weeks again  - pt wishes to proceed  - RBA of procedure discussed, questions answered  - informed consent obtained  - see procedure note  - [employer]yle[employer] benefits investigation started 9.16.21 -- approved for 2024 (Good Days Copay card)  - F/U 6 weeks -- DFE/OCT/possible injection, widefield OCT through schisis  3,4. Hypertensive retinopathy OU  - pt not formally diagnosed with HTN  - discussed importance of tight BP control and its relation to problems #1-2 above  - advised discussion with PCP and possible calibration of home BP cuff with PCP's  - monitor   5. Mixed form age related cataract OS  - The symptoms of cataract, surgical options, and treatments and risks were discussed with patient.  - discussed diagnosis and progression  - under the expert management of Dr. Zenaida Niece  6. Pseudophakia OD  - s/p CE/IOL (Dr. Zenaida Niece, 10.18.23)  - IOL in good position  - post - op CME + BRVO w/ CME   - monitor  7. Retinoschisis OU  - shallow schisis inferotemporal periphery OU -- stable  - stable on widefield OCT  - no RT/RD on scleral depression  - pt asymptomatic  - monitor   Ophthalmic Meds Ordered this visit:  Meds ordered this encounter  Medications   aflibercept (EYLEA) SOLN 2 mg     Return in about 6 weeks (around 01/02/2023) for f/u BRVO OD, DFE, OCT.  There are no  Patient Instructions on file for this visit.   Explained the diagnoses, plan, and follow up with the patient and they expressed understanding.  Patient expressed understanding of the importance of proper follow up care.   This  document serves as a record of services personally performed by Karie Chimera, MD, PhD. It was created on their behalf by Glee Arvin. Manson Passey, OA an ophthalmic technician. The creation of this record is the provider's dictation and/or activities during the visit.    Electronically signed by: Glee Arvin. Manson Passey, [place] 06.20.2024 1:22 AM  Karie Chimera, M.D., Ph.D. Diseases & Surgery of the Retina and Vitreous [facility]  I have reviewed the above documentation for accuracy and completeness, and I agree with the above. Karie Chimera, M.D., Ph.D. 11/24/22 1:23 AM   Abbreviations: M myopia (nearsighted); A astigmatism; H hyperopia (farsighted); P presbyopia; Mrx spectacle prescription;  CTL contact lenses; OD right eye; OS left eye; OU both eyes  XT exotropia; ET esotropia; PEK punctate epithelial keratitis; PEE punctate epithelial erosions; DES dry eye syndrome; MGD meibomian gland dysfunction; ATs artificial tears; PFAT's preservative free artificial tears; NSC nuclear sclerotic cataract; PSC posterior subcapsular cataract; ERM epi-retinal membrane; PVD posterior vitreous detachment; RD retinal detachment; DM diabetes mellitus; DR diabetic retinopathy; NPDR non-proliferative diabetic retinopathy; PDR proliferative diabetic retinopathy; CSME clinically significant macular edema; DME diabetic macular edema; dbh dot blot hemorrhages; CWS cotton wool spot; POAG primary open angle glaucoma; C/D cup-to-disc ratio; HVF humphrey visual field; GVF goldmann visual field; OCT optical coherence tomography; IOP intraocular pressure; BRVO Branch retinal vein occlusion; CRVO central retinal vein occlusion; CRAO central retinal artery occlusion; BRAO branch retinal artery occlusion; RT retinal tear; SB scleral buckle; PPV pars plana vitrectomy; VH Vitreous hemorrhage; PRP panretinal laser photocoagulation; IVK intravitreal kenalog; VMT vitreomacular traction; MH Macular hole;  NVD neovascularization of  the disc; NVE neovascularization elsewhere; AREDS age related eye disease study; ARMD age related macular degeneration; POAG primary open angle glaucoma; EBMD epithelial/anterior basement membrane dystrophy; ACIOL anterior chamber intraocular lens; IOL intraocular lens; PCIOL posterior chamber intraocular lens; Phaco/IOL phacoemulsification with intraocular lens placement; PRK photorefractive keratectomy; LASIK laser assisted in situ keratomileusis; HTN hypertension; DM diabetes mellitus; COPD chronic obstructive pulmonary disease

## 2022-11-21 ENCOUNTER — Encounter (INDEPENDENT_AMBULATORY_CARE_PROVIDER_SITE_OTHER): Payer: Self-pay | Admitting: Ophthalmology

## 2022-11-21 ENCOUNTER — Ambulatory Visit (INDEPENDENT_AMBULATORY_CARE_PROVIDER_SITE_OTHER): Payer: 59 | Admitting: Ophthalmology

## 2022-11-21 DIAGNOSIS — H25812 Combined forms of age-related cataract, left eye: Secondary | ICD-10-CM

## 2022-11-21 DIAGNOSIS — H34831 Tributary (branch) retinal vein occlusion, right eye, with macular edema: Secondary | ICD-10-CM

## 2022-11-21 DIAGNOSIS — H35033 Hypertensive retinopathy, bilateral: Secondary | ICD-10-CM

## 2022-11-21 DIAGNOSIS — H34231 Retinal artery branch occlusion, right eye: Secondary | ICD-10-CM | POA: Diagnosis not present

## 2022-11-21 DIAGNOSIS — I10 Essential (primary) hypertension: Secondary | ICD-10-CM | POA: Diagnosis not present

## 2022-11-21 DIAGNOSIS — Z961 Presence of intraocular lens: Secondary | ICD-10-CM

## 2022-11-21 DIAGNOSIS — H33103 Unspecified retinoschisis, bilateral: Secondary | ICD-10-CM

## 2022-11-21 MED ORDER — AFLIBERCEPT 2MG/0.05ML IZ SOLN FOR KALEIDOSCOPE
2.0000 mg | INTRAVITREAL | Status: AC | PRN
Start: 2022-11-21 — End: 2022-11-21
  Administered 2022-11-21: 2 mg via INTRAVITREAL

## 2023-01-02 ENCOUNTER — Encounter (INDEPENDENT_AMBULATORY_CARE_PROVIDER_SITE_OTHER): Payer: 59 | Admitting: Ophthalmology

## 2023-01-02 DIAGNOSIS — H34831 Tributary (branch) retinal vein occlusion, right eye, with macular edema: Secondary | ICD-10-CM

## 2023-01-02 DIAGNOSIS — I10 Essential (primary) hypertension: Secondary | ICD-10-CM

## 2023-01-02 DIAGNOSIS — H35033 Hypertensive retinopathy, bilateral: Secondary | ICD-10-CM

## 2023-01-02 DIAGNOSIS — H25812 Combined forms of age-related cataract, left eye: Secondary | ICD-10-CM

## 2023-01-02 DIAGNOSIS — H34231 Retinal artery branch occlusion, right eye: Secondary | ICD-10-CM

## 2023-01-02 DIAGNOSIS — Z961 Presence of intraocular lens: Secondary | ICD-10-CM

## 2023-01-02 DIAGNOSIS — H33103 Unspecified retinoschisis, bilateral: Secondary | ICD-10-CM

## 2023-01-02 NOTE — Progress Notes (Signed)
[facility] - Clinic Note  01/07/2023     CHIEF COMPLAINT Patient presents for Retina Follow Up   HISTORY OF PRESENT ILLNESS: Jacqueline Greer is a 62 y.o. female who presents to the clinic today for:   HPI     Retina Follow Up   Patient presents with  CRVO/BRVO.  In right eye.  This started 6 weeks ago.  Duration of 6 weeks.  Since onset it is stable.  I, the attending physician,  performed the HPI with the patient and updated documentation appropriately.        Comments   6 week retina follow up BRVO OD and I'VE OD pt is reporting no vision changes noticed she denies any flashes or floaters       Last edited by Rennis Chris, MD on 01/07/2023 12:29 PM.    Pt states her blood sugar has been high, she has an appt with her dr on Monday, she is not taking any medication for diabetes, her BP is under control   Referring physician: Luciano Cutter, PA-C [address] [place] ,  [place] [address]  HISTORICAL INFORMATION:   Selected notes from the MEDICAL RECORD NUMBER Referred by Dr. Krista Blue for concern of BRVO   CURRENT MEDICATIONS: No current outpatient medications on file. (Ophthalmic Drugs)   No current facility-administered medications for this visit. (Ophthalmic Drugs)   Current Outpatient Medications (Other)  Medication Sig   omeprazole (PRILOSEC) 20 MG capsule Take 20 mg by mouth daily.   No current facility-administered medications for this visit. (Other)   REVIEW OF SYSTEMS: ROS   Positive for: Eyes Negative for: Constitutional, Gastrointestinal, Neurological, Skin, Genitourinary, Musculoskeletal, HENT, Endocrine, Cardiovascular, Respiratory, Psychiatric, Allergic/Imm, Heme/Lymph Last edited by Etheleen Mayhew, COT on 01/07/2023  8:43 AM.      ALLERGIES No Known Allergies  PAST MEDICAL HISTORY Past Medical History:  Diagnosis Date   Cataract    OU   Hypertensive retinopathy    OU   Past Surgical History:   Procedure Laterality Date   LAPAROSCOPIC TUBAL LIGATION  2003   FAMILY HISTORY Family History  Problem Relation Age of Onset   Diabetes Mother    Kidney failure Father    SOCIAL HISTORY Social History   Tobacco Use   Smoking status: Never   Smokeless tobacco: Never  Vaping Use   Vaping status: Never Used  Substance Use Topics   Alcohol use: No   Drug use: No       OPHTHALMIC EXAM:  Base Eye Exam     Visual Acuity (Snellen - Linear)       Right Left   Dist cc 20/25 -3 20/30 -2   Dist ph cc 20/20 -2 20/25 -2    Correction: Glasses         Tonometry (Tonopen, 8:49 AM)       Right Left   Pressure 18 18         Pupils       Pupils Dark Light Shape React APD   Right PERRL 4 3 Round Brisk None   Left PERRL 4 3 Round Brisk None         Visual Fields       Left Right    Full Full         Extraocular Movement       Right Left    Full, Ortho Full, Ortho         Neuro/Psych  Oriented x3: Yes   Mood/Affect: Normal         Dilation     Both eyes: 2.5% Phenylephrine @ 8:49 AM           Slit Lamp and Fundus Exam     Slit Lamp Exam       Right Left   Lids/Lashes Dermatochalasis - upper lid Normal   Conjunctiva/Sclera Nasal and temporal Pinguecula, Melanosis Nasal and Pinguecula, Melanosis   Cornea well healed cataract wound, tear film debris, 2+ Punctate epithelial erosions trace Punctate epithelial erosions, trace tear film debris   Anterior Chamber deep and clear, no cell or flare Deep and clear   Iris Round and dilated, No NVI Round and dilated, No NVI   Lens PC IOL in good position, trace Posterior capsular opacification 2+ Nuclear sclerosis, 2-3+ Cortical cataract   Anterior Vitreous mild Vitreous syneresis, Posterior vitreous detachment mild Vitreous syneresis         Fundus Exam       Right Left   Disc 2+Pallor, +PPP Pink and Sharp, temporal PPP, no heme   C/D Ratio 0.5 0.5   Macula good foveal reflex, stable  improvement in central edema, focal edema SN macula -- slightly increased Flat, Good foveal reflex, RPE mottling and clumping, No heme or edema, geographic area of hypopigmented RPE nasal and superior to fovea -- stable from prior   Vessels attenuated, Tortuous, severe attenuation ST arcades attenuated, Tortuous   Periphery Attached, DBH temporal periphery -- extension of BRVO, bullous schisis cavity IT periphery (from 0700-0900) - stable; focal pigmented CR scar at 0130 midzone, no RT/RD, WWOP temporally, pigmented cystoid degeneration inferiorly Attached, no heme, WWP temporal and inferior quadrants, shallow schisis IT periphery           Refraction     Wearing Rx       Sphere Cylinder Axis Add   Right -0.50 +1.00 152 +2.25   Left -4.50 +0.75 019 +2.25           IMAGING AND PROCEDURES  Imaging and Procedures for @TODAY @  OCT, Retina - OU - Both Eyes       Right Eye Quality was good. Central Foveal Thickness: 232. Progression has worsened. Findings include normal foveal contour, no SRF, intraretinal hyper-reflective material, intraretinal fluid (Stable improvement in central IRF and SRF, persistent pocket of IRF/IRHM SN macula -- slightly increased, partial PVD).   Left Eye Quality was good. Central Foveal Thickness: 234. Progression has been stable. Findings include normal foveal contour, no IRF, no SRF (Partial PVD, Shallow IT Retinoschisis).   Notes *Images captured and stored on drive  Diagnosis / Impression:  OD: Stable improvement in central IRF and SRF, persistent pocket of IRF/IRHM SN macula -- slightly increased, partal PVD OS: NFP, no IRF/SRF; Partial PVD, shallow IT retinoschisis   Clinical management:  See below  Abbreviations: NFP - Normal foveal profile. CME - cystoid macular edema. PED - pigment epithelial detachment. IRF - intraretinal fluid. SRF - subretinal fluid. EZ - ellipsoid zone. ERM - epiretinal membrane. ORA - outer retinal atrophy. ORT - outer  retinal tubulation. SRHM - subretinal hyper-reflective material      Intravitreal Injection, Pharmacologic Agent - OD - Right Eye       Time Out 01/07/2023. 9:12 AM. Confirmed correct patient, procedure, site, and patient consented.   Anesthesia Topical anesthesia was used. Anesthetic medications included Lidocaine 2%, Proparacaine 0.5%.   Procedure Preparation included 5% betadine to ocular surface, eyelid speculum. A (32g)  needle was used.   Injection: 2 mg aflibercept 2 MG/0.05ML   Route: Intravitreal, Site: Right Eye   NDC: L6038910, Lot: [id number], Expiration date: 08/25/2023, Waste: 0 mL   Post-op Post injection exam found visual acuity of at least counting fingers. The patient tolerated the procedure well. There were no complications. The patient received written and verbal post procedure care education. Post injection medications were not given.   Notes              ASSESSMENT/PLAN:   ICD-10-CM   1. 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 Retinal artery branch occlusion of right eye  H34.231     3. Essential hypertension  I10     4. Hypertensive retinopathy of both eyes  H35.033     5. Combined forms of age-related cataract of left eye  H25.812     6. 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 BRVO with CME OD  - lost to follow up to 16 wks instead of 9 (08.10.23-11.30.23)  - lost to follow up from 6 weeks to 8 weeks 912.28.23 - 02.22.24)  - s/p IVA OD #1 (10.05.20), #2 (11.02.20), #3 (11.30.20), #4 (01.07.21), #5 (02.05.21), #6 (03.05.21), #7 (04.02.21), #8 (05.04.21), #9 (06.03.21), #10 (07.22.21), #11 (08.19.21), #12 (09.16.21) -- IVA resistance  - s/p IVE OD #1 (10.14.21), #2 (11.11.21), #3 (12.09.21), #4 (01.13.22), #5 (2.10.22), #6 (04.05.22), #7  (05.26.22), #8 (07.21.22), #9 (09.29.22), #10 (12.01.22), #11 (02.02.23), #12 (04.06.23), #13 (06.08.23), #14 (08.10.23), #15 (11.30.23), #16 (12.28.23), #17 (02.22.24), #18 (04.02.24), #19 (05.16.24), #20 (06.27.24)  - **history of increased IRF at 10 wk interval, noted on 9.29.22 visit and 16 wks on 11.30.23 visit**  - initial exam and OCT findings suggestive of a BRAO component contributing  - BCVA OD 20/20   - exam shows interval improvement in macular edema superior macula  - OCT shows Stable improvement in central IRF and SRF, focal pocket of IRF/IRHM SN macula -- slightly increased at 6 weeks  - recommend IVE OD #21 today, 08.13.24 for BRVO w/ CME with f/u back to 4-5 weeks   - pt wishes to proceed  - RBA of procedure discussed, questions answered  - informed consent obtained  - see procedure note  - Eylea[employer] benefits investigation started 9.16.21 -- approved for 2024 (Good Days Copay card)  - F/U 4-5 weeks -- DFE/OCT/possible injection, widefield OCT through schisis  3,4. Hypertensive retinopathy OU  - pt not formally diagnosed with HTN  - discussed importance of tight BP control and its relation to problems #1-2 above  - advised discussion with PCP and possible calibration of home BP cuff with PCP's  - monitor   5. Mixed form age related cataract OS  - The symptoms of cataract, surgical options, and treatments and risks were discussed with patient.  - discussed diagnosis and progression  - under the expert management of Dr. Zenaida Niece  6. Pseudophakia OD  - s/p CE/IOL (Dr. Zenaida Niece, 10.18.23)  - IOL in good position  - post - op CME + BRVO w/ CME   - monitor  7. Retinoschisis OU  - shallow schisis inferotemporal periphery OU -- stable  - stable on widefield OCT  - no RT/RD on scleral  depression  - pt asymptomatic  - monitor   Ophthalmic Meds Ordered this visit:  Meds ordered this encounter  Medications   aflibercept (EYLEA) SOLN 2 mg     Return for f/u 4-5 weeks BRVO OD, DFE,  OCT, Possible Injxn.  There are no Patient Instructions on file for this visit.   Explained the diagnoses, plan, and follow up with the patient and they expressed understanding.  Patient expressed understanding of the importance of proper follow up care.   This document serves as a record of services personally performed by Karie Chimera, MD, PhD. It was created on their behalf by Gerilyn Nestle, COT an ophthalmic technician. The creation of this record is the provider's dictation and/or activities during the visit.    Electronically signed by:  Charlette Caffey, COT  01/07/23 12:30 PM  This document serves as a record of services personally performed by Karie Chimera, MD, PhD. It was created on their behalf by Glee Arvin. Manson Passey, OA an ophthalmic technician. The creation of this record is the provider's dictation and/or activities during the visit.    Electronically signed by: Glee Arvin. Manson Passey, OA 01/07/23 12:30 PM  Karie Chimera, M.D., Ph.D. Diseases & Surgery of the Retina and Vitreous [facility]  I have reviewed the above documentation for accuracy and completeness, and I agree with the above. Karie Chimera, M.D., Ph.D. 01/07/23 12:31 PM    Abbreviations: M myopia (nearsighted); A astigmatism; H hyperopia (farsighted); P presbyopia; Mrx spectacle prescription;  CTL contact lenses; OD right eye; OS left eye; OU both eyes  XT exotropia; ET esotropia; PEK punctate epithelial keratitis; PEE punctate epithelial erosions; DES dry eye syndrome; MGD meibomian gland dysfunction; ATs artificial tears; PFAT's preservative free artificial tears; NSC nuclear sclerotic cataract; PSC posterior subcapsular cataract; ERM epi-retinal membrane; PVD posterior vitreous detachment; RD retinal detachment; DM diabetes mellitus; DR diabetic retinopathy; NPDR non-proliferative diabetic retinopathy; PDR proliferative diabetic retinopathy; CSME clinically significant macular edema;  DME diabetic macular edema; dbh dot blot hemorrhages; CWS cotton wool spot; POAG primary open angle glaucoma; C/D cup-to-disc ratio; HVF humphrey visual field; GVF goldmann visual field; OCT optical coherence tomography; IOP intraocular pressure; BRVO Branch retinal vein occlusion; CRVO central retinal vein occlusion; CRAO central retinal artery occlusion; BRAO branch retinal artery occlusion; RT retinal tear; SB scleral buckle; PPV pars plana vitrectomy; VH Vitreous hemorrhage; PRP panretinal laser photocoagulation; IVK intravitreal kenalog; VMT vitreomacular traction; MH Macular hole;  NVD neovascularization of the disc; NVE neovascularization elsewhere; AREDS age related eye disease study; ARMD age related macular degeneration; POAG primary open angle glaucoma; EBMD epithelial/anterior basement membrane dystrophy; ACIOL anterior chamber intraocular lens; IOL intraocular lens; PCIOL posterior chamber intraocular lens; Phaco/IOL phacoemulsification with intraocular lens placement; PRK photorefractive keratectomy; LASIK laser assisted in situ keratomileusis; HTN hypertension; DM diabetes mellitus; COPD chronic obstructive pulmonary disease

## 2023-01-07 ENCOUNTER — Ambulatory Visit (INDEPENDENT_AMBULATORY_CARE_PROVIDER_SITE_OTHER): Payer: 59 | Admitting: Ophthalmology

## 2023-01-07 ENCOUNTER — Encounter (INDEPENDENT_AMBULATORY_CARE_PROVIDER_SITE_OTHER): Payer: Self-pay | Admitting: Ophthalmology

## 2023-01-07 VITALS — BP 148/90 | HR 88

## 2023-01-07 DIAGNOSIS — I10 Essential (primary) hypertension: Secondary | ICD-10-CM

## 2023-01-07 DIAGNOSIS — H34231 Retinal artery branch occlusion, right eye: Secondary | ICD-10-CM

## 2023-01-07 DIAGNOSIS — H25812 Combined forms of age-related cataract, left eye: Secondary | ICD-10-CM

## 2023-01-07 DIAGNOSIS — H34831 Tributary (branch) retinal vein occlusion, right eye, with macular edema: Secondary | ICD-10-CM

## 2023-01-07 DIAGNOSIS — H35033 Hypertensive retinopathy, bilateral: Secondary | ICD-10-CM | POA: Diagnosis not present

## 2023-01-07 DIAGNOSIS — Z961 Presence of intraocular lens: Secondary | ICD-10-CM

## 2023-01-07 DIAGNOSIS — H33103 Unspecified retinoschisis, bilateral: Secondary | ICD-10-CM

## 2023-01-07 MED ORDER — AFLIBERCEPT 2MG/0.05ML IZ SOLN FOR KALEIDOSCOPE
2.0000 mg | INTRAVITREAL | Status: AC | PRN
Start: 2023-01-07 — End: 2023-01-07
  Administered 2023-01-07: 2 mg via INTRAVITREAL

## 2023-01-30 NOTE — Progress Notes (Signed)
[facility] - Clinic Note  02/13/2023     CHIEF COMPLAINT Patient presents for Retina Follow Up   HISTORY OF PRESENT ILLNESS: Jacqueline Greer is a 62 y.o. female who presents to the clinic today for:   HPI     Retina Follow Up   Patient presents with  CRVO/BRVO.  In right eye.  This started 5 weeks ago.  I, the attending physician,  performed the HPI with the patient and updated documentation appropriately.        Comments   Patient here for 5 weeks retina follow up for BRVO OD. Patient states vision doing ok. No eye pain. Started taking Metformin and Tresiba.      Last edited by Rennis Chris, MD on 02/13/2023 10:47 AM.    Pt states she just started insulin bc her blood sugar was in the 200s   Referring physician: Luciano Cutter, PA-C [address] [place] ,  [place] [address]  HISTORICAL INFORMATION:   Selected notes from the MEDICAL RECORD NUMBER Referred by Dr. Krista Blue for concern of BRVO   CURRENT MEDICATIONS: No current outpatient medications on file. (Ophthalmic Drugs)   No current facility-administered medications for this visit. (Ophthalmic Drugs)   Current Outpatient Medications (Other)  Medication Sig   insulin degludec (TRESIBA FLEXTOUCH) 100 UNIT/ML FlexTouch Pen Inject 6 Units into the skin daily.   metformin (FORTAMET) 500 MG (OSM) 24 hr tablet Take 500 tablets by mouth 1 day or 1 dose. One table before dinner.   omeprazole (PRILOSEC) 20 MG capsule Take 20 mg by mouth daily.   No current facility-administered medications for this visit. (Other)   REVIEW OF SYSTEMS: ROS   Positive for: Endocrine, Eyes Negative for: Constitutional, Gastrointestinal, Neurological, Skin, Genitourinary, Musculoskeletal, HENT, Cardiovascular, Respiratory, Psychiatric, Allergic/Imm, Heme/Lymph Last edited by Laddie Aquas, COA on 02/13/2023  8:38 AM.       ALLERGIES No Known Allergies  PAST MEDICAL HISTORY Past Medical History:   Diagnosis Date   Cataract    OU   Hypertensive retinopathy    OU   Past Surgical History:  Procedure Laterality Date   LAPAROSCOPIC TUBAL LIGATION  2003   FAMILY HISTORY Family History  Problem Relation Age of Onset   Diabetes Mother    Kidney failure Father    SOCIAL HISTORY Social History   Tobacco Use   Smoking status: Never   Smokeless tobacco: Never  Vaping Use   Vaping status: Never Used  Substance Use Topics   Alcohol use: No   Drug use: No       OPHTHALMIC EXAM:  Base Eye Exam     Visual Acuity (Snellen - Linear)       Right Left   Dist cc 20/20 20/30 -2   Dist ph cc  20/20 -2    Correction: Glasses         Tonometry (Tonopen, 8:36 AM)       Right Left   Pressure 15 16         Pupils       Dark Light Shape React APD   Right 4 3 Round Brisk None   Left 4 3 Round Brisk None         Visual Fields (Counting fingers)       Left Right    Full Full         Extraocular Movement       Right Left    Full, Ortho Full, Ortho  Neuro/Psych     Oriented x3: Yes   Mood/Affect: Normal         Dilation     Both eyes: 1.0% Mydriacyl, 2.5% Phenylephrine @ 8:36 AM           Slit Lamp and Fundus Exam     Slit Lamp Exam       Right Left   Lids/Lashes Dermatochalasis - upper lid Normal   Conjunctiva/Sclera Nasal and temporal Pinguecula, Melanosis Nasal and Pinguecula, Melanosis   Cornea well healed cataract wound, tear film debris, 2+ Punctate epithelial erosions trace Punctate epithelial erosions, trace tear film debris   Anterior Chamber deep and clear, no cell or flare Deep and clear   Iris Round and dilated, No NVI Round and dilated, No NVI   Lens PC IOL in good position, trace Posterior capsular opacification 2+ Nuclear sclerosis, 2-3+ Cortical cataract   Anterior Vitreous mild Vitreous syneresis, Posterior vitreous detachment mild Vitreous syneresis         Fundus Exam       Right Left   Disc 2+Pallor,  +PPP Pink and Sharp, temporal PPP, no heme   C/D Ratio 0.5 0.5   Macula good foveal reflex, stable improvement in central edema, focal edema with punctate exudates SN macula -- slightly increased Flat, Good foveal reflex, RPE mottling and clumping, No heme or edema, geographic area of hypopigmented RPE nasal and superior to fovea -- stable from prior   Vessels attenuated, Tortuous, severe attenuation ST arcades attenuated, Tortuous   Periphery Attached, DBH temporal periphery -- extension of BRVO, bullous schisis cavity IT periphery (from 0700-0900) - stable; focal pigmented CR scar at 0130 midzone, no RT/RD, WWOP temporally, pigmented cystoid degeneration inferiorly Attached, no heme, WWP temporal and inferior quadrants, shallow schisis IT periphery           Refraction     Wearing Rx       Sphere Cylinder Axis Add   Right -0.50 +1.00 152 +2.25   Left -4.50 +0.75 019 +2.25           IMAGING AND PROCEDURES  Imaging and Procedures for @TODAY @  OCT, Retina - OU - Both Eyes       Right Eye Quality was good. Central Foveal Thickness: 231. Progression has worsened. Findings include normal foveal contour, no SRF, intraretinal hyper-reflective material, intraretinal fluid (Stable improvement in central IRF and SRF, persistent pocket of IRF/IRHM SN macula -- slightly increased, partial PVD, focal schisis cavity IT periphery caught on widefield).   Left Eye Quality was good. Central Foveal Thickness: 236. Progression has been stable. Findings include normal foveal contour, no IRF, no SRF (Partial PVD, Shallow IT Retinoschisis).   Notes *Images captured and stored on drive  Diagnosis / Impression:  OD: Stable improvement in central IRF and SRF, persistent pocket of IRF/IRHM SN macula -- slightly increased, partal PVD OS: NFP, no IRF/SRF; Partial PVD, shallow IT retinoschisis   Clinical management:  See below  Abbreviations: NFP - Normal foveal profile. CME - cystoid macular edema.  PED - pigment epithelial detachment. IRF - intraretinal fluid. SRF - subretinal fluid. EZ - ellipsoid zone. ERM - epiretinal membrane. ORA - outer retinal atrophy. ORT - outer retinal tubulation. SRHM - subretinal hyper-reflective material      Intravitreal Injection, Pharmacologic Agent - OD - Right Eye       Time Out 02/13/2023. 9:50 AM. Confirmed correct patient, procedure, site, and patient consented.   Anesthesia Topical anesthesia was used. Anesthetic medications included  Lidocaine 2%, Proparacaine 0.5%.   Procedure Preparation included 5% betadine to ocular surface, eyelid speculum. A (32g) needle was used.   Injection: 2 mg aflibercept 2 MG/0.05ML   Route: Intravitreal, Site: Right Eye   NDC: L6038910, Lot: [id number], Expiration date: 03/26/2024, Waste: 0 mL   Post-op Post injection exam found visual acuity of at least counting fingers. The patient tolerated the procedure well. There were no complications. The patient received written and verbal post procedure care education. Post injection medications were not given.   Notes             ASSESSMENT/PLAN:   ICD-10-CM   1. 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 Retinal artery branch occlusion of right eye  H34.231     3. Diabetes mellitus type 2 without retinopathy (HCC)  E11.9     4. Long term (current) use of oral hypoglycemic drugs  Z79.84     5. Current use of insulin (HCC)  Z79.4     6. Essential hypertension  I10     7. Hypertensive retinopathy of both eyes  H35.033     8. Combined forms of age-related cataract of left eye  H25.812     9. 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 BRVO with CME OD  - lost to follow up to 16 wks instead of 9 (08.10.23-11.30.23)  - lost to follow up from 6 weeks to 8  weeks 912.28.23 - 02.22.24)  - s/p IVA OD #1 (10.05.20), #2 (11.02.20), #3 (11.30.20), #4 (01.07.21), #5 (02.05.21), #6 (03.05.21), #7 (04.02.21), #8 (05.04.21), #9 (06.03.21), #10 (07.22.21), #11 (08.19.21), #12 (09.16.21) -- IVA resistance  - s/p IVE OD #1 (10.14.21), #2 (11.11.21), #3 (12.09.21), #4 (01.13.22), #5 (2.10.22), #6 (04.05.22), #7 (05.26.22), #8 (07.21.22), #9 (09.29.22), #10 (12.01.22), #11 (02.02.23), #12 (04.06.23), #13 (06.08.23), #14 (08.10.23), #15 (11.30.23), #16 (12.28.23), #17 (02.22.24), #18 (04.02.24), #19 (05.16.24), #20 (06.27.24), #21 (08.13.24)  **history of increased peripapillary IRF at 5 wks, noted on 09.19.24 visit**  **history of increased IRF at 10 wk interval, noted on 9.29.22 visit and 16 wks on 11.30.23 visit**  - initial exam and OCT findings suggestive of a BRAO component contributing  - BCVA OD 20/20   - exam shows interval improvement in macular edema superior macula  - OCT shows Stable improvement in central IRF and SRF, focal pocket of IRF/IRHM SN macula -- slightly increased at 5 weeks  **discussed decreased efficacy / resistance to Eylea and potential benefit of switching medication*  - recommend IVE OD #22 today, 09.19.24 for BRVO w/ CME with f/u back to 4 weeks   - pt wishes to proceed  - RBA of procedure discussed, questions answered  - informed consent obtained  - see procedure note  - Eylea4U benefits investigation started 9.16.21 -- approved for 2024 (Good Days Copay card)  - will check [employer] auth for next viist  - F/U 4 weeks -- DFE/OCT/possible injection, widefield OCT through schisis  3-5. Diabetes mellitus, type 2 without retinopathy - The incidence, risk factors for progression, natural history and treatment options for diabetic retinopathy  were discussed with patient.   - The need for close monitoring of blood glucose, blood pressure, and serum lipids, avoiding cigarette or any  type of tobacco, and the need for long term follow up was  also discussed with patient. - f/u in 1 year, sooner prn  6,7. Hypertensive retinopathy OU  - pt not formally diagnosed with HTN  - discussed importance of tight BP control and its relation to problems #1-2 above  - advised discussion with PCP and possible calibration of home BP cuff with PCP's  - monitor   8. Mixed form age related cataract OS  - The symptoms of cataract, surgical options, and treatments and risks were discussed with patient.  - discussed diagnosis and progression  - under the expert management of Dr. Zenaida Niece  9. Pseudophakia OD  - s/p CE/IOL (Dr. Zenaida Niece, 10.18.23)  - IOL in good position  - post - op CME + BRVO w/ CME   - monitor  10. Retinoschisis OU  - shallow schisis inferotemporal periphery OU -- stable  - stable on widefield OCT  - no RT/RD on scleral depression  - pt asymptomatic  - monitor   Ophthalmic Meds Ordered this visit:  Meds ordered this encounter  Medications   aflibercept (EYLEA) SOLN 2 mg     Return in about 4 weeks (around 03/13/2023) for f/u BRVO OD, DFE, OCT.  There are no Patient Instructions on file for this visit.   Explained the diagnoses, plan, and follow up with the patient and they expressed understanding.  Patient expressed understanding of the importance of proper follow up care.   This document serves as a record of services personally performed by Karie Chimera, MD, PhD. It was created on their behalf by Glee Arvin. Manson Passey, OA an ophthalmic technician. The creation of this record is the provider's dictation and/or activities during the visit.    Electronically signed by: Glee Arvin. Manson Passey, OA 02/13/23 4:23 PM  Karie Chimera, M.D., Ph.D. Diseases & Surgery of the Retina and Vitreous [facility]  I have reviewed the above documentation for accuracy and completeness, and I agree with the above. Karie Chimera, M.D., Ph.D. 02/13/23 4:26 PM   Abbreviations: M myopia (nearsighted); A astigmatism; H  hyperopia (farsighted); P presbyopia; Mrx spectacle prescription;  CTL contact lenses; OD right eye; OS left eye; OU both eyes  XT exotropia; ET esotropia; PEK punctate epithelial keratitis; PEE punctate epithelial erosions; DES dry eye syndrome; MGD meibomian gland dysfunction; ATs artificial tears; PFAT's preservative free artificial tears; NSC nuclear sclerotic cataract; PSC posterior subcapsular cataract; ERM epi-retinal membrane; PVD posterior vitreous detachment; RD retinal detachment; DM diabetes mellitus; DR diabetic retinopathy; NPDR non-proliferative diabetic retinopathy; PDR proliferative diabetic retinopathy; CSME clinically significant macular edema; DME diabetic macular edema; dbh dot blot hemorrhages; CWS cotton wool spot; POAG primary open angle glaucoma; C/D cup-to-disc ratio; HVF humphrey visual field; GVF goldmann visual field; OCT optical coherence tomography; IOP intraocular pressure; BRVO Branch retinal vein occlusion; CRVO central retinal vein occlusion; CRAO central retinal artery occlusion; BRAO branch retinal artery occlusion; RT retinal tear; SB scleral buckle; PPV pars plana vitrectomy; VH Vitreous hemorrhage; PRP panretinal laser photocoagulation; IVK intravitreal kenalog; VMT vitreomacular traction; MH Macular hole;  NVD neovascularization of the disc; NVE neovascularization elsewhere; AREDS age related eye disease study; ARMD age related macular degeneration; POAG primary open angle glaucoma; EBMD epithelial/anterior basement membrane dystrophy; ACIOL anterior chamber intraocular lens; IOL intraocular lens; PCIOL posterior chamber intraocular lens; Phaco/IOL phacoemulsification with intraocular lens placement; PRK photorefractive keratectomy; LASIK laser assisted in situ keratomileusis; HTN hypertension; DM diabetes mellitus; COPD chronic obstructive pulmonary disease

## 2023-02-13 ENCOUNTER — Ambulatory Visit (INDEPENDENT_AMBULATORY_CARE_PROVIDER_SITE_OTHER): Payer: 59 | Admitting: Ophthalmology

## 2023-02-13 ENCOUNTER — Encounter (INDEPENDENT_AMBULATORY_CARE_PROVIDER_SITE_OTHER): Payer: Self-pay | Admitting: Ophthalmology

## 2023-02-13 DIAGNOSIS — Z7984 Long term (current) use of oral hypoglycemic drugs: Secondary | ICD-10-CM | POA: Diagnosis not present

## 2023-02-13 DIAGNOSIS — H34831 Tributary (branch) retinal vein occlusion, right eye, with macular edema: Secondary | ICD-10-CM | POA: Diagnosis not present

## 2023-02-13 DIAGNOSIS — H33103 Unspecified retinoschisis, bilateral: Secondary | ICD-10-CM

## 2023-02-13 DIAGNOSIS — E119 Type 2 diabetes mellitus without complications: Secondary | ICD-10-CM | POA: Diagnosis not present

## 2023-02-13 DIAGNOSIS — I10 Essential (primary) hypertension: Secondary | ICD-10-CM

## 2023-02-13 DIAGNOSIS — H35033 Hypertensive retinopathy, bilateral: Secondary | ICD-10-CM

## 2023-02-13 DIAGNOSIS — Z961 Presence of intraocular lens: Secondary | ICD-10-CM

## 2023-02-13 DIAGNOSIS — H25812 Combined forms of age-related cataract, left eye: Secondary | ICD-10-CM

## 2023-02-13 DIAGNOSIS — H34231 Retinal artery branch occlusion, right eye: Secondary | ICD-10-CM | POA: Diagnosis not present

## 2023-02-13 DIAGNOSIS — Z794 Long term (current) use of insulin: Secondary | ICD-10-CM

## 2023-02-13 MED ORDER — AFLIBERCEPT 2MG/0.05ML IZ SOLN FOR KALEIDOSCOPE
2.0000 mg | INTRAVITREAL | Status: AC | PRN
Start: 2023-02-13 — End: 2023-02-13
  Administered 2023-02-13: 2 mg via INTRAVITREAL

## 2023-03-11 LAB — COLOGUARD

## 2023-03-11 LAB — EXTERNAL GENERIC LAB PROCEDURE

## 2023-03-13 ENCOUNTER — Encounter (INDEPENDENT_AMBULATORY_CARE_PROVIDER_SITE_OTHER): Payer: 59 | Admitting: Ophthalmology

## 2023-03-13 DIAGNOSIS — H34831 Tributary (branch) retinal vein occlusion, right eye, with macular edema: Secondary | ICD-10-CM

## 2023-03-13 DIAGNOSIS — H33103 Unspecified retinoschisis, bilateral: Secondary | ICD-10-CM

## 2023-03-13 DIAGNOSIS — H25812 Combined forms of age-related cataract, left eye: Secondary | ICD-10-CM

## 2023-03-13 DIAGNOSIS — H35033 Hypertensive retinopathy, bilateral: Secondary | ICD-10-CM

## 2023-03-13 DIAGNOSIS — E119 Type 2 diabetes mellitus without complications: Secondary | ICD-10-CM

## 2023-03-13 DIAGNOSIS — Z961 Presence of intraocular lens: Secondary | ICD-10-CM

## 2023-03-13 DIAGNOSIS — H34231 Retinal artery branch occlusion, right eye: Secondary | ICD-10-CM

## 2023-03-13 DIAGNOSIS — Z794 Long term (current) use of insulin: Secondary | ICD-10-CM

## 2023-03-13 DIAGNOSIS — Z7984 Long term (current) use of oral hypoglycemic drugs: Secondary | ICD-10-CM

## 2023-03-13 DIAGNOSIS — I10 Essential (primary) hypertension: Secondary | ICD-10-CM

## 2023-03-13 NOTE — Progress Notes (Signed)
[facility] - Clinic Note  03/25/2023     CHIEF COMPLAINT Patient presents for Retina Follow Up   HISTORY OF PRESENT ILLNESS: Jacqueline Greer is a 62 y.o. female who presents to the clinic today for:   HPI     Retina Follow Up   Patient presents with  CRVO/BRVO.  In right eye.  This started 4 weeks ago.  Duration of 4 weeks.  Since onset it is stable.  I, the attending physician,  performed the HPI with the patient and updated documentation appropriately.        Comments   4 week retina follow up BRVO OD and IVE OD pt is reporting no vision changes noticed she denies any flashes or floaters       Last edited by Rennis Chris, MD on 03/25/2023  9:04 AM.      Referring physician: Luciano Cutter, PA-C [address] [place] ,  [place] [address]  HISTORICAL INFORMATION:   Selected notes from the MEDICAL RECORD NUMBER Referred by Dr. Krista Blue for concern of BRVO   CURRENT MEDICATIONS: No current outpatient medications on file. (Ophthalmic Drugs)   No current facility-administered medications for this visit. (Ophthalmic Drugs)   Current Outpatient Medications (Other)  Medication Sig   insulin degludec (TRESIBA FLEXTOUCH) 100 UNIT/ML FlexTouch Pen Inject 6 Units into the skin daily.   metformin (FORTAMET) 500 MG (OSM) 24 hr tablet Take 500 tablets by mouth 1 day or 1 dose. One table before dinner.   omeprazole (PRILOSEC) 20 MG capsule Take 20 mg by mouth daily.   No current facility-administered medications for this visit. (Other)   REVIEW OF SYSTEMS: ROS   Positive for: Endocrine, Eyes Negative for: Constitutional, Gastrointestinal, Neurological, Skin, Genitourinary, Musculoskeletal, HENT, Cardiovascular, Respiratory, Psychiatric, Allergic/Imm, Heme/Lymph Last edited by Etheleen Mayhew, COT on 03/25/2023  8:06 AM.     ALLERGIES No Known Allergies  PAST MEDICAL HISTORY Past Medical History:  Diagnosis Date   Cataract    OU    Hypertensive retinopathy    OU   Past Surgical History:  Procedure Laterality Date   LAPAROSCOPIC TUBAL LIGATION  2003   FAMILY HISTORY Family History  Problem Relation Age of Onset   Diabetes Mother    Kidney failure Father    SOCIAL HISTORY Social History   Tobacco Use   Smoking status: Never   Smokeless tobacco: Never  Vaping Use   Vaping status: Never Used  Substance Use Topics   Alcohol use: No   Drug use: No       OPHTHALMIC EXAM:  Base Eye Exam     Visual Acuity (Snellen - Linear)       Right Left   Dist cc 20/20 20/20    Correction: Glasses         Tonometry (Tonopen, 8:11 AM)       Right Left   Pressure 13 14         Pupils       Pupils Dark Light Shape React APD   Right PERRL 4 3 Round Brisk None   Left PERRL 4 3 Round Brisk None         Visual Fields       Left Right    Full Full         Extraocular Movement       Right Left    Full, Ortho Full, Ortho         Neuro/Psych  Oriented x3: Yes   Mood/Affect: Normal         Dilation     Both eyes: 2.5% Phenylephrine @ 8:12 AM           Slit Lamp and Fundus Exam     Slit Lamp Exam       Right Left   Lids/Lashes Dermatochalasis - upper lid Normal   Conjunctiva/Sclera Nasal and temporal Pinguecula, Melanosis Nasal and Pinguecula, Melanosis   Cornea well healed cataract wound, tear film debris, 2+ Punctate epithelial erosions trace Punctate epithelial erosions, trace tear film debris   Anterior Chamber deep and clear, no cell or flare Deep and clear   Iris Round and dilated, No NVI Round and dilated, No NVI   Lens PC IOL in good position, trace Posterior capsular opacification 2+ Nuclear sclerosis, 2-3+ Cortical cataract   Anterior Vitreous mild Vitreous syneresis, Posterior vitreous detachment mild Vitreous syneresis         Fundus Exam       Right Left   Disc 2+Pallor, +PPP Pink and Sharp, temporal PPP, no heme   C/D Ratio 0.5 0.5   Macula good  foveal reflex, stable improvement in central edema, focal edema with punctate exudates SN macula -- slightly improved Flat, Good foveal reflex, RPE mottling and clumping, No heme or edema, geographic area of hypopigmented RPE nasal and superior to fovea -- stable from prior   Vessels attenuated, Tortuous, severe attenuation ST arcades attenuated, Tortuous   Periphery Attached, DBH temporal periphery -- extension of BRVO, bullous schisis cavity IT periphery (from 0700-0900) - stable; focal pigmented CR scar at 0130 midzone, no RT/RD, WWOP temporally, pigmented cystoid degeneration inferiorly Attached, no heme, WWP temporal and inferior quadrants, shallow schisis IT periphery           Refraction     Wearing Rx       Sphere Cylinder Axis Add   Right -0.50 +1.00 152 +2.25   Left -4.50 +0.75 019 +2.25           IMAGING AND PROCEDURES  Imaging and Procedures for @TODAY @  OCT, Retina - OU - Both Eyes       Right Eye Quality was good. Central Foveal Thickness: 234. Progression has improved. Findings include normal foveal contour, no SRF, intraretinal hyper-reflective material, intraretinal fluid (Stable improvement in central IRF and SRF, persistent pocket of IRF/IRHM SN macula -- improved, partial PVD, focal schisis cavity IT periphery caught on widefield -- not imaged today).   Left Eye Quality was good. Central Foveal Thickness: 242. Progression has been stable. Findings include normal foveal contour, no IRF, no SRF (Partial PVD, Shallow IT Retinoschisis -- not imaged today).   Notes *Images captured and stored on drive  Diagnosis / Impression:  OD: Stable improvement in central IRF and SRF, persistent pocket of IRF/IRHM SN macula -- improved, partial PVD, focal schisis cavity IT periphery caught on widefield -- not imaged today OS: NFP, no IRF/SRF; Partial PVD, shallow IT retinoschisis -- not imaged today  Clinical management:  See below  Abbreviations: NFP - Normal foveal  profile. CME - cystoid macular edema. PED - pigment epithelial detachment. IRF - intraretinal fluid. SRF - subretinal fluid. EZ - ellipsoid zone. ERM - epiretinal membrane. ORA - outer retinal atrophy. ORT - outer retinal tubulation. SRHM - subretinal hyper-reflective material      Intravitreal Injection, Pharmacologic Agent - OD - Right Eye       Time Out 03/25/2023. 8:32 AM. Confirmed correct patient, procedure,  site, and patient consented.   Anesthesia Topical anesthesia was used. Anesthetic medications included Lidocaine 2%, Proparacaine 0.5%.   Procedure Preparation included 5% betadine to ocular surface, eyelid speculum. A (32g) needle was used.   Injection: 6 mg faricimab-svoa 6 MG/0.05ML   Route: Intravitreal, Site: Right Eye   NDC: [id number], Lot: [id number], Expiration date: 08/24/2024, Waste: 0 mL   Post-op Post injection exam found visual acuity of at least counting fingers. The patient tolerated the procedure well. There were no complications. The patient received written and verbal post procedure care education. Post injection medications were not given.   Notes             ASSESSMENT/PLAN:   ICD-10-CM   1. Branch retinal vein occlusion of right eye with macular edema  H34.8310 OCT, Retina - OU - Both Eyes    Intravitreal Injection, Pharmacologic Agent - OD - Right Eye    faricimab-svoa (VABYSMO) 6mg /0.78mL intravitreal injection    2. Retinal artery branch occlusion of right eye  H34.231     3. Diabetes mellitus type 2 without retinopathy (HCC)  E11.9     4. Long term (current) use of oral hypoglycemic drugs  Z79.84     5. Current use of insulin (HCC)  Z79.4     6. Essential hypertension  I10     7. Hypertensive retinopathy of both eyes  H35.033     8. Combined forms of age-related cataract of left eye  H25.812     9. 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 BRVO with CME OD  - lost to follow up to 16 wks instead  of 9 (08.10.23-11.30.23)  - lost to follow up from 6 weeks to 8 weeks 912.28.23 - 02.22.24)  - s/p IVA OD #1 (10.05.20), #2 (11.02.20), #3 (11.30.20), #4 (01.07.21), #5 (02.05.21), #6 (03.05.21), #7 (04.02.21), #8 (05.04.21), #9 (06.03.21), #10 (07.22.21), #11 (08.19.21), #12 (09.16.21) -- IVA resistance  - s/p IVE OD #1 (10.14.21), #2 (11.11.21), #3 (12.09.21), #4 (01.13.22), #5 (2.10.22), #6 (04.05.22), #7 (05.26.22), #8 (07.21.22), #9 (09.29.22), #10 (12.01.22), #11 (02.02.23), #12 (04.06.23), #13 (06.08.23), #14 (08.10.23), #15 (11.30.23), #16 (12.28.23), #17 (02.22.24), #18 (04.02.24), #19 (05.16.24), #20 (06.27.24), #21 (08.13.24), #22 (09.19.24)  **history of increased peripapillary IRF at 5 wks, noted on 09.19.24 visit**  **history of increased IRF at 10 wk interval, noted on 9.29.22 visit and 16 wks on 11.30.23 visit**  - initial exam and OCT findings suggestive of a BRAO component contributing  - BCVA OD 20/20   - exam shows interval improvement in macular edema superior macula  - OCT shows OD: Stable improvement in central IRF and SRF, persistent pocket of IRF/IRHM SN macula -- improved, partial PVD, focal schisis cavity IT periphery caught on widefield -- not imaged today at 5 weeks  **discussed decreased efficacy / resistance to Eylea and potential benefit of switching medication**   - recommend switching to IVV OD #1 today, 10.29.24 for BRVO w/ CME with f/u at 5 weeks again   - pt wishes to proceed  - RBA of procedure discussed, questions answered  - Vabysmo informed consent obtained and signed, 10.29.24  - see procedure note  - Eylea4U benefits investigation started 9.16.21 -- approved for 2024 (Good Days Copay card)  - Elinor Dodge is approved for 2024  - F/U 4 weeks -- DFE/OCT/possible injection, widefield OCT through schisis  3-5. Diabetes mellitus, type 2 without retinopathy - The incidence, risk factors  for progression, natural history and treatment options for diabetic  retinopathy  were discussed with patient.   - The need for close monitoring of blood glucose, blood pressure, and serum lipids, avoiding cigarette or any type of tobacco, and the need for long term follow up was also discussed with patient. - f/u in 1 year, sooner prn  6,7. Hypertensive retinopathy OU  - pt not formally diagnosed with HTN  - discussed importance of tight BP control and its relation to problems #1-2 above  - advised discussion with PCP and possible calibration of home BP cuff with PCP's  - monitor   8. Mixed form age related cataract OS  - The symptoms of cataract, surgical options, and treatments and risks were discussed with patient.  - discussed diagnosis and progression  - under the expert management of Dr. Zenaida Niece  9. Pseudophakia OD  - s/p CE/IOL (Dr. Zenaida Niece, 10.18.23)  - IOL in good position  - post - op CME + BRVO w/ CME   - monitor  10. Retinoschisis OU  - shallow schisis inferotemporal periphery OU -- stable  - stable on widefield OCT  - no RT/RD on scleral depression  - pt asymptomatic  - monitor   Ophthalmic Meds Ordered this visit:  Meds ordered this encounter  Medications   faricimab-svoa (VABYSMO) 6mg /0.31mL intravitreal injection     Return in about 5 weeks (around 04/29/2023) for f/u BRVO OD, DFE, OCT.  There are no Patient Instructions on file for this visit.   Explained the diagnoses, plan, and follow up with the patient and they expressed understanding.  Patient expressed understanding of the importance of proper follow up care.   This document serves as a record of services personally performed by Karie Chimera, MD, PhD. It was created on their behalf by Charlette Caffey, COT an ophthalmic technician. The creation of this record is the provider's dictation and/or activities during the visit.    Electronically signed by:  Charlette Caffey, COT  03/25/23 9:21 AM  This document serves as a record of services personally performed by Karie Chimera, MD, PhD. It was created on their behalf by Glee Arvin. Manson Passey, OA an ophthalmic technician. The creation of this record is the provider's dictation and/or activities during the visit.    Electronically signed by: Glee Arvin. Manson Passey, OA 03/25/23 9:21 AM   Karie Chimera, M.D., Ph.D. Diseases & Surgery of the Retina and Vitreous [facility]  I have reviewed the above documentation for accuracy and completeness, and I agree with the above. Karie Chimera, M.D., Ph.D. 03/25/23 9:25 AM  Abbreviations: M myopia (nearsighted); A astigmatism; H hyperopia (farsighted); P presbyopia; Mrx spectacle prescription;  CTL contact lenses; OD right eye; OS left eye; OU both eyes  XT exotropia; ET esotropia; PEK punctate epithelial keratitis; PEE punctate epithelial erosions; DES dry eye syndrome; MGD meibomian gland dysfunction; ATs artificial tears; PFAT's preservative free artificial tears; NSC nuclear sclerotic cataract; PSC posterior subcapsular cataract; ERM epi-retinal membrane; PVD posterior vitreous detachment; RD retinal detachment; DM diabetes mellitus; DR diabetic retinopathy; NPDR non-proliferative diabetic retinopathy; PDR proliferative diabetic retinopathy; CSME clinically significant macular edema; DME diabetic macular edema; dbh dot blot hemorrhages; CWS cotton wool spot; POAG primary open angle glaucoma; C/D cup-to-disc ratio; HVF humphrey visual field; GVF goldmann visual field; OCT optical coherence tomography; IOP intraocular pressure; BRVO Branch retinal vein occlusion; CRVO central retinal vein occlusion; CRAO central retinal artery occlusion; BRAO branch retinal artery  occlusion; RT retinal tear; SB scleral buckle; PPV pars plana vitrectomy; VH Vitreous hemorrhage; PRP panretinal laser photocoagulation; IVK intravitreal kenalog; VMT vitreomacular traction; MH Macular hole;  NVD neovascularization of the disc; NVE neovascularization elsewhere; AREDS age related eye  disease study; ARMD age related macular degeneration; POAG primary open angle glaucoma; EBMD epithelial/anterior basement membrane dystrophy; ACIOL anterior chamber intraocular lens; IOL intraocular lens; PCIOL posterior chamber intraocular lens; Phaco/IOL phacoemulsification with intraocular lens placement; PRK photorefractive keratectomy; LASIK laser assisted in situ keratomileusis; HTN hypertension; DM diabetes mellitus; COPD chronic obstructive pulmonary disease

## 2023-03-25 ENCOUNTER — Encounter (INDEPENDENT_AMBULATORY_CARE_PROVIDER_SITE_OTHER): Payer: Self-pay | Admitting: Ophthalmology

## 2023-03-25 ENCOUNTER — Ambulatory Visit (INDEPENDENT_AMBULATORY_CARE_PROVIDER_SITE_OTHER): Payer: 59 | Admitting: Ophthalmology

## 2023-03-25 DIAGNOSIS — H34231 Retinal artery branch occlusion, right eye: Secondary | ICD-10-CM | POA: Diagnosis not present

## 2023-03-25 DIAGNOSIS — H34831 Tributary (branch) retinal vein occlusion, right eye, with macular edema: Secondary | ICD-10-CM

## 2023-03-25 DIAGNOSIS — E119 Type 2 diabetes mellitus without complications: Secondary | ICD-10-CM

## 2023-03-25 DIAGNOSIS — Z961 Presence of intraocular lens: Secondary | ICD-10-CM

## 2023-03-25 DIAGNOSIS — H33103 Unspecified retinoschisis, bilateral: Secondary | ICD-10-CM

## 2023-03-25 DIAGNOSIS — H35033 Hypertensive retinopathy, bilateral: Secondary | ICD-10-CM

## 2023-03-25 DIAGNOSIS — Z7984 Long term (current) use of oral hypoglycemic drugs: Secondary | ICD-10-CM | POA: Diagnosis not present

## 2023-03-25 DIAGNOSIS — Z794 Long term (current) use of insulin: Secondary | ICD-10-CM

## 2023-03-25 DIAGNOSIS — H25812 Combined forms of age-related cataract, left eye: Secondary | ICD-10-CM

## 2023-03-25 DIAGNOSIS — I10 Essential (primary) hypertension: Secondary | ICD-10-CM

## 2023-03-25 MED ORDER — FARICIMAB-SVOA 6 MG/0.05ML IZ SOSY
6.0000 mg | PREFILLED_SYRINGE | INTRAVITREAL | Status: AC | PRN
Start: 2023-03-25 — End: 2023-03-25
  Administered 2023-03-25: 6 mg via INTRAVITREAL

## 2023-04-29 NOTE — Progress Notes (Signed)
[facility] - Clinic Note  05/01/2023     CHIEF COMPLAINT Patient presents for Retina Follow Up   HISTORY OF PRESENT ILLNESS: Jacqueline Greer is a 62 y.o. female who presents to the clinic today for:   HPI     Retina Follow Up   Patient presents with  CRVO/BRVO.  In right eye.  This started 5 weeks ago.  Duration of 5 weeks.  Since onset it is stable.  I, the attending physician,  performed the HPI with the patient and updated documentation appropriately.        Comments   5 week retina follow up IVV OD pt is reporting no vision changes noticed she has been having some watering she is not any gtts at this time pt denies any flashes or floaters last blood sugar reading 131 today      Last edited by Rennis Chris, MD on 05/01/2023  8:59 AM.    Pt states she has not noticed any difference in vision since she received the first injection of Vabysmo, but she did feel like her eye was a little swollen after the injection   Referring physician: Luciano Cutter, PA-C [address] [place] ,  [place] [address]  HISTORICAL INFORMATION:   Selected notes from the MEDICAL RECORD NUMBER Referred by Dr. Krista Blue for concern of BRVO   CURRENT MEDICATIONS: No current outpatient medications on file. (Ophthalmic Drugs)   No current facility-administered medications for this visit. (Ophthalmic Drugs)   Current Outpatient Medications (Other)  Medication Sig   insulin degludec (TRESIBA FLEXTOUCH) 100 UNIT/ML FlexTouch Pen Inject 6 Units into the skin daily.   metformin (FORTAMET) 500 MG (OSM) 24 hr tablet Take 500 tablets by mouth 1 day or 1 dose. One table before dinner.   omeprazole (PRILOSEC) 20 MG capsule Take 20 mg by mouth daily.   No current facility-administered medications for this visit. (Other)   REVIEW OF SYSTEMS: ROS   Positive for: Endocrine, Eyes Negative for: Constitutional, Gastrointestinal, Neurological, Skin, Genitourinary, Musculoskeletal,  HENT, Cardiovascular, Respiratory, Psychiatric, Allergic/Imm, Heme/Lymph Last edited by Etheleen Mayhew, COT on 05/01/2023  8:17 AM.      ALLERGIES No Known Allergies  PAST MEDICAL HISTORY Past Medical History:  Diagnosis Date   Cataract    OU   Hypertensive retinopathy    OU   Past Surgical History:  Procedure Laterality Date   LAPAROSCOPIC TUBAL LIGATION  2003   FAMILY HISTORY Family History  Problem Relation Age of Onset   Diabetes Mother    Kidney failure Father    SOCIAL HISTORY Social History   Tobacco Use   Smoking status: Never   Smokeless tobacco: Never  Vaping Use   Vaping status: Never Used  Substance Use Topics   Alcohol use: No   Drug use: No       OPHTHALMIC EXAM:  Base Eye Exam     Visual Acuity (Snellen - Linear)       Right Left   Dist cc 20/20 20/20    Correction: Glasses         Tonometry (Tonopen, 8:24 AM)       Right Left   Pressure 10 12         Pupils       Pupils Dark Light Shape React APD   Right PERRL 4 3 Round Brisk None   Left PERRL 4 3 Round Brisk None         Visual Fields  Left Right    Full Full         Extraocular Movement       Right Left    Full, Ortho Full, Ortho         Neuro/Psych     Oriented x3: Yes   Mood/Affect: Normal         Dilation     Right eye: 2.5% Phenylephrine @ 8:24 AM           Slit Lamp and Fundus Exam     Slit Lamp Exam       Right Left   Lids/Lashes Dermatochalasis - upper lid Normal   Conjunctiva/Sclera Nasal and temporal Pinguecula, Melanosis Nasal and Pinguecula, Melanosis   Cornea well healed cataract wound, tear film debris, 2+ Punctate epithelial erosions trace Punctate epithelial erosions, trace tear film debris   Anterior Chamber deep and clear, no cell or flare Deep and clear   Iris Round and dilated, No NVI Round and dilated, No NVI   Lens PC IOL in good position, trace Posterior capsular opacification 2+ Nuclear sclerosis, 2-3+  Cortical cataract   Anterior Vitreous mild Vitreous syneresis, Posterior vitreous detachment mild Vitreous syneresis         Fundus Exam       Right Left   Disc 2+Pallor, +PPP Pink and Sharp, temporal PPP, no heme   C/D Ratio 0.5 0.5   Macula good foveal reflex, stable improvement in central edema, focal edema with punctate exudates SN macula -- slightly improved, focal punctate exudates temporal macula Flat, Good foveal reflex, RPE mottling and clumping, No heme or edema, geographic area of hypopigmented RPE nasal and superior to fovea -- stable from prior   Vessels attenuated, Tortuous, severe attenuation ST arcades attenuated, Tortuous   Periphery Attached, DBH temporal periphery -- extension of BRVO, bullous schisis cavity IT periphery (from 0700-0900) - stable; focal pigmented CR scar at 0130 midzone, no RT/RD, WWOP temporally, pigmented cystoid degeneration inferiorly Attached, no heme, WWP temporal and inferior quadrants, shallow schisis IT periphery           Refraction     Wearing Rx       Sphere Cylinder Axis Add   Right -0.50 +1.00 152 +2.25   Left -4.50 +0.75 019 +2.25           IMAGING AND PROCEDURES  Imaging and Procedures for @TODAY @  OCT, Retina - OU - Both Eyes       Right Eye Quality was good. Central Foveal Thickness: 232. Progression has improved. Findings include normal foveal contour, no SRF, intraretinal hyper-reflective material, intraretinal fluid (Stable improvement in central IRF and SRF, persistent pocket of IRF/IRHM SN macula -- slightly improved, partial PVD, focal schisis cavity IT periphery caught on widefield -- not imaged today).   Left Eye Quality was good. Central Foveal Thickness: 244. Progression has been stable. Findings include normal foveal contour, no IRF, no SRF (Partial PVD, Shallow IT Retinoschisis -- not imaged today).   Notes *Images captured and stored on drive  Diagnosis / Impression:  OD: Stable improvement in central  IRF and SRF, persistent pocket of IRF/IRHM SN macula -- slightly improved, partial PVD, focal schisis cavity IT periphery caught on widefield -- not imaged today OS: NFP, no IRF/SRF; Partial PVD, shallow IT retinoschisis -- not imaged today  Clinical management:  See below  Abbreviations: NFP - Normal foveal profile. CME - cystoid macular edema. PED - pigment epithelial detachment. IRF - intraretinal fluid. SRF - subretinal fluid.  EZ - ellipsoid zone. ERM - epiretinal membrane. ORA - outer retinal atrophy. ORT - outer retinal tubulation. SRHM - subretinal hyper-reflective material      Intravitreal Injection, Pharmacologic Agent - OD - Right Eye       Time Out 05/01/2023. 8:51 AM. Confirmed correct patient, procedure, site, and patient consented.   Anesthesia Topical anesthesia was used. Anesthetic medications included Lidocaine 2%, Proparacaine 0.5%.   Procedure Preparation included 5% betadine to ocular surface, eyelid speculum. A supplied (32g) needle was used.   Injection: 6 mg faricimab-svoa 6 MG/0.05ML   Route: Intravitreal, Site: Right Eye   NDC: 57322-025-42, Lot: [id number], Expiration date: 03/26/2024, Waste: 0 mL   Post-op Post injection exam found visual acuity of at least counting fingers. The patient tolerated the procedure well. There were no complications. The patient received written and verbal post procedure care education. Post injection medications were not given.   Notes             ASSESSMENT/PLAN:   ICD-10-CM   1. Branch retinal vein occlusion of right eye with macular edema  H34.8310 OCT, Retina - OU - Both Eyes    Intravitreal Injection, Pharmacologic Agent - OD - Right Eye    faricimab-svoa (VABYSMO) 6mg /0.75mL intravitreal injection    2. Retinal artery branch occlusion of right eye  H34.231     3. Diabetes mellitus type 2 without retinopathy (HCC)  E11.9     4. Long term (current) use of oral hypoglycemic drugs  Z79.84     5. Current use of  insulin (HCC)  Z79.4     6. Essential hypertension  I10     7. Hypertensive retinopathy of both eyes  H35.033     8. Combined forms of age-related cataract of left eye  H25.812     9. 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 BRVO with CME OD  - lost to follow up to 16 wks instead of 9 (08.10.23-11.30.23)  - lost to follow up from 6 weeks to 8 weeks 912.28.23 - 02.22.24)  - s/p IVA OD #1 (10.05.20), #2 (11.02.20), #3 (11.30.20), #4 (01.07.21), #5 (02.05.21), #6 (03.05.21), #7 (04.02.21), #8 (05.04.21), #9 (06.03.21), #10 (07.22.21), #11 (08.19.21), #12 (09.16.21) -- IVA resistance  - s/p IVE OD #1 (10.14.21), #2 (11.11.21), #3 (12.09.21), #4 (01.13.22), #5 (2.10.22), #6 (04.05.22), #7 (05.26.22), #8 (07.21.22), #9 (09.29.22), #10 (12.01.22), #11 (02.02.23), #12 (04.06.23), #13 (06.08.23), #14 (08.10.23), #15 (11.30.23), #16 (12.28.23), #17 (02.22.24), #18 (04.02.24), #19 (05.16.24), #20 (06.27.24), #21 (08.13.24), #22 (09.19.24)  - s/p IVV OD #1 (10.29.24)  **history of increased peripapillary IRF at 5 wks, noted on 09.19.24 visit**  **history of increased IRF at 10 wk interval, noted on 9.29.22 visit and 16 wks on 11.30.23 visit**  - initial exam and OCT findings suggestive of a BRAO component contributing  - BCVA OD 20/20   - exam shows interval improvement in macular edema superior macula  - OCT shows OD: Stable improvement in central IRF and SRF, persistent pocket of IRF/IRHM SN macula -- slightly improved, partial PVD, focal schisis cavity IT periphery caught on widefield -- not imaged today at 5 weeks  - recommend IVV OD #2 today, 12.05.24 for BRVO w/ CME with f/u at 5 weeks again   - pt wishes to proceed  - RBA of procedure discussed, questions answered  - Vabysmo informed consent obtained and signed, 10.29.24  - see procedure note  - [email or web address]  benefits investigation started 9.16.21 -- approved for 2024 ([employer] Days Copay card)  - Elinor Dodge is approved for  2024  - F/U 5 weeks -- DFE/OCT/possible injection, widefield OCT through schisis  3-5. Diabetes mellitus, type 2 without retinopathy - The incidence, risk factors for progression, natural history and treatment options for diabetic retinopathy  were discussed with patient.   - The need for close monitoring of blood glucose, blood pressure, and serum lipids, avoiding cigarette or any type of tobacco, and the need for long term follow up was also discussed with patient. - f/u in 1 year, sooner prn  6,7. Hypertensive retinopathy OU  - pt not formally diagnosed with HTN  - discussed importance of tight BP control and its relation to problems #1-2 above  - advised discussion with PCP and possible calibration of home BP cuff with PCP's  - monitor  8. Mixed form age related cataract OS  - The symptoms of cataract, surgical options, and treatments and risks were discussed with patient.  - discussed diagnosis and progression  - under the expert management of Dr. Zenaida Niece  9. Pseudophakia OD  - s/p CE/IOL (Dr. Zenaida Niece, 10.18.23)  - IOL in good position  - post - op CME + BRVO w/ CME   - monitor  10. Retinoschisis OU  - shallow schisis inferotemporal periphery OU -- stable  - stable on widefield OCT  - no RT/RD on scleral depression  - pt asymptomatic  - monitor  Ophthalmic Meds Ordered this visit:  Meds ordered this encounter  Medications   faricimab-svoa (VABYSMO) 6mg /0.70mL intravitreal injection     Return in about 5 weeks (around 06/05/2023) for f/u BRVO OD, DFE, OCT.  There are no Patient Instructions on file for this visit.   Explained the diagnoses, plan, and follow up with the patient and they expressed understanding.  Patient expressed understanding of the importance of proper follow up care.   This document serves as a record of services personally performed by Karie Chimera, MD, PhD. It was created on their behalf by Annalee Genta, COMT. The creation of this record is the  provider's dictation and/or activities during the visit.  Electronically signed by: Annalee Genta, COMT 05/01/23 9:00 AM  This document serves as a record of services personally performed by Karie Chimera, MD, PhD. It was created on their behalf by Glee Arvin. Manson Passey, OA an ophthalmic technician. The creation of this record is the provider's dictation and/or activities during the visit.    Electronically signed by: Glee Arvin. Manson Passey, OA 05/01/23 9:00 AM  Karie Chimera, M.D., Ph.D. Diseases & Surgery of the Retina and Vitreous [facility]  I have reviewed the above documentation for accuracy and completeness, and I agree with the above. Karie Chimera, M.D., Ph.D. 05/01/23 9:01 AM   Abbreviations: M myopia (nearsighted); A astigmatism; H hyperopia (farsighted); P presbyopia; Mrx spectacle prescription;  CTL contact lenses; OD right eye; OS left eye; OU both eyes  XT exotropia; ET esotropia; PEK punctate epithelial keratitis; PEE punctate epithelial erosions; DES dry eye syndrome; MGD meibomian gland dysfunction; ATs artificial tears; PFAT's preservative free artificial tears; NSC nuclear sclerotic cataract; PSC posterior subcapsular cataract; ERM epi-retinal membrane; PVD posterior vitreous detachment; RD retinal detachment; DM diabetes mellitus; DR diabetic retinopathy; NPDR non-proliferative diabetic retinopathy; PDR proliferative diabetic retinopathy; CSME clinically significant macular edema; DME diabetic macular edema; dbh dot blot hemorrhages; CWS cotton wool spot; POAG primary open angle glaucoma; C/D cup-to-disc ratio; HVF  humphrey visual field; GVF goldmann visual field; OCT optical coherence tomography; IOP intraocular pressure; BRVO Branch retinal vein occlusion; CRVO central retinal vein occlusion; CRAO central retinal artery occlusion; BRAO branch retinal artery occlusion; RT retinal tear; SB scleral buckle; PPV pars plana vitrectomy; VH Vitreous hemorrhage; PRP  panretinal laser photocoagulation; IVK intravitreal kenalog; VMT vitreomacular traction; MH Macular hole;  NVD neovascularization of the disc; NVE neovascularization elsewhere; AREDS age related eye disease study; ARMD age related macular degeneration; POAG primary open angle glaucoma; EBMD epithelial/anterior basement membrane dystrophy; ACIOL anterior chamber intraocular lens; IOL intraocular lens; PCIOL posterior chamber intraocular lens; Phaco/IOL phacoemulsification with intraocular lens placement; PRK photorefractive keratectomy; LASIK laser assisted in situ keratomileusis; HTN hypertension; DM diabetes mellitus; COPD chronic obstructive pulmonary disease

## 2023-05-01 ENCOUNTER — Encounter (INDEPENDENT_AMBULATORY_CARE_PROVIDER_SITE_OTHER): Payer: Self-pay | Admitting: Ophthalmology

## 2023-05-01 ENCOUNTER — Ambulatory Visit (INDEPENDENT_AMBULATORY_CARE_PROVIDER_SITE_OTHER): Payer: 59 | Admitting: Ophthalmology

## 2023-05-01 DIAGNOSIS — I10 Essential (primary) hypertension: Secondary | ICD-10-CM

## 2023-05-01 DIAGNOSIS — H34231 Retinal artery branch occlusion, right eye: Secondary | ICD-10-CM

## 2023-05-01 DIAGNOSIS — Z961 Presence of intraocular lens: Secondary | ICD-10-CM

## 2023-05-01 DIAGNOSIS — H35033 Hypertensive retinopathy, bilateral: Secondary | ICD-10-CM

## 2023-05-01 DIAGNOSIS — Z7984 Long term (current) use of oral hypoglycemic drugs: Secondary | ICD-10-CM | POA: Diagnosis not present

## 2023-05-01 DIAGNOSIS — E119 Type 2 diabetes mellitus without complications: Secondary | ICD-10-CM

## 2023-05-01 DIAGNOSIS — Z794 Long term (current) use of insulin: Secondary | ICD-10-CM

## 2023-05-01 DIAGNOSIS — H33103 Unspecified retinoschisis, bilateral: Secondary | ICD-10-CM

## 2023-05-01 DIAGNOSIS — H34831 Tributary (branch) retinal vein occlusion, right eye, with macular edema: Secondary | ICD-10-CM

## 2023-05-01 DIAGNOSIS — H25812 Combined forms of age-related cataract, left eye: Secondary | ICD-10-CM

## 2023-05-01 MED ORDER — FARICIMAB-SVOA 6 MG/0.05ML IZ SOSY
6.0000 mg | PREFILLED_SYRINGE | INTRAVITREAL | Status: AC | PRN
Start: 2023-05-01 — End: 2023-05-01
  Administered 2023-05-01: 6 mg via INTRAVITREAL

## 2023-06-05 ENCOUNTER — Encounter (INDEPENDENT_AMBULATORY_CARE_PROVIDER_SITE_OTHER): Payer: 59 | Admitting: Ophthalmology

## 2023-06-05 DIAGNOSIS — H33103 Unspecified retinoschisis, bilateral: Secondary | ICD-10-CM

## 2023-06-05 DIAGNOSIS — H34231 Retinal artery branch occlusion, right eye: Secondary | ICD-10-CM

## 2023-06-05 DIAGNOSIS — E119 Type 2 diabetes mellitus without complications: Secondary | ICD-10-CM

## 2023-06-05 DIAGNOSIS — H25812 Combined forms of age-related cataract, left eye: Secondary | ICD-10-CM

## 2023-06-05 DIAGNOSIS — H34831 Tributary (branch) retinal vein occlusion, right eye, with macular edema: Secondary | ICD-10-CM

## 2023-06-05 DIAGNOSIS — Z961 Presence of intraocular lens: Secondary | ICD-10-CM

## 2023-06-05 DIAGNOSIS — I10 Essential (primary) hypertension: Secondary | ICD-10-CM

## 2023-06-05 DIAGNOSIS — H35033 Hypertensive retinopathy, bilateral: Secondary | ICD-10-CM

## 2023-06-05 DIAGNOSIS — Z794 Long term (current) use of insulin: Secondary | ICD-10-CM

## 2023-06-05 DIAGNOSIS — Z7984 Long term (current) use of oral hypoglycemic drugs: Secondary | ICD-10-CM

## 2023-06-17 NOTE — Progress Notes (Shared)
[facility] - Clinic Note  06/19/2023     CHIEF COMPLAINT Patient presents for Retina Follow Up   HISTORY OF PRESENT ILLNESS: Jacqueline Greer is a 63 y.o. female who presents to the clinic today for:   HPI     Retina Follow Up   Patient presents with  CRVO/BRVO.  In right eye.  This started 5 weeks ago.  Duration of 5 weeks.  Since onset it is stable.  I, the attending physician,  performed the HPI with the patient and updated documentation appropriately.        Comments   5 week retina follow up BRVO OD and IVV OD pt is reporting no vision changes noticed she denies any flashes or floaters last reading 123      Last edited by Rennis Chris, MD on 06/19/2023  2:53 PM.    Pt is delayed to 7 weeks from 5   Referring physician: Luciano Cutter, PA-C [address] [place] ,  [place] [address]  HISTORICAL INFORMATION:   Selected notes from the MEDICAL RECORD NUMBER Referred by Dr. Krista Blue for concern of BRVO   CURRENT MEDICATIONS: No current outpatient medications on file. (Ophthalmic Drugs)   No current facility-administered medications for this visit. (Ophthalmic Drugs)   Current Outpatient Medications (Other)  Medication Sig   insulin degludec (TRESIBA FLEXTOUCH) 100 UNIT/ML FlexTouch Pen Inject 6 Units into the skin daily.   metformin (FORTAMET) 500 MG (OSM) 24 hr tablet Take 500 tablets by mouth 1 day or 1 dose. One table before dinner.   omeprazole (PRILOSEC) 20 MG capsule Take 20 mg by mouth daily.   No current facility-administered medications for this visit. (Other)   REVIEW OF SYSTEMS: ROS   Positive for: Endocrine, Eyes Negative for: Constitutional, Gastrointestinal, Neurological, Skin, Genitourinary, Musculoskeletal, HENT, Cardiovascular, Respiratory, Psychiatric, Allergic/Imm, Heme/Lymph Last edited by Etheleen Mayhew, COT on 06/19/2023  8:00 AM.       ALLERGIES No Known Allergies  PAST MEDICAL HISTORY Past  Medical History:  Diagnosis Date   Cataract    OU   Hypertensive retinopathy    OU   Past Surgical History:  Procedure Laterality Date   LAPAROSCOPIC TUBAL LIGATION  2003   FAMILY HISTORY Family History  Problem Relation Age of Onset   Diabetes Mother    Kidney failure Father    SOCIAL HISTORY Social History   Tobacco Use   Smoking status: Never   Smokeless tobacco: Never  Vaping Use   Vaping status: Never Used  Substance Use Topics   Alcohol use: No   Drug use: No       OPHTHALMIC EXAM:  Base Eye Exam     Visual Acuity (Snellen - Linear)       Right Left   Dist cc 20/20 20/20    Correction: Glasses         Tonometry (Tonopen, 8:02 AM)       Right Left   Pressure 12 12         Pupils       Pupils Dark Light Shape React APD   Right PERRL 4 3 Round Brisk None   Left PERRL 4 3 Round Brisk None         Visual Fields       Left Right    Full Full         Extraocular Movement       Right Left    Full, Ortho Full, Ortho  Neuro/Psych     Oriented x3: Yes   Mood/Affect: Normal         Dilation     Right eye: 2.5% Phenylephrine @ 8:02 AM           Slit Lamp and Fundus Exam     Slit Lamp Exam       Right Left   Lids/Lashes Dermatochalasis - upper lid Normal   Conjunctiva/Sclera Nasal and temporal Pinguecula, Melanosis Nasal and Pinguecula, Melanosis   Cornea well healed cataract wound, tear film debris, 2+ Punctate epithelial erosions trace Punctate epithelial erosions, trace tear film debris   Anterior Chamber deep and clear, no cell or flare Deep and clear   Iris Round and dilated, No NVI Round and reactive   Lens PC IOL in good position, trace Posterior capsular opacification 2+ Nuclear sclerosis, 2-3+ Cortical cataract   Anterior Vitreous mild Vitreous syneresis, Posterior vitreous detachment mild Vitreous syneresis         Fundus Exam       Right Left   Disc 2+Pallor, +PPP    C/D Ratio 0.5 0.5    Macula good foveal reflex, stable improvement in central edema, focal edema with punctate exudates SN macula, focal punctate exudates temporal macula    Vessels attenuated, Tortuous, severe attenuation ST arcades    Periphery Attached, DBH temporal periphery -- extension of BRVO, bullous schisis cavity IT periphery (from 0700-0900) - stable; focal pigmented CR scar at 0130 midzone, no RT/RD, WWOP temporally, pigmented cystoid degeneration inferiorly            Refraction     Wearing Rx       Sphere Cylinder Axis Add   Right -0.50 +1.00 152 +2.25   Left -4.50 +0.75 019 +2.25           IMAGING AND PROCEDURES  Imaging and Procedures for @TODAY @  OCT, Retina - OU - Both Eyes       Right Eye Quality was good. Central Foveal Thickness: 237. Progression has been stable. Findings include normal foveal contour, no SRF, intraretinal hyper-reflective material, intraretinal fluid (Stable improvement in central IRF and SRF, persistent pocket of IRF/IRHM SN macula, partial PVD, focal schisis cavity IT periphery caught on widefield -- not imaged today).   Left Eye Quality was good. Central Foveal Thickness: 242. Progression has been stable. Findings include normal foveal contour, no IRF, no SRF (Partial PVD, Shallow IT Retinoschisis -- not imaged today).   Notes *Images captured and stored on drive  Diagnosis / Impression:  OD: Stable improvement in central IRF and SRF, persistent pocket of IRF/IRHM SN macula, partial PVD, focal schisis cavity IT periphery caught on widefield -- not imaged today OS: NFP, no IRF/SRF; Partial PVD, shallow IT retinoschisis -- not imaged today  Clinical management:  See below  Abbreviations: NFP - Normal foveal profile. CME - cystoid macular edema. PED - pigment epithelial detachment. IRF - intraretinal fluid. SRF - subretinal fluid. EZ - ellipsoid zone. ERM - epiretinal membrane. ORA - outer retinal atrophy. ORT - outer retinal tubulation. SRHM - subretinal  hyper-reflective material      Intravitreal Injection, Pharmacologic Agent - OD - Right Eye       Time Out 06/19/2023. 8:53 AM. Confirmed correct patient, procedure, site, and patient consented.   Anesthesia Topical anesthesia was used. Anesthetic medications included Lidocaine 2%, Proparacaine 0.5%.   Procedure Preparation included 5% betadine to ocular surface, eyelid speculum. A supplied (32g) needle was used.   Injection: 6 mg  faricimab-svoa 6 MG/0.05ML   Route: Intravitreal, Site: Right Eye   NDC: [id number], Lot: [id number], Expiration date: 09/23/2024, Waste: 0 mL   Post-op Post injection exam found visual acuity of at least counting fingers. The patient tolerated the procedure well. There were no complications. The patient received written and verbal post procedure care education. Post injection medications were not given.   Notes             ASSESSMENT/PLAN:   ICD-10-CM   1. Branch retinal vein occlusion of right eye with macular edema  H34.8310 OCT, Retina - OU - Both Eyes    Intravitreal Injection, Pharmacologic Agent - OD - Right Eye    faricimab-svoa (VABYSMO) 6mg /0.20mL intravitreal injection    2. Retinal artery branch occlusion of right eye  H34.231     3. Diabetes mellitus type 2 without retinopathy (HCC)  E11.9     4. Long term (current) use of oral hypoglycemic drugs  Z79.84     5. Current use of insulin (HCC)  Z79.4     6. Essential hypertension  I10     7. Hypertensive retinopathy of both eyes  H35.033     8. Combined forms of age-related cataract of left eye  H25.812     9. 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 BRVO with CME OD  - delayed f/u -- 7 wks instead of 5 on 01.23.25  - lost to follow up to 16 wks instead of 9 (08.10.23-11.30.23)  - lost to follow up from 6 weeks to 8 weeks 912.28.23 - 02.22.24)  - s/p IVA OD #1 (10.05.20), #2 (11.02.20), #3 (11.30.20), #4 (01.07.21), #5 (02.05.21), #6  (03.05.21), #7 (04.02.21), #8 (05.04.21), #9 (06.03.21), #10 (07.22.21), #11 (08.19.21), #12 (09.16.21) -- IVA resistance  - s/p IVE OD #1 (10.14.21), #2 (11.11.21), #3 (12.09.21), #4 (01.13.22), #5 (2.10.22), #6 (04.05.22), #7 (05.26.22), #8 (07.21.22), #9 (09.29.22), #10 (12.01.22), #11 (02.02.23), #12 (04.06.23), #13 (06.08.23), #14 (08.10.23), #15 (11.30.23), #16 (12.28.23), #17 (02.22.24), #18 (04.02.24), #19 (05.16.24), #20 (06.27.24), #21 (08.13.24), #22 (09.19.24)  - s/p IVV OD #1 (10.29.24), #2 (12.05.24)  **history of increased peripapillary IRF at 5 wks, noted on 09.19.24 visit**  **history of increased IRF at 10 wk interval, noted on 9.29.22 visit and 16 wks on 11.30.23 visit**  - initial exam and OCT findings suggestive of a BRAO component contributing  - BCVA OD 20/20   - exam shows interval improvement in macular edema superior macula  - OCT shows OD: Stable improvement in central IRF and SRF, persistent pocket of IRF/IRHM SN macula, partial PVD, focal schisis cavity IT periphery caught on widefield -- not imaged today at 7 weeks  - recommend IVV OD #3 today, 01.23.25 for BRVO w/ CME with f/u back to 5 weeks  - pt wishes to proceed  - RBA of procedure discussed, questions answered  - Vabysmo informed consent obtained and signed, 10.29.24  - see procedure note  - [employer]yle[employer] benefits investigation started 9.16.21 -- approved for 2025  - Vabysmo is approved for 2025  - F/U 5 weeks -- DFE/OCT/possible injection, widefield OCT through schisis  3-5. Diabetes mellitus, type 2 without retinopathy - The incidence, risk factors for progression, natural history and treatment options for diabetic retinopathy  were discussed with patient.   - The need for close monitoring of blood glucose, blood pressure, and serum lipids, avoiding cigarette or any type of tobacco, and the need for  long term follow up was also discussed with patient. - f/u in 1 year, sooner prn  6,7. Hypertensive retinopathy  OU  - pt not formally diagnosed with HTN  - discussed importance of tight BP control and its relation to problems #1-2 above  - advised discussion with PCP and possible calibration of home BP cuff with PCP's  - monitor  8. Mixed form age related cataract OS  - The symptoms of cataract, surgical options, and treatments and risks were discussed with patient.  - discussed diagnosis and progression  - under the expert management of Dr. Zenaida Niece  9. Pseudophakia OD  - s/p CE/IOL (Dr. Zenaida Niece, 10.18.23)  - IOL in good position  - post - op CME + BRVO w/ CME   - monitor  10. Retinoschisis OU  - shallow schisis inferotemporal periphery OU -- stable  - stable on widefield OCT  - no RT/RD on scleral depression  - pt asymptomatic  - monitor  Ophthalmic Meds Ordered this visit:  Meds ordered this encounter  Medications   faricimab-svoa (VABYSMO) 6mg /0.77mL intravitreal injection     Return in about 5 weeks (around 07/24/2023) for f/u BRVO OD, DFE, OCT, Possible Injxn.  There are no Patient Instructions on file for this visit.   Explained the diagnoses, plan, and follow up with the patient and they expressed understanding.  Patient expressed understanding of the importance of proper follow up care.   This document serves as a record of services personally performed by Karie Chimera, MD, PhD. It was created on their behalf by Annalee Genta, COMT. The creation of this record is the provider's dictation and/or activities during the visit.  Electronically signed by: Annalee Genta, COMT 06/19/23 5:40 PM  This document serves as a record of services personally performed by Karie Chimera, MD, PhD. It was created on their behalf by Glee Arvin. Manson Passey, OA an ophthalmic technician. The creation of this record is the provider's dictation and/or activities during the visit.    Electronically signed by: Glee Arvin. Manson Passey, OA 06/19/23 5:40 PM  Karie Chimera, M.D., Ph.D. Diseases & Surgery of the Retina and  Vitreous [facility]  I have reviewed the above documentation for accuracy and completeness, and I agree with the above. Karie Chimera, M.D., Ph.D. 06/19/23 5:40 PM   Abbreviations: M myopia (nearsighted); A astigmatism; H hyperopia (farsighted); P presbyopia; Mrx spectacle prescription;  CTL contact lenses; OD right eye; OS left eye; OU both eyes  XT exotropia; ET esotropia; PEK punctate epithelial keratitis; PEE punctate epithelial erosions; DES dry eye syndrome; MGD meibomian gland dysfunction; ATs artificial tears; PFAT's preservative free artificial tears; NSC nuclear sclerotic cataract; PSC posterior subcapsular cataract; ERM epi-retinal membrane; PVD posterior vitreous detachment; RD retinal detachment; DM diabetes mellitus; DR diabetic retinopathy; NPDR non-proliferative diabetic retinopathy; PDR proliferative diabetic retinopathy; CSME clinically significant macular edema; DME diabetic macular edema; dbh dot blot hemorrhages; CWS cotton wool spot; POAG primary open angle glaucoma; C/D cup-to-disc ratio; HVF humphrey visual field; GVF goldmann visual field; OCT optical coherence tomography; IOP intraocular pressure; BRVO Branch retinal vein occlusion; CRVO central retinal vein occlusion; CRAO central retinal artery occlusion; BRAO branch retinal artery occlusion; RT retinal tear; SB scleral buckle; PPV pars plana vitrectomy; VH Vitreous hemorrhage; PRP panretinal laser photocoagulation; IVK intravitreal kenalog; VMT vitreomacular traction; MH Macular hole;  NVD neovascularization of the disc; NVE neovascularization elsewhere; AREDS age related eye disease study; ARMD age related macular degeneration; POAG primary open angle  glaucoma; EBMD epithelial/anterior basement membrane dystrophy; ACIOL anterior chamber intraocular lens; IOL intraocular lens; PCIOL posterior chamber intraocular lens; Phaco/IOL phacoemulsification with intraocular lens placement; PRK photorefractive  keratectomy; LASIK laser assisted in situ keratomileusis; HTN hypertension; DM diabetes mellitus; COPD chronic obstructive pulmonary disease

## 2023-06-19 ENCOUNTER — Ambulatory Visit (INDEPENDENT_AMBULATORY_CARE_PROVIDER_SITE_OTHER): Payer: 59 | Admitting: Ophthalmology

## 2023-06-19 ENCOUNTER — Encounter (INDEPENDENT_AMBULATORY_CARE_PROVIDER_SITE_OTHER): Payer: Self-pay | Admitting: Ophthalmology

## 2023-06-19 DIAGNOSIS — H34831 Tributary (branch) retinal vein occlusion, right eye, with macular edema: Secondary | ICD-10-CM | POA: Diagnosis not present

## 2023-06-19 DIAGNOSIS — E119 Type 2 diabetes mellitus without complications: Secondary | ICD-10-CM

## 2023-06-19 DIAGNOSIS — H34231 Retinal artery branch occlusion, right eye: Secondary | ICD-10-CM | POA: Diagnosis not present

## 2023-06-19 DIAGNOSIS — H33103 Unspecified retinoschisis, bilateral: Secondary | ICD-10-CM

## 2023-06-19 DIAGNOSIS — Z961 Presence of intraocular lens: Secondary | ICD-10-CM

## 2023-06-19 DIAGNOSIS — Z7984 Long term (current) use of oral hypoglycemic drugs: Secondary | ICD-10-CM

## 2023-06-19 DIAGNOSIS — H25812 Combined forms of age-related cataract, left eye: Secondary | ICD-10-CM

## 2023-06-19 DIAGNOSIS — H35033 Hypertensive retinopathy, bilateral: Secondary | ICD-10-CM

## 2023-06-19 DIAGNOSIS — I10 Essential (primary) hypertension: Secondary | ICD-10-CM

## 2023-06-19 DIAGNOSIS — Z794 Long term (current) use of insulin: Secondary | ICD-10-CM

## 2023-06-19 MED ORDER — FARICIMAB-SVOA 6 MG/0.05ML IZ SOSY
6.0000 mg | PREFILLED_SYRINGE | INTRAVITREAL | Status: AC | PRN
  Administered 2023-06-19: 6 mg via INTRAVITREAL

## 2023-06-19 NOTE — Progress Notes (Signed)
[facility] - Clinic Note  06/19/2023     CHIEF COMPLAINT Patient presents for Retina Follow Up   HISTORY OF PRESENT ILLNESS: Jacqueline Greer is a 63 y.o. female who presents to the clinic today for:   HPI     Retina Follow Up   Patient presents with  CRVO/BRVO.  In right eye.  This started 5 weeks ago.  Duration of 5 weeks.  Since onset it is stable.  I, the attending physician,  performed the HPI with the patient and updated documentation appropriately.        Comments   5 week retina follow up BRVO OD and IVV OD pt is reporting no vision changes noticed she denies any flashes or floaters last reading 123      Last edited by Rennis Chris, MD on 06/19/2023  2:53 PM.    Pt is delayed to 7 weeks from 5   Referring physician: Luciano Cutter, PA-C [address] [place] ,  [place] [address]  HISTORICAL INFORMATION:   Selected notes from the MEDICAL RECORD NUMBER Referred by Dr. Krista Blue for concern of BRVO   CURRENT MEDICATIONS: No current outpatient medications on file. (Ophthalmic Drugs)   No current facility-administered medications for this visit. (Ophthalmic Drugs)   Current Outpatient Medications (Other)  Medication Sig   insulin degludec (TRESIBA FLEXTOUCH) 100 UNIT/ML FlexTouch Pen Inject 6 Units into the skin daily.   metformin (FORTAMET) 500 MG (OSM) 24 hr tablet Take 500 tablets by mouth 1 day or 1 dose. One table before dinner.   omeprazole (PRILOSEC) 20 MG capsule Take 20 mg by mouth daily.   No current facility-administered medications for this visit. (Other)   REVIEW OF SYSTEMS: ROS   Positive for: Endocrine, Eyes Negative for: Constitutional, Gastrointestinal, Neurological, Skin, Genitourinary, Musculoskeletal, HENT, Cardiovascular, Respiratory, Psychiatric, Allergic/Imm, Heme/Lymph Last edited by Etheleen Mayhew, COT on 06/19/2023  8:00 AM.       ALLERGIES No Known Allergies  PAST MEDICAL HISTORY Past  Medical History:  Diagnosis Date   Cataract    OU   Hypertensive retinopathy    OU   Past Surgical History:  Procedure Laterality Date   LAPAROSCOPIC TUBAL LIGATION  2003   FAMILY HISTORY Family History  Problem Relation Age of Onset   Diabetes Mother    Kidney failure Father    SOCIAL HISTORY Social History   Tobacco Use   Smoking status: Never   Smokeless tobacco: Never  Vaping Use   Vaping status: Never Used  Substance Use Topics   Alcohol use: No   Drug use: No       OPHTHALMIC EXAM:  Base Eye Exam     Visual Acuity (Snellen - Linear)       Right Left   Dist cc 20/20 20/20    Correction: Glasses         Tonometry (Tonopen, 8:02 AM)       Right Left   Pressure 12 12         Pupils       Pupils Dark Light Shape React APD   Right PERRL 4 3 Round Brisk None   Left PERRL 4 3 Round Brisk None         Visual Fields       Left Right    Full Full         Extraocular Movement       Right Left    Full, Ortho Full, Ortho  Neuro/Psych     Oriented x3: Yes   Mood/Affect: Normal         Dilation     Right eye: 2.5% Phenylephrine @ 8:02 AM           Slit Lamp and Fundus Exam     Slit Lamp Exam       Right Left   Lids/Lashes Dermatochalasis - upper lid Normal   Conjunctiva/Sclera Nasal and temporal Pinguecula, Melanosis Nasal and Pinguecula, Melanosis   Cornea well healed cataract wound, tear film debris, 2+ Punctate epithelial erosions trace Punctate epithelial erosions, trace tear film debris   Anterior Chamber deep and clear, no cell or flare Deep and clear   Iris Round and dilated, No NVI Round and reactive   Lens PC IOL in good position, trace Posterior capsular opacification 2+ Nuclear sclerosis, 2-3+ Cortical cataract   Anterior Vitreous mild Vitreous syneresis, Posterior vitreous detachment mild Vitreous syneresis         Fundus Exam       Right Left   Disc 2+Pallor, +PPP    C/D Ratio 0.5 0.5    Macula good foveal reflex, stable improvement in central edema, focal edema with punctate exudates SN macula, focal punctate exudates temporal macula    Vessels attenuated, Tortuous, severe attenuation ST arcades    Periphery Attached, DBH temporal periphery -- extension of BRVO, bullous schisis cavity IT periphery (from 0700-0900) - stable; focal pigmented CR scar at 0130 midzone, no RT/RD, WWOP temporally, pigmented cystoid degeneration inferiorly            Refraction     Wearing Rx       Sphere Cylinder Axis Add   Right -0.50 +1.00 152 +2.25   Left -4.50 +0.75 019 +2.25           IMAGING AND PROCEDURES  Imaging and Procedures for @TODAY @  OCT, Retina - OU - Both Eyes       Right Eye Quality was good. Central Foveal Thickness: 237. Progression has been stable. Findings include normal foveal contour, no SRF, intraretinal hyper-reflective material, intraretinal fluid (Stable improvement in central IRF and SRF, persistent pocket of IRF/IRHM SN macula, partial PVD, focal schisis cavity IT periphery caught on widefield -- not imaged today).   Left Eye Quality was good. Central Foveal Thickness: 242. Progression has been stable. Findings include normal foveal contour, no IRF, no SRF (Partial PVD, Shallow IT Retinoschisis -- not imaged today).   Notes *Images captured and stored on drive  Diagnosis / Impression:  OD: Stable improvement in central IRF and SRF, persistent pocket of IRF/IRHM SN macula, partial PVD, focal schisis cavity IT periphery caught on widefield -- not imaged today OS: NFP, no IRF/SRF; Partial PVD, shallow IT retinoschisis -- not imaged today  Clinical management:  See below  Abbreviations: NFP - Normal foveal profile. CME - cystoid macular edema. PED - pigment epithelial detachment. IRF - intraretinal fluid. SRF - subretinal fluid. EZ - ellipsoid zone. ERM - epiretinal membrane. ORA - outer retinal atrophy. ORT - outer retinal tubulation. SRHM - subretinal  hyper-reflective material      Intravitreal Injection, Pharmacologic Agent - OD - Right Eye       Time Out 06/19/2023. 8:53 AM. Confirmed correct patient, procedure, site, and patient consented.   Anesthesia Topical anesthesia was used. Anesthetic medications included Lidocaine 2%, Proparacaine 0.5%.   Procedure Preparation included 5% betadine to ocular surface, eyelid speculum. A supplied (32g) needle was used.   Injection: 6 mg  faricimab-svoa 6 MG/0.05ML   Route: Intravitreal, Site: Right Eye   NDC: [id number], Lot: [id number], Expiration date: 09/23/2024, Waste: 0 mL   Post-op Post injection exam found visual acuity of at least counting fingers. The patient tolerated the procedure well. There were no complications. The patient received written and verbal post procedure care education. Post injection medications were not given.   Notes             ASSESSMENT/PLAN:   ICD-10-CM   1. Branch retinal vein occlusion of right eye with macular edema  H34.8310 OCT, Retina - OU - Both Eyes    Intravitreal Injection, Pharmacologic Agent - OD - Right Eye    faricimab-svoa (VABYSMO) 6mg /0.20mL intravitreal injection    2. Retinal artery branch occlusion of right eye  H34.231     3. Diabetes mellitus type 2 without retinopathy (HCC)  E11.9     4. Long term (current) use of oral hypoglycemic drugs  Z79.84     5. Current use of insulin (HCC)  Z79.4     6. Essential hypertension  I10     7. Hypertensive retinopathy of both eyes  H35.033     8. Combined forms of age-related cataract of left eye  H25.812     9. 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 BRVO with CME OD  - delayed f/u -- 7 wks instead of 5 on 01.23.25  - lost to follow up to 16 wks instead of 9 (08.10.23-11.30.23)  - lost to follow up from 6 weeks to 8 weeks 912.28.23 - 02.22.24)  - s/p IVA OD #1 (10.05.20), #2 (11.02.20), #3 (11.30.20), #4 (01.07.21), #5 (02.05.21), #6  (03.05.21), #7 (04.02.21), #8 (05.04.21), #9 (06.03.21), #10 (07.22.21), #11 (08.19.21), #12 (09.16.21) -- IVA resistance  - s/p IVE OD #1 (10.14.21), #2 (11.11.21), #3 (12.09.21), #4 (01.13.22), #5 (2.10.22), #6 (04.05.22), #7 (05.26.22), #8 (07.21.22), #9 (09.29.22), #10 (12.01.22), #11 (02.02.23), #12 (04.06.23), #13 (06.08.23), #14 (08.10.23), #15 (11.30.23), #16 (12.28.23), #17 (02.22.24), #18 (04.02.24), #19 (05.16.24), #20 (06.27.24), #21 (08.13.24), #22 (09.19.24)  - s/p IVV OD #1 (10.29.24), #2 (12.05.24)  **history of increased peripapillary IRF at 5 wks, noted on 09.19.24 visit**  **history of increased IRF at 10 wk interval, noted on 9.29.22 visit and 16 wks on 11.30.23 visit**  - initial exam and OCT findings suggestive of a BRAO component contributing  - BCVA OD 20/20   - exam shows interval improvement in macular edema superior macula  - OCT shows OD: Stable improvement in central IRF and SRF, persistent pocket of IRF/IRHM SN macula, partial PVD, focal schisis cavity IT periphery caught on widefield -- not imaged today at 7 weeks  - recommend IVV OD #3 today, 01.23.25 for BRVO w/ CME with f/u back to 5 weeks  - pt wishes to proceed  - RBA of procedure discussed, questions answered  - Vabysmo informed consent obtained and signed, 10.29.24  - see procedure note  - [employer]yle[employer] benefits investigation started 9.16.21 -- approved for 2025  - Vabysmo is approved for 2025  - F/U 5 weeks -- DFE/OCT/possible injection, widefield OCT through schisis  3-5. Diabetes mellitus, type 2 without retinopathy - The incidence, risk factors for progression, natural history and treatment options for diabetic retinopathy  were discussed with patient.   - The need for close monitoring of blood glucose, blood pressure, and serum lipids, avoiding cigarette or any type of tobacco, and the need for  long term follow up was also discussed with patient. - f/u in 1 year, sooner prn  6,7. Hypertensive retinopathy  OU  - pt not formally diagnosed with HTN  - discussed importance of tight BP control and its relation to problems #1-2 above  - advised discussion with PCP and possible calibration of home BP cuff with PCP's  - monitor  8. Mixed form age related cataract OS  - The symptoms of cataract, surgical options, and treatments and risks were discussed with patient.  - discussed diagnosis and progression  - under the expert management of Dr. Zenaida Niece  9. Pseudophakia OD  - s/p CE/IOL (Dr. Zenaida Niece, 10.18.23)  - IOL in good position  - post - op CME + BRVO w/ CME   - monitor  10. Retinoschisis OU  - shallow schisis inferotemporal periphery OU -- stable  - stable on widefield OCT  - no RT/RD on scleral depression  - pt asymptomatic  - monitor  Ophthalmic Meds Ordered this visit:  Meds ordered this encounter  Medications   faricimab-svoa (VABYSMO) 6mg /0.77mL intravitreal injection     Return in about 5 weeks (around 07/24/2023) for f/u BRVO OD, DFE, OCT, Possible Injxn.  There are no Patient Instructions on file for this visit.   Explained the diagnoses, plan, and follow up with the patient and they expressed understanding.  Patient expressed understanding of the importance of proper follow up care.   This document serves as a record of services personally performed by Karie Chimera, MD, PhD. It was created on their behalf by Annalee Genta, COMT. The creation of this record is the provider's dictation and/or activities during the visit.  Electronically signed by: Annalee Genta, COMT 06/19/23 5:40 PM  This document serves as a record of services personally performed by Karie Chimera, MD, PhD. It was created on their behalf by Glee Arvin. Manson Passey, OA an ophthalmic technician. The creation of this record is the provider's dictation and/or activities during the visit.    Electronically signed by: Glee Arvin. Manson Passey, OA 06/19/23 5:40 PM  Karie Chimera, M.D., Ph.D. Diseases & Surgery of the Retina and  Vitreous [facility]  I have reviewed the above documentation for accuracy and completeness, and I agree with the above. Karie Chimera, M.D., Ph.D. 06/19/23 5:40 PM   Abbreviations: M myopia (nearsighted); A astigmatism; H hyperopia (farsighted); P presbyopia; Mrx spectacle prescription;  CTL contact lenses; OD right eye; OS left eye; OU both eyes  XT exotropia; ET esotropia; PEK punctate epithelial keratitis; PEE punctate epithelial erosions; DES dry eye syndrome; MGD meibomian gland dysfunction; ATs artificial tears; PFAT's preservative free artificial tears; NSC nuclear sclerotic cataract; PSC posterior subcapsular cataract; ERM epi-retinal membrane; PVD posterior vitreous detachment; RD retinal detachment; DM diabetes mellitus; DR diabetic retinopathy; NPDR non-proliferative diabetic retinopathy; PDR proliferative diabetic retinopathy; CSME clinically significant macular edema; DME diabetic macular edema; dbh dot blot hemorrhages; CWS cotton wool spot; POAG primary open angle glaucoma; C/D cup-to-disc ratio; HVF humphrey visual field; GVF goldmann visual field; OCT optical coherence tomography; IOP intraocular pressure; BRVO Branch retinal vein occlusion; CRVO central retinal vein occlusion; CRAO central retinal artery occlusion; BRAO branch retinal artery occlusion; RT retinal tear; SB scleral buckle; PPV pars plana vitrectomy; VH Vitreous hemorrhage; PRP panretinal laser photocoagulation; IVK intravitreal kenalog; VMT vitreomacular traction; MH Macular hole;  NVD neovascularization of the disc; NVE neovascularization elsewhere; AREDS age related eye disease study; ARMD age related macular degeneration; POAG primary open angle  glaucoma; EBMD epithelial/anterior basement membrane dystrophy; ACIOL anterior chamber intraocular lens; IOL intraocular lens; PCIOL posterior chamber intraocular lens; Phaco/IOL phacoemulsification with intraocular lens placement; PRK photorefractive  keratectomy; LASIK laser assisted in situ keratomileusis; HTN hypertension; DM diabetes mellitus; COPD chronic obstructive pulmonary disease

## 2023-07-22 NOTE — Progress Notes (Signed)
 [facility] - Clinic Note  07/24/2023     CHIEF COMPLAINT Patient presents for Retina Follow Up   HISTORY OF PRESENT ILLNESS: Jacqueline Greer is a 63 y.o. female who presents to the clinic today for:   HPI     Retina Follow Up   Patient presents with  CRVO/BRVO.  In right eye.  This started 5 weeks ago.  Duration of 5 weeks.  Since onset it is stable.  I, the attending physician,  performed the HPI with the patient and updated documentation appropriately.        Comments   Patient states that last week the right eye fell swollen and was very watery. She is not using eye drops. Her blood sugar was 127.      Last edited by Rennis Chris, MD on 07/24/2023 12:48 PM.    Pt states last week, her right eye felt like it had pressure on it and then it started watering  Referring physician: Luciano Cutter, PA-C [address] [place] ,  [place] [address]  HISTORICAL INFORMATION:   Selected notes from the MEDICAL RECORD NUMBER Referred by Dr. Krista Blue for concern of BRVO   CURRENT MEDICATIONS: No current outpatient medications on file. (Ophthalmic Drugs)   No current facility-administered medications for this visit. (Ophthalmic Drugs)   Current Outpatient Medications (Other)  Medication Sig   insulin degludec (TRESIBA FLEXTOUCH) 100 UNIT/ML FlexTouch Pen Inject 6 Units into the skin daily.   metformin (FORTAMET) 500 MG (OSM) 24 hr tablet Take 500 tablets by mouth 1 day or 1 dose. One table before dinner.   omeprazole (PRILOSEC) 20 MG capsule Take 20 mg by mouth daily.   No current facility-administered medications for this visit. (Other)   REVIEW OF SYSTEMS: ROS   Positive for: Endocrine, Eyes Negative for: Constitutional, Gastrointestinal, Neurological, Skin, Genitourinary, Musculoskeletal, HENT, Cardiovascular, Respiratory, Psychiatric, Allergic/Imm, Heme/Lymph Last edited by Charlette Caffey, COT on 07/24/2023  8:06 AM.         ALLERGIES No Known Allergies  PAST MEDICAL HISTORY Past Medical History:  Diagnosis Date   Cataract    OU   Hypertensive retinopathy    OU   Past Surgical History:  Procedure Laterality Date   LAPAROSCOPIC TUBAL LIGATION  2003   FAMILY HISTORY Family History  Problem Relation Age of Onset   Diabetes Mother    Kidney failure Father    SOCIAL HISTORY Social History   Tobacco Use   Smoking status: Never   Smokeless tobacco: Never  Vaping Use   Vaping status: Never Used  Substance Use Topics   Alcohol use: No   Drug use: No       OPHTHALMIC EXAM:  Base Eye Exam     Visual Acuity (Snellen - Linear)       Right Left   Dist cc 20/20 20/20    Correction: Glasses         Tonometry (Tonopen, 8:09 AM)       Right Left   Pressure 16 18         Pupils       Dark Light Shape React APD   Right 4 3 Round Brisk None   Left 4 3 Round Brisk None         Visual Fields       Left Right    Full Full         Extraocular Movement       Right Left  Full, Ortho Full, Ortho         Neuro/Psych     Oriented x3: Yes   Mood/Affect: Normal         Dilation     Right eye: 1.0% Mydriacyl, 2.5% Phenylephrine @ 8:07 AM           Slit Lamp and Fundus Exam     Slit Lamp Exam       Right Left   Lids/Lashes Dermatochalasis - upper lid Normal   Conjunctiva/Sclera Nasal and temporal Pinguecula, Melanosis Nasal and Pinguecula, Melanosis   Cornea well healed cataract wound, tear film debris, 2+ Punctate epithelial erosions trace Punctate epithelial erosions, trace tear film debris   Anterior Chamber deep and clear, no cell or flare Deep and clear   Iris Round and dilated, No NVI Round and reactive   Lens PC IOL in good position, trace Posterior capsular opacification 2+ Nuclear sclerosis, 2-3+ Cortical cataract   Anterior Vitreous mild Vitreous syneresis, Posterior vitreous detachment mild Vitreous syneresis         Fundus Exam        Right Left   Disc 2+Pallor, +PPP    C/D Ratio 0.5 0.5   Macula good foveal reflex, stable improvement in central edema, focal edema with punctate exudates SN macula, focal punctate exudates temporal macula -- improving    Vessels attenuated, Tortuous, severe attenuation ST arcades    Periphery Attached, DBH temporal periphery -- extension of BRVO, bullous schisis cavity IT periphery (from 0700-0900) - stable; focal pigmented CR scar at 0130 midzone, no RT/RD, WWOP temporally, pigmented cystoid degeneration inferiorly            Refraction     Wearing Rx       Sphere Cylinder Axis Add   Right -0.50 +1.00 152 +2.25   Left -4.50 +0.75 019 +2.25           IMAGING AND PROCEDURES  Imaging and Procedures for @TODAY @  OCT, Retina - OU - Both Eyes       Right Eye Quality was good. Central Foveal Thickness: 234. Progression has been stable. Findings include normal foveal contour, no SRF, intraretinal hyper-reflective material, intraretinal fluid (Stable improvement in central IRF and SRF, persistent pocket of IRF/IRHM SN macula, partial PVD, focal schisis cavity IT periphery caught on widefield -- not imaged today).   Left Eye Quality was good. Central Foveal Thickness: 242. Progression has been stable. Findings include normal foveal contour, no IRF, no SRF (Partial PVD, Shallow IT Retinoschisis -- not imaged today).   Notes *Images captured and stored on drive  Diagnosis / Impression:  OD: Stable improvement in central IRF and SRF, persistent pocket of IRF/IRHM SN macula, partial PVD, focal schisis cavity IT periphery caught on widefield -- not imaged today OS: NFP, no IRF/SRF; Partial PVD, shallow IT retinoschisis -- not imaged today  Clinical management:  See below  Abbreviations: NFP - Normal foveal profile. CME - cystoid macular edema. PED - pigment epithelial detachment. IRF - intraretinal fluid. SRF - subretinal fluid. EZ - ellipsoid zone. ERM - epiretinal membrane. ORA  - outer retinal atrophy. ORT - outer retinal tubulation. SRHM - subretinal hyper-reflective material      Intravitreal Injection, Pharmacologic Agent - OD - Right Eye       Time Out 07/24/2023. 9:19 AM. Confirmed correct patient, procedure, site, and patient consented.   Anesthesia Topical anesthesia was used. Anesthetic medications included Lidocaine 2%, Proparacaine 0.5%.   Procedure Preparation included 5% betadine  to ocular surface, eyelid speculum. A supplied (32g) needle was used.   Injection: 6 mg faricimab-svoa 6 MG/0.05ML   Route: Intravitreal, Site: Right Eye   NDC: 21308-657-84, Lot: [id number], Expiration date: 09/23/2024, Waste: 0 mL   Post-op Post injection exam found visual acuity of at least counting fingers. The patient tolerated the procedure well. There were no complications. The patient received written and verbal post procedure care education. Post injection medications were not given.   Notes              ASSESSMENT/PLAN:   ICD-10-CM   1. Branch retinal vein occlusion of right eye with macular edema  H34.8310 OCT, Retina - OU - Both Eyes    Intravitreal Injection, Pharmacologic Agent - OD - Right Eye    faricimab-svoa (VABYSMO) 6mg /0.53mL intravitreal injection    2. Retinal artery branch occlusion of right eye  H34.231     3. Diabetes mellitus type 2 without retinopathy (HCC)  E11.9     4. Long term (current) use of oral hypoglycemic drugs  Z79.84     5. Current use of insulin (HCC)  Z79.4     6. Essential hypertension  I10     7. Hypertensive retinopathy of both eyes  H35.033     8. Combined forms of age-related cataract of left eye  H25.812     9. 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 BRVO with CME OD  - delayed f/u -- 7 wks instead of 5 on 01.23.25  - lost to follow up to 16 wks instead of 9 (08.10.23-11.30.23)  - lost to follow up from 6 weeks to 8 weeks 912.28.23 - 02.22.24)  - s/p IVA OD #1  (10.05.20), #2 (11.02.20), #3 (11.30.20), #4 (01.07.21), #5 (02.05.21), #6 (03.05.21), #7 (04.02.21), #8 (05.04.21), #9 (06.03.21), #10 (07.22.21), #11 (08.19.21), #12 (09.16.21) -- IVA resistance  - s/p IVE OD #1 (10.14.21), #2 (11.11.21), #3 (12.09.21), #4 (01.13.22), #5 (2.10.22), #6 (04.05.22), #7 (05.26.22), #8 (07.21.22), #9 (09.29.22), #10 (12.01.22), #11 (02.02.23), #12 (04.06.23), #13 (06.08.23), #14 (08.10.23), #15 (11.30.23), #16 (12.28.23), #17 (02.22.24), #18 (04.02.24), #19 (05.16.24), #20 (06.27.24), #21 (08.13.24), #22 (09.19.24) -- IVE resistance  - s/p IVV OD #1 (10.29.24), #2 (12.05.24), #3 (01.23.25)  **history of increased peripapillary IRF at 5 wks, noted on 09.19.24 visit**  **history of increased IRF at 10 wk interval, noted on 9.29.22 visit and 16 wks on 11.30.23 visit**  - initial exam and OCT findings suggestive of a BRAO component contributing  - BCVA OD 20/20   - exam shows stable improvement in macular edema superior macula  - OCT shows OD: Stable improvement in central IRF and SRF, persistent pocket of IRF/IRHM SN macula, partial PVD, focal schisis cavity IT periphery caught on widefield -- not imaged today at 5 weeks  - recommend IVV OD #4 today, 02.27.25 for BRVO w/ CME with f/u extended to 6 weeks  - pt wishes to proceed  - RBA of procedure discussed, questions answered  - Vabysmo informed consent obtained and signed, 10.29.24  - see procedure note  - Eylea[employer] benefits investigation started 9.16.21 -- approved for 2025  - Vabysmo is approved for 2025  - F/U 6 weeks -- DFE/OCT/possible injection, widefield OCT through schisis  3-5. Diabetes mellitus, type 2 without retinopathy - The incidence, risk factors for progression, natural history and treatment options for diabetic retinopathy  were discussed with patient.   - The need for  close monitoring of blood glucose, blood pressure, and serum lipids, avoiding cigarette or any type of tobacco, and the need for long  term follow up was also discussed with patient. - f/u in 1 year, sooner prn  6,7. Hypertensive retinopathy OU  - pt not formally diagnosed with HTN  - discussed importance of tight BP control and its relation to problems #1-2 above  - advised discussion with PCP and possible calibration of home BP cuff with PCP's  - monitor  8. Mixed form age related cataract OS  - The symptoms of cataract, surgical options, and treatments and risks were discussed with patient.  - discussed diagnosis and progression  - under the expert management of Dr. Zenaida Niece  9. Pseudophakia OD  - s/p CE/IOL (Dr. Zenaida Niece, 10.18.23)  - IOL in good position  - post - op CME + BRVO w/ CME   - monitor  10. Retinoschisis OU  - shallow schisis inferotemporal periphery OU -- stable  - stable on widefield OCT  - no RT/RD on scleral depression  - pt asymptomatic  - monitor  Ophthalmic Meds Ordered this visit:  Meds ordered this encounter  Medications   faricimab-svoa (VABYSMO) 6mg /0.62mL intravitreal injection     Return in about 6 weeks (around 09/04/2023) for f/u BRVO OD, DFE, OCT, Possible Injxn.  There are no Patient Instructions on file for this visit.   Explained the diagnoses, plan, and follow up with the patient and they expressed understanding.  Patient expressed understanding of the importance of proper follow up care.   This document serves as a record of services personally performed by Karie Chimera, MD, PhD. It was created on their behalf by Annalee Genta, COMT. The creation of this record is the provider's dictation and/or activities during the visit.  Electronically signed by: Annalee Genta, COMT 07/24/23 12:49 PM  This document serves as a record of services personally performed by Karie Chimera, MD, PhD. It was created on their behalf by Glee Arvin. Manson Passey, OA an ophthalmic technician. The creation of this record is the provider's dictation and/or activities during the visit.    Electronically signed  by: Glee Arvin. Manson Passey, OA 07/24/23 12:49 PM  Karie Chimera, M.D., Ph.D. Diseases & Surgery of the Retina and Vitreous [facility]  I have reviewed the above documentation for accuracy and completeness, and I agree with the above. Karie Chimera, M.D., Ph.D. 07/24/23 12:50 PM   Abbreviations: M myopia (nearsighted); A astigmatism; H hyperopia (farsighted); P presbyopia; Mrx spectacle prescription;  CTL contact lenses; OD right eye; OS left eye; OU both eyes  XT exotropia; ET esotropia; PEK punctate epithelial keratitis; PEE punctate epithelial erosions; DES dry eye syndrome; MGD meibomian gland dysfunction; ATs artificial tears; PFAT's preservative free artificial tears; NSC nuclear sclerotic cataract; PSC posterior subcapsular cataract; ERM epi-retinal membrane; PVD posterior vitreous detachment; RD retinal detachment; DM diabetes mellitus; DR diabetic retinopathy; NPDR non-proliferative diabetic retinopathy; PDR proliferative diabetic retinopathy; CSME clinically significant macular edema; DME diabetic macular edema; dbh dot blot hemorrhages; CWS cotton wool spot; POAG primary open angle glaucoma; C/D cup-to-disc ratio; HVF humphrey visual field; GVF goldmann visual field; OCT optical coherence tomography; IOP intraocular pressure; BRVO Branch retinal vein occlusion; CRVO central retinal vein occlusion; CRAO central retinal artery occlusion; BRAO branch retinal artery occlusion; RT retinal tear; SB scleral buckle; PPV pars plana vitrectomy; VH Vitreous hemorrhage; PRP panretinal laser photocoagulation; IVK intravitreal kenalog; VMT vitreomacular traction; MH Macular hole;  NVD neovascularization  of the disc; NVE neovascularization elsewhere; AREDS age related eye disease study; ARMD age related macular degeneration; POAG primary open angle glaucoma; EBMD epithelial/anterior basement membrane dystrophy; ACIOL anterior chamber intraocular lens; IOL intraocular lens; PCIOL posterior  chamber intraocular lens; Phaco/IOL phacoemulsification with intraocular lens placement; PRK photorefractive keratectomy; LASIK laser assisted in situ keratomileusis; HTN hypertension; DM diabetes mellitus; COPD chronic obstructive pulmonary disease

## 2023-07-24 ENCOUNTER — Encounter (INDEPENDENT_AMBULATORY_CARE_PROVIDER_SITE_OTHER): Payer: Self-pay | Admitting: Ophthalmology

## 2023-07-24 ENCOUNTER — Ambulatory Visit (INDEPENDENT_AMBULATORY_CARE_PROVIDER_SITE_OTHER): Payer: 59 | Admitting: Ophthalmology

## 2023-07-24 DIAGNOSIS — H34831 Tributary (branch) retinal vein occlusion, right eye, with macular edema: Secondary | ICD-10-CM | POA: Diagnosis not present

## 2023-07-24 DIAGNOSIS — H34231 Retinal artery branch occlusion, right eye: Secondary | ICD-10-CM | POA: Diagnosis not present

## 2023-07-24 DIAGNOSIS — Z7984 Long term (current) use of oral hypoglycemic drugs: Secondary | ICD-10-CM

## 2023-07-24 DIAGNOSIS — H35033 Hypertensive retinopathy, bilateral: Secondary | ICD-10-CM

## 2023-07-24 DIAGNOSIS — Z961 Presence of intraocular lens: Secondary | ICD-10-CM

## 2023-07-24 DIAGNOSIS — H33103 Unspecified retinoschisis, bilateral: Secondary | ICD-10-CM

## 2023-07-24 DIAGNOSIS — I10 Essential (primary) hypertension: Secondary | ICD-10-CM

## 2023-07-24 DIAGNOSIS — H25812 Combined forms of age-related cataract, left eye: Secondary | ICD-10-CM

## 2023-07-24 DIAGNOSIS — Z794 Long term (current) use of insulin: Secondary | ICD-10-CM

## 2023-07-24 DIAGNOSIS — E119 Type 2 diabetes mellitus without complications: Secondary | ICD-10-CM | POA: Diagnosis not present

## 2023-07-24 MED ORDER — FARICIMAB-SVOA 6 MG/0.05ML IZ SOSY
6.0000 mg | PREFILLED_SYRINGE | INTRAVITREAL | Status: AC | PRN
Start: 2023-07-24 — End: 2023-07-24
  Administered 2023-07-24: 6 mg via INTRAVITREAL

## 2023-09-02 NOTE — Progress Notes (Signed)
 [facility] - Clinic Note  09/04/2023     CHIEF COMPLAINT Patient presents for Retina Follow Up   HISTORY OF PRESENT ILLNESS: Jacqueline Greer is a 63 y.o. female who presents to the clinic today for:   HPI     Retina Follow Up   Patient presents with  CRVO/BRVO.  In right eye.  This started 6 weeks ago.  Duration of 6 weeks.  Since onset it is stable.  I, the attending physician,  performed the HPI with the patient and updated documentation appropriately.        Comments   Patient feels the vision is stable. She is not using eye drops at this time. Her blood sugar was 127.       Last edited by Rennis Chris, MD on 09/04/2023 10:46 AM.     Pt states   Referring physician: Luciano Cutter, PA-C [address] [place] ,  [place] [address]  HISTORICAL INFORMATION:   Selected notes from the MEDICAL RECORD NUMBER Referred by Dr. Krista Blue for concern of BRVO   CURRENT MEDICATIONS: No current outpatient medications on file. (Ophthalmic Drugs)   No current facility-administered medications for this visit. (Ophthalmic Drugs)   Current Outpatient Medications (Other)  Medication Sig   insulin degludec (TRESIBA FLEXTOUCH) 100 UNIT/ML FlexTouch Pen Inject 6 Units into the skin daily.   metformin (FORTAMET) 500 MG (OSM) 24 hr tablet Take 500 tablets by mouth 1 day or 1 dose. One table before dinner.   omeprazole (PRILOSEC) 20 MG capsule Take 20 mg by mouth daily.   No current facility-administered medications for this visit. (Other)   REVIEW OF SYSTEMS: ROS   Positive for: Endocrine, Eyes Negative for: Constitutional, Gastrointestinal, Neurological, Skin, Genitourinary, Musculoskeletal, HENT, Cardiovascular, Respiratory, Psychiatric, Allergic/Imm, Heme/Lymph Last edited by Charlette Caffey, COT on 09/04/2023  8:08 AM.         ALLERGIES No Known Allergies  PAST MEDICAL HISTORY Past Medical History:  Diagnosis Date   Cataract    OU    Hypertensive retinopathy    OU   Past Surgical History:  Procedure Laterality Date   LAPAROSCOPIC TUBAL LIGATION  2003   FAMILY HISTORY Family History  Problem Relation Age of Onset   Diabetes Mother    Kidney failure Father    SOCIAL HISTORY Social History   Tobacco Use   Smoking status: Never   Smokeless tobacco: Never  Vaping Use   Vaping status: Never Used  Substance Use Topics   Alcohol use: No   Drug use: No       OPHTHALMIC EXAM:  Base Eye Exam     Visual Acuity (Snellen - Linear)       Right Left   Dist cc 20/20 20/20    Correction: Glasses         Tonometry (Tonopen, 8:10 AM)       Right Left   Pressure 22 19         Pupils       Dark Light Shape React APD   Right 4 3 Round Brisk None   Left 4 3 Round Brisk None         Visual Fields       Left Right    Full Full         Extraocular Movement       Right Left    Full, Ortho Full, Ortho         Neuro/Psych  Oriented x3: Yes   Mood/Affect: Normal         Dilation     Right eye: 1.0% Mydriacyl, 2.5% Phenylephrine @ 8:08 AM  Patient refused dilation OS          Slit Lamp and Fundus Exam     Slit Lamp Exam       Right Left   Lids/Lashes Dermatochalasis - upper lid Normal   Conjunctiva/Sclera Nasal and temporal Pinguecula, Melanosis Nasal and Pinguecula, Melanosis   Cornea well healed cataract wound, tear film debris, 2+ Punctate epithelial erosions trace Punctate epithelial erosions, trace tear film debris   Anterior Chamber deep and clear, no cell or flare Deep and clear   Iris Round and dilated, No NVI Round and reactive   Lens PC IOL in good position, trace Posterior capsular opacification 2+ Nuclear sclerosis, 2-3+ Cortical cataract   Anterior Vitreous mild Vitreous syneresis, Posterior vitreous detachment mild Vitreous syneresis         Fundus Exam       Right Left   Disc 2+Pallor, +PPP    C/D Ratio 0.5 0.5   Macula good foveal reflex, stable  improvement in central edema, focal edema with punctate exudates SN macula -- improving, focal punctate exudates temporal macula -- improved    Vessels attenuated, Tortuous, severe attenuation ST arcades    Periphery Attached, DBH temporal periphery -- extension of BRVO, bullous schisis cavity IT periphery (from 0700-0900) - stable; focal pigmented CR scar at 0130 midzone, no RT/RD, WWOP temporally, pigmented cystoid degeneration inferiorly            IMAGING AND PROCEDURES  Imaging and Procedures for @TODAY @  OCT, Retina - OU - Both Eyes       Right Eye Quality was good. Central Foveal Thickness: 240. Progression has improved. Findings include normal foveal contour, no SRF, intraretinal hyper-reflective material, intraretinal fluid (Stable improvement in central IRF and SRF, persistent focal IRF/IRHM SN macula -- improved, partial PVD, focal schisis cavity IT periphery caught on widefield -- not imaged today).   Left Eye Quality was good. Central Foveal Thickness: 248. Progression has been stable. Findings include normal foveal contour, no IRF, no SRF (Partial PVD, Shallow IT Retinoschisis -- not imaged today).   Notes *Images captured and stored on drive  Diagnosis / Impression:  OD: Stable improvement in central IRF and SRF, persistent focal IRF/IRHM SN macula -- improved, partial PVD, focal schisis cavity IT periphery caught on widefield -- not imaged today OS: NFP, no IRF/SRF; Partial PVD, shallow IT retinoschisis -- not imaged today  Clinical management:  See below  Abbreviations: NFP - Normal foveal profile. CME - cystoid macular edema. PED - pigment epithelial detachment. IRF - intraretinal fluid. SRF - subretinal fluid. EZ - ellipsoid zone. ERM - epiretinal membrane. ORA - outer retinal atrophy. ORT - outer retinal tubulation. SRHM - subretinal hyper-reflective material      Intravitreal Injection, Pharmacologic Agent - OD - Right Eye       Time Out 09/04/2023. 9:09  AM. Confirmed correct patient, procedure, site, and patient consented.   Anesthesia Topical anesthesia was used. Anesthetic medications included Lidocaine 2%, Proparacaine 0.5%.   Procedure Preparation included 5% betadine to ocular surface, eyelid speculum. A supplied (32g) needle was used.   Injection: 6 mg faricimab-svoa 6 MG/0.05ML   Route: Intravitreal, Site: Right Eye   NDC: 95284-132-44, Lot: [id number], Expiration date: 09/23/2024, Waste: 0 mL   Post-op Post injection exam found visual acuity of at least counting  fingers. The patient tolerated the procedure well. There were no complications. The patient received written and verbal post procedure care education. Post injection medications were not given.   Notes             ASSESSMENT/PLAN:   ICD-10-CM   1. Branch retinal vein occlusion of right eye with macular edema  H34.8310 OCT, Retina - OU - Both Eyes    Intravitreal Injection, Pharmacologic Agent - OD - Right Eye    faricimab-svoa (VABYSMO) 6mg /0.24mL intravitreal injection    2. Retinal artery branch occlusion of right eye  H34.231     3. Diabetes mellitus type 2 without retinopathy (HCC)  E11.9     4. Long term (current) use of oral hypoglycemic drugs  Z79.84     5. Current use of insulin (HCC)  Z79.4     6. Essential hypertension  I10     7. Hypertensive retinopathy of both eyes  H35.033     8. Combined forms of age-related cataract of left eye  H25.812     9. 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 BRVO with CME OD  - delayed f/u -- 7 wks instead of 5 on 01.23.25  - lost to follow up to 16 wks instead of 9 (08.10.23-11.30.23)  - lost to follow up from 6 weeks to 8 weeks 912.28.23 - 02.22.24)  - s/p IVA OD #1 (10.05.20), #2 (11.02.20), #3 (11.30.20), #4 (01.07.21), #5 (02.05.21), #6 (03.05.21), #7 (04.02.21), #8 (05.04.21), #9 (06.03.21), #10 (07.22.21), #11 (08.19.21), #12 (09.16.21) -- IVA resistance  - s/p IVE OD #1  (10.14.21), #2 (11.11.21), #3 (12.09.21), #4 (01.13.22), #5 (2.10.22), #6 (04.05.22), #7 (05.26.22), #8 (07.21.22), #9 (09.29.22), #10 (12.01.22), #11 (02.02.23), #12 (04.06.23), #13 (06.08.23), #14 (08.10.23), #15 (11.30.23), #16 (12.28.23), #17 (02.22.24), #18 (04.02.24), #19 (05.16.24), #20 (06.27.24), #21 (08.13.24), #22 (09.19.24) -- IVE resistance  - s/p IVV OD #1 (10.29.24), #2 (12.05.24), #3 (01.23.25), #4 (02.27.25)  **history of increased peripapillary IRF at 5 wks, noted on 09.19.24 visit**  **history of increased IRF at 10 wk interval, noted on 9.29.22 visit and 16 wks on 11.30.23 visit**  - initial exam and OCT findings suggestive of a BRAO component contributing  - BCVA OD 20/20   - exam shows stable improvement in macular edema superior macula  - OCT shows OD: Stable improvement in central IRF and SRF, persistent focal IRF/IRHM SN macula -- improved, partial PVD, focal schisis cavity IT periphery caught on widefield -- not imaged today at 6 weeks  - recommend IVV OD #5 today, 04.10.25 for BRVO w/ CME with f/u extended to 7 weeks  - pt wishes to proceed  - RBA of procedure discussed, questions answered  - Vabysmo informed consent obtained and signed, 10.29.24  - see procedure note  - Eyle[employer] benefits investigation started 9.16.21 -- approved for 2025  - Vabysmo is approved for 2025  - F/U 7 weeks -- DFE/OCT/possible injection, widefield OCT through schisis  3-5. Diabetes mellitus, type 2 without retinopathy - The incidence, risk factors for progression, natural history and treatment options for diabetic retinopathy  were discussed with patient.   - The need for close monitoring of blood glucose, blood pressure, and serum lipids, avoiding cigarette or any type of tobacco, and the need for long term follow up was also discussed with patient. - f/u in 1 year, sooner prn  6,7. Hypertensive retinopathy OU  - pt not formally diagnosed with  HTN  - discussed importance of tight BP  control and its relation to problems #1-2 above  - advised discussion with PCP and possible calibration of home BP cuff with PCP's  - monitor  8. Mixed form age related cataract OS  - The symptoms of cataract, surgical options, and treatments and risks were discussed with patient.  - discussed diagnosis and progression  - under the expert management of Dr. Zenaida Niece  9. Pseudophakia OD  - s/p CE/IOL (Dr. Zenaida Niece, 10.18.23)  - IOL in good position  - post - op CME + BRVO w/ CME   - monitor  10. Retinoschisis OU  - shallow schisis inferotemporal periphery OU -- stable  - stable on widefield OCT  - no RT/RD on scleral depression  - pt asymptomatic  - monitor  Ophthalmic Meds Ordered this visit:  Meds ordered this encounter  Medications   faricimab-svoa (VABYSMO) 6mg /0.22mL intravitreal injection     Return in about 7 weeks (around 10/23/2023) for f/u BRVO OD, DFE, OCT, Possible Injxn.  There are no Patient Instructions on file for this visit.   Explained the diagnoses, plan, and follow up with the patient and they expressed understanding.  Patient expressed understanding of the importance of proper follow up care.   This document serves as a record of services personally performed by Karie Chimera, MD, PhD. It was created on their behalf by Annalee Genta, COMT. The creation of this record is the provider's dictation and/or activities during the visit.  Electronically signed by: Annalee Genta, COMT 09/04/23 10:48 AM  This document serves as a record of services personally performed by Karie Chimera, MD, PhD. It was created on their behalf by Glee Arvin. Manson Passey, OA an ophthalmic technician. The creation of this record is the provider's dictation and/or activities during the visit.    Electronically signed by: Glee Arvin. Manson Passey, OA 09/04/23 10:48 AM  Karie Chimera, M.D., Ph.D. Diseases & Surgery of the Retina and Vitreous [facility]  I have reviewed the above  documentation for accuracy and completeness, and I agree with the above. Karie Chimera, M.D., Ph.D. 09/04/23 10:49 AM   Abbreviations: M myopia (nearsighted); A astigmatism; H hyperopia (farsighted); P presbyopia; Mrx spectacle prescription;  CTL contact lenses; OD right eye; OS left eye; OU both eyes  XT exotropia; ET esotropia; PEK punctate epithelial keratitis; PEE punctate epithelial erosions; DES dry eye syndrome; MGD meibomian gland dysfunction; ATs artificial tears; PFAT's preservative free artificial tears; NSC nuclear sclerotic cataract; PSC posterior subcapsular cataract; ERM epi-retinal membrane; PVD posterior vitreous detachment; RD retinal detachment; DM diabetes mellitus; DR diabetic retinopathy; NPDR non-proliferative diabetic retinopathy; PDR proliferative diabetic retinopathy; CSME clinically significant macular edema; DME diabetic macular edema; dbh dot blot hemorrhages; CWS cotton wool spot; POAG primary open angle glaucoma; C/D cup-to-disc ratio; HVF humphrey visual field; GVF goldmann visual field; OCT optical coherence tomography; IOP intraocular pressure; BRVO Branch retinal vein occlusion; CRVO central retinal vein occlusion; CRAO central retinal artery occlusion; BRAO branch retinal artery occlusion; RT retinal tear; SB scleral buckle; PPV pars plana vitrectomy; VH Vitreous hemorrhage; PRP panretinal laser photocoagulation; IVK intravitreal kenalog; VMT vitreomacular traction; MH Macular hole;  NVD neovascularization of the disc; NVE neovascularization elsewhere; AREDS age related eye disease study; ARMD age related macular degeneration; POAG primary open angle glaucoma; EBMD epithelial/anterior basement membrane dystrophy; ACIOL anterior chamber intraocular lens; IOL intraocular lens; PCIOL posterior chamber intraocular lens; Phaco/IOL phacoemulsification with intraocular lens placement; PRK photorefractive keratectomy;  LASIK laser assisted in situ keratomileusis; HTN hypertension; DM  diabetes mellitus; COPD chronic obstructive pulmonary disease

## 2023-09-04 ENCOUNTER — Ambulatory Visit (INDEPENDENT_AMBULATORY_CARE_PROVIDER_SITE_OTHER): Payer: 59 | Admitting: Ophthalmology

## 2023-09-04 ENCOUNTER — Encounter (INDEPENDENT_AMBULATORY_CARE_PROVIDER_SITE_OTHER): Payer: Self-pay | Admitting: Ophthalmology

## 2023-09-04 DIAGNOSIS — H33103 Unspecified retinoschisis, bilateral: Secondary | ICD-10-CM

## 2023-09-04 DIAGNOSIS — I10 Essential (primary) hypertension: Secondary | ICD-10-CM

## 2023-09-04 DIAGNOSIS — E119 Type 2 diabetes mellitus without complications: Secondary | ICD-10-CM | POA: Diagnosis not present

## 2023-09-04 DIAGNOSIS — H34831 Tributary (branch) retinal vein occlusion, right eye, with macular edema: Secondary | ICD-10-CM

## 2023-09-04 DIAGNOSIS — Z961 Presence of intraocular lens: Secondary | ICD-10-CM

## 2023-09-04 DIAGNOSIS — H34231 Retinal artery branch occlusion, right eye: Secondary | ICD-10-CM

## 2023-09-04 DIAGNOSIS — H35033 Hypertensive retinopathy, bilateral: Secondary | ICD-10-CM

## 2023-09-04 DIAGNOSIS — Z7984 Long term (current) use of oral hypoglycemic drugs: Secondary | ICD-10-CM | POA: Diagnosis not present

## 2023-09-04 DIAGNOSIS — H25812 Combined forms of age-related cataract, left eye: Secondary | ICD-10-CM

## 2023-09-04 DIAGNOSIS — Z794 Long term (current) use of insulin: Secondary | ICD-10-CM

## 2023-09-04 MED ORDER — FARICIMAB-SVOA 6 MG/0.05ML IZ SOSY
6.0000 mg | PREFILLED_SYRINGE | INTRAVITREAL | Status: AC | PRN
  Administered 2023-09-04: 6 mg via INTRAVITREAL

## 2023-10-21 NOTE — Progress Notes (Signed)
 [facility] - Clinic Note  10/23/2023     CHIEF COMPLAINT Patient presents for Retina Follow Up   HISTORY OF PRESENT ILLNESS: Jacqueline Greer is a 63 y.o. female who presents to the clinic today for:   HPI     Retina Follow Up   Patient presents with  CRVO/BRVO.  In right eye.  This started 7 weeks ago.  Duration of 7 weeks.  Since onset it is stable.  I, the attending physician,  performed the HPI with the patient and updated documentation appropriately.        Comments   Patient feels the vision is the same. She is not using eye drops. Her blood sugar was 120.      Last edited by Ronelle Coffee, MD on 10/23/2023 10:38 AM.      Pt states   Referring physician: Artemio Larry, PA-C [address] [place] ,  [place] [address]  HISTORICAL INFORMATION:   Selected notes from the MEDICAL RECORD NUMBER Referred by Dr. Demarco for concern of BRVO   CURRENT MEDICATIONS: No current outpatient medications on file. (Ophthalmic Drugs)   No current facility-administered medications for this visit. (Ophthalmic Drugs)   Current Outpatient Medications (Other)  Medication Sig   insulin degludec (TRESIBA FLEXTOUCH) 100 UNIT/ML FlexTouch Pen Inject 6 Units into the skin daily.   metformin (FORTAMET) 500 MG (OSM) 24 hr tablet Take 500 tablets by mouth 1 day or 1 dose. One table before dinner.   omeprazole (PRILOSEC) 20 MG capsule Take 20 mg by mouth daily.   No current facility-administered medications for this visit. (Other)   REVIEW OF SYSTEMS: ROS   Positive for: Endocrine, Eyes Negative for: Constitutional, Gastrointestinal, Neurological, Skin, Genitourinary, Musculoskeletal, HENT, Cardiovascular, Respiratory, Psychiatric, Allergic/Imm, Heme/Lymph Last edited by Olene Berne, COT on 10/23/2023  8:21 AM.     ALLERGIES No Known Allergies  PAST MEDICAL HISTORY Past Medical History:  Diagnosis Date   Cataract    OU   Hypertensive  retinopathy    OU   Past Surgical History:  Procedure Laterality Date   LAPAROSCOPIC TUBAL LIGATION  2003   FAMILY HISTORY Family History  Problem Relation Age of Onset   Diabetes Mother    Kidney failure Father    SOCIAL HISTORY Social History   Tobacco Use   Smoking status: Never   Smokeless tobacco: Never  Vaping Use   Vaping status: Never Used  Substance Use Topics   Alcohol use: No   Drug use: No       OPHTHALMIC EXAM:  Base Eye Exam     Visual Acuity (Snellen - Linear)       Right Left   Dist cc 20/20 20/20    Correction: Glasses         Tonometry (Tonopen, 8:24 AM)       Right Left   Pressure 21 22         Pupils       Dark Light Shape React APD   Right 4 3 Round Brisk None   Left 4 3 Round Brisk None         Visual Fields       Left Right    Full Full         Extraocular Movement       Right Left    Full, Ortho Full, Ortho         Neuro/Psych     Oriented x3: Yes  Mood/Affect: Normal         Dilation     Right eye: 1.0% Mydriacyl, 2.5% Phenylephrine @ 8:22 AM           Slit Lamp and Fundus Exam     Slit Lamp Exam       Right Left   Lids/Lashes Dermatochalasis - upper lid Normal   Conjunctiva/Sclera Nasal and temporal Pinguecula, Melanosis Nasal and Pinguecula, Melanosis   Cornea well healed cataract wound, tear film debris, 2+ Punctate epithelial erosions trace Punctate epithelial erosions, trace tear film debris   Anterior Chamber deep and clear, no cell or flare Deep and clear   Iris Round and dilated, No NVI Round and reactive   Lens PC IOL in good position, trace Posterior capsular opacification 2+ Nuclear sclerosis, 2-3+ Cortical cataract   Anterior Vitreous mild Vitreous syneresis, Posterior vitreous detachment mild Vitreous syneresis         Fundus Exam       Right Left   Disc 2+Pallor, +PPP    C/D Ratio 0.5 0.5   Macula good foveal reflex, stable improvement in central edema, focal edema  with punctate exudates SN and temporal macula -- slightly increased, focal punctate exudates temporal macula -- improved    Vessels attenuated, Tortuous, severe attenuation ST arcades    Periphery Attached, DBH temporal periphery -- extension of BRVO, bullous schisis cavity IT periphery (from 0700-0900) - stable; focal pigmented CR scar at 0130 midzone, no RT/RD, WWOP temporally, pigmented cystoid degeneration inferiorly, scattered MA and punctate exudates            Refraction     Wearing Rx       Sphere Cylinder Axis Add   Right -0.50 +1.00 152 +2.25   Left -4.50 +0.75 019 +2.25           IMAGING AND PROCEDURES  Imaging and Procedures for @TODAY @  OCT, Retina - OU - Both Eyes        Right Eye Quality was good. Central Foveal Thickness: 245. Progression has worsened. Findings include normal foveal contour, no SRF, intraretinal hyper-reflective material, intraretinal fluid (Stable improvement in central IRF and SRF, persistent focal IRF/IRHM superior macula -- slightly increased, partial PVD, focal schisis cavity IT periphery caught on widefield).   Left Eye Quality was good. Central Foveal Thickness: 251. Progression has been stable. Findings include normal foveal contour, no IRF, no SRF (Partial PVD, Shallow IT Retinoschisis -- not imaged today).   Notes  *Images captured and stored on drive  Diagnosis / Impression:  OD: Stable improvement in central IRF and SRF, persistent focal IRF/IRHM superior macula -- slightly increased, partial PVD, focal schisis cavity IT periphery caught on widefield  OS: NFP, no IRF/SRF; Partial PVD, shallow IT retinoschisis   Clinical management:  See below  Abbreviations: NFP - Normal foveal profile. CME - cystoid macular edema. PED - pigment epithelial detachment. IRF - intraretinal fluid. SRF - subretinal fluid. EZ - ellipsoid zone. ERM - epiretinal membrane. ORA - outer retinal atrophy. ORT - outer retinal tubulation. SRHM - subretinal  hyper-reflective material      Intravitreal Injection, Pharmacologic Agent - OD - Right Eye       Time Out 10/23/2023. 8:56 AM. Confirmed correct patient, procedure, site, and patient consented.   Anesthesia Topical anesthesia was used. Anesthetic medications included Lidocaine 2%, Proparacaine 0.5%.   Procedure Preparation included 5% betadine to ocular surface, eyelid speculum. A supplied (32g) needle was used.   Injection: 6 mg faricimab -svoa  6 MG/0.05ML   Route: Intravitreal, Site: Right Eye   NDC: [id number], Lot: [id number], Expiration date: 10/24/2024, Waste: 0 mL   Post-op Post injection exam found visual acuity of at least counting fingers. The patient tolerated the procedure well. There were no complications. The patient received written and verbal post procedure care education. Post injection medications were not given.            ASSESSMENT/PLAN:   ICD-10-CM   1. Branch retinal vein occlusion of right eye with macular edema  H34.8310 OCT, Retina - OU - Both Eyes    Intravitreal Injection, Pharmacologic Agent - OD - Right Eye    faricimab -svoa (VABYSMO ) 6mg /0.5mL intravitreal injection    2. Retinal artery branch occlusion of right eye  H34.231     3. Diabetes mellitus type 2 without retinopathy (HCC)  E11.9     4. Long term (current) use of oral hypoglycemic drugs  Z79.84     5. Current use of insulin (HCC)  Z79.4     6. Essential hypertension  I10     7. Hypertensive retinopathy of both eyes  H35.033     8. Combined forms of age-related cataract of left eye  H25.812     9. 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 BRVO with CME OD  - delayed f/u -- 7 wks instead of 5 on 01.23.25  - lost to follow up to 16 wks instead of 9 (08.10.23-11.30.23)  - lost to follow up from 6 weeks to 8 weeks 912.28.23 - 02.22.24)  - s/p IVA OD #1 (10.05.20), #2 (11.02.20), #3 (11.30.20), #4 (01.07.21), #5 (02.05.21), #6 (03.05.21), #7  (04.02.21), #8 (05.04.21), #9 (06.03.21), #10 (07.22.21), #11 (08.19.21), #12 (09.16.21) -- IVA resistance  - s/p IVE OD #1 (10.14.21), #2 (11.11.21), #3 (12.09.21), #4 (01.13.22), #5 (2.10.22), #6 (04.05.22), #7 (05.26.22), #8 (07.21.22), #9 (09.29.22), #10 (12.01.22), #11 (02.02.23), #12 (04.06.23), #13 (06.08.23), #14 (08.10.23), #15 (11.30.23), #16 (12.28.23), #17 (02.22.24), #18 (04.02.24), #19 (05.16.24), #20 (06.27.24), #21 (08.13.24), #22 (09.19.24) -- IVE resistance  - s/p IVV OD #1 (10.29.24), #2 (12.05.24), #3 (01.23.25), #4 (02.27.25), #5 (04.10.25)  **history of increased peripapillary IRF at 5 wks, noted on 09.19.24 visit**  **history of increased IRF at 7 weeks, noted on 05.29.25 exam**  **history of increased IRF at 10 wk interval, noted on 9.29.22 visit** **history of increased IRF at 16 wks on 11.30.23 visit**  - initial exam and OCT findings suggestive of a BRAO component contributing  - BCVA OD 20/20   - exam shows stable improvement in macular edema superior macula  - OCT shows Stable improvement in central IRF and SRF, persistent focal IRF/IRHM superior macula -- slightly increased, partial PVD, focal schisis cavity IT periphery caught on widefield at 7 wks  - recommend IVV OD #6 today, 05.29.25 for BRVO w/ CME with f/u back to 6 weeks  - pt wishes to proceed  - RBA of procedure discussed, questions answered  - Vabysmo  informed consent obtained and signed, 10.29.24  - see procedure note  - Eylea4U benefits investigation started 9.16.21 -- approved for 2025  - Vabysmo  is approved for 2025  - F/U 6 weeks -- DFE/OCT/possible injection, widefield OCT through schisis  3-5. Moderate nonproliferative diabetic retinopathy OU  - A1c 6.7 on 05.14.25 - The incidence, risk factors for progression, natural history and treatment options for diabetic retinopathy were discussed with patient.   - The need for close  monitoring of blood glucose, blood pressure, and serum lipids, avoiding  cigarette or any type of tobacco, and the need for long term follow up was also discussed with patient. - exam shows scattered MA and punctate exudates OU - discussed possible DR component contributing to edema OD and possible benefit of switching medication to IVE HD OD - will check IVE HD auth - f/u 6 wks -- DFE/OCT, possible injxn  6,7. Hypertensive retinopathy OU  - pt not formally diagnosed with HTN  - discussed importance of tight BP control and its relation to problems #1-2 above  - advised discussion with PCP and possible calibration of home BP cuff with PCP's  - monitor  8. Mixed form age related cataract OS  - The symptoms of cataract, surgical options, and treatments and risks were discussed with patient.  - discussed diagnosis and progression  - under the expert management of Dr. Carloyn Chi  9. Pseudophakia OD  - s/p CE/IOL (Dr. Carloyn Chi, 10.18.23)  - IOL in good position  - post - op CME + BRVO w/ CME   - monitor  10. Retinoschisis OU  - shallow schisis inferotemporal periphery OU -- stable  - stable on widefield OCT  - no RT/RD on scleral depression  - pt asymptomatic  - monitor  Ophthalmic Meds Ordered this visit:  Meds ordered this encounter  Medications   faricimab -svoa (VABYSMO ) 6mg /0.78mL intravitreal injection     Return in about 6 weeks (around 12/04/2023) for f/u BRVO OD, DFE, OCT, Possible Injxn.  There are no Patient Instructions on file for this visit.   Explained the diagnoses, plan, and follow up with the patient and they expressed understanding.  Patient expressed understanding of the importance of proper follow up care.   This document serves as a record of services personally performed by Jeanice Millard, MD, PhD. It was created on their behalf by Diona Franklin, COMT. The creation of this record is the provider's dictation and/or activities during the visit.  Electronically signed by: Diona Franklin, COMT 10/23/23 10:39 AM  This document serves as a record  of services personally performed by Jeanice Millard, MD, PhD. It was created on their behalf by Morley Arabia. Bevin Bucks, OA an ophthalmic technician. The creation of this record is the provider's dictation and/or activities during the visit.    Electronically signed by: Morley Arabia. Bevin Bucks, OA 10/23/23 10:39 AM  Jeanice Millard, M.D., Ph.D. Diseases & Surgery of the Retina and Vitreous [facility]  I have reviewed the above documentation for accuracy and completeness, and I agree with the above. Jeanice Millard, M.D., Ph.D. 10/23/23 10:42 AM   Abbreviations: M myopia (nearsighted); A astigmatism; H hyperopia (farsighted); P presbyopia; Mrx spectacle prescription;  CTL contact lenses; OD right eye; OS left eye; OU both eyes  XT exotropia; ET esotropia; PEK punctate epithelial keratitis; PEE punctate epithelial erosions; DES dry eye syndrome; MGD meibomian gland dysfunction; ATs artificial tears; PFAT's preservative free artificial tears; NSC nuclear sclerotic cataract; PSC posterior subcapsular cataract; ERM epi-retinal membrane; PVD posterior vitreous detachment; RD retinal detachment; DM diabetes mellitus; DR diabetic retinopathy; NPDR non-proliferative diabetic retinopathy; PDR proliferative diabetic retinopathy; CSME clinically significant macular edema; DME diabetic macular edema; dbh dot blot hemorrhages; CWS cotton wool spot; POAG primary open angle glaucoma; C/D cup-to-disc ratio; HVF humphrey visual field; GVF goldmann visual field; OCT optical coherence tomography; IOP intraocular pressure; BRVO Branch retinal vein occlusion; CRVO central retinal vein occlusion; CRAO central retinal artery occlusion;  BRAO branch retinal artery occlusion; RT retinal tear; SB scleral buckle; PPV pars plana vitrectomy; VH Vitreous hemorrhage; PRP panretinal laser photocoagulation; IVK intravitreal kenalog; VMT vitreomacular traction; MH Macular hole;  NVD neovascularization of the disc; NVE  neovascularization elsewhere; AREDS age related eye disease study; ARMD age related macular degeneration; POAG primary open angle glaucoma; EBMD epithelial/anterior basement membrane dystrophy; ACIOL anterior chamber intraocular lens; IOL intraocular lens; PCIOL posterior chamber intraocular lens; Phaco/IOL phacoemulsification with intraocular lens placement; PRK photorefractive keratectomy; LASIK laser assisted in situ keratomileusis; HTN hypertension; DM diabetes mellitus; COPD chronic obstructive pulmonary disease

## 2023-10-23 ENCOUNTER — Encounter (INDEPENDENT_AMBULATORY_CARE_PROVIDER_SITE_OTHER): Payer: Self-pay | Admitting: Ophthalmology

## 2023-10-23 ENCOUNTER — Ambulatory Visit (INDEPENDENT_AMBULATORY_CARE_PROVIDER_SITE_OTHER): Admitting: Ophthalmology

## 2023-10-23 DIAGNOSIS — H34231 Retinal artery branch occlusion, right eye: Secondary | ICD-10-CM | POA: Diagnosis not present

## 2023-10-23 DIAGNOSIS — H25812 Combined forms of age-related cataract, left eye: Secondary | ICD-10-CM

## 2023-10-23 DIAGNOSIS — E119 Type 2 diabetes mellitus without complications: Secondary | ICD-10-CM | POA: Diagnosis not present

## 2023-10-23 DIAGNOSIS — H34831 Tributary (branch) retinal vein occlusion, right eye, with macular edema: Secondary | ICD-10-CM

## 2023-10-23 DIAGNOSIS — Z794 Long term (current) use of insulin: Secondary | ICD-10-CM

## 2023-10-23 DIAGNOSIS — Z961 Presence of intraocular lens: Secondary | ICD-10-CM

## 2023-10-23 DIAGNOSIS — Z7984 Long term (current) use of oral hypoglycemic drugs: Secondary | ICD-10-CM

## 2023-10-23 DIAGNOSIS — I10 Essential (primary) hypertension: Secondary | ICD-10-CM

## 2023-10-23 DIAGNOSIS — E113393 Type 2 diabetes mellitus with moderate nonproliferative diabetic retinopathy without macular edema, bilateral: Secondary | ICD-10-CM | POA: Diagnosis not present

## 2023-10-23 DIAGNOSIS — H33103 Unspecified retinoschisis, bilateral: Secondary | ICD-10-CM

## 2023-10-23 DIAGNOSIS — H35033 Hypertensive retinopathy, bilateral: Secondary | ICD-10-CM

## 2023-10-23 MED ORDER — FARICIMAB-SVOA 6 MG/0.05ML IZ SOSY
6.0000 mg | PREFILLED_SYRINGE | INTRAVITREAL | Status: AC | PRN
  Administered 2023-10-23: 6 mg via INTRAVITREAL

## 2023-10-24 NOTE — Progress Notes (Signed)
 [facility] - Clinic Note  10/23/2023     CHIEF COMPLAINT Patient presents for Retina Follow Up   HISTORY OF PRESENT ILLNESS: Jacqueline Greer is a 63 y.o. female who presents to the clinic today for:   HPI     Retina Follow Up           Diagnosis: CRVO/BRVO   Laterality: right eye   Onset: 7 weeks ago   Duration: 7 weeks   Course: stable   MD Performed: performed the HPI with the patient and updated documentation appropriately         Comments   Patient feels the vision is the same. She is not using eye drops. Her blood sugar was 120.      Last edited by Ronelle Coffee, MD on 10/23/2023 10:38 AM.      Pt states   Referring physician: Artemio Larry, PA-C [address] [place] ,  [place] [address]  HISTORICAL INFORMATION:   Selected notes from the MEDICAL RECORD NUMBER Referred by Dr. Demarco for concern of BRVO   CURRENT MEDICATIONS: No current outpatient medications on file. (Ophthalmic Drugs)   No current facility-administered medications for this visit. (Ophthalmic Drugs)   Current Outpatient Medications (Other)  Medication Sig   insulin degludec (TRESIBA FLEXTOUCH) 100 UNIT/ML FlexTouch Pen Inject 6 Units into the skin daily.   metformin (FORTAMET) 500 MG (OSM) 24 hr tablet Take 500 tablets by mouth 1 day or 1 dose. One table before dinner.   omeprazole (PRILOSEC) 20 MG capsule Take 20 mg by mouth daily.   No current facility-administered medications for this visit. (Other)   REVIEW OF SYSTEMS: ROS   Positive for: Endocrine, Eyes Negative for: Constitutional, Gastrointestinal, Neurological, Skin, Genitourinary, Musculoskeletal, HENT, Cardiovascular, Respiratory, Psychiatric, Allergic/Imm, Heme/Lymph Last edited by Olene Berne, COT on 10/23/2023  8:21 AM.     ALLERGIES No Known Allergies  PAST MEDICAL HISTORY Past Medical History:  Diagnosis Date   Cataract    OU   Hypertensive retinopathy    OU   Past  Surgical History:  Procedure Laterality Date   LAPAROSCOPIC TUBAL LIGATION  2003   FAMILY HISTORY Family History  Problem Relation Age of Onset   Diabetes Mother    Kidney failure Father    SOCIAL HISTORY Social History   Tobacco Use   Smoking status: Never   Smokeless tobacco: Never  Vaping Use   Vaping status: Never Used  Substance Use Topics   Alcohol use: No   Drug use: No       OPHTHALMIC EXAM:  Base Eye Exam     Visual Acuity (Snellen - Linear)       Right Left   Dist cc 20/20 20/20    Correction: Glasses         Tonometry (Tonopen, 8:24 AM)       Right Left   Pressure 21 22         Pupils       Dark Light Shape React APD   Right 4 3 Round Brisk None   Left 4 3 Round Brisk None         Visual Fields       Left Right    Full Full         Extraocular Movement       Right Left    Full, Ortho Full, Ortho         Neuro/Psych     Oriented x3:  Yes   Mood/Affect: Normal         Dilation     Right eye: 1.0% Mydriacyl, 2.5% Phenylephrine @ 8:22 AM           Slit Lamp and Fundus Exam     Slit Lamp Exam       Right Left   Lids/Lashes Dermatochalasis - upper lid Normal   Conjunctiva/Sclera Nasal and temporal Pinguecula, Melanosis Nasal and Pinguecula, Melanosis   Cornea well healed cataract wound, tear film debris, 2+ Punctate epithelial erosions trace Punctate epithelial erosions, trace tear film debris   Anterior Chamber deep and clear, no cell or flare Deep and clear   Iris Round and dilated, No NVI Round and reactive   Lens PC IOL in good position, trace Posterior capsular opacification 2+ Nuclear sclerosis, 2-3+ Cortical cataract   Vitreous mild Vitreous syneresis, Posterior vitreous detachment mild Vitreous syneresis         Fundus Exam       Right Left   Disc 2+Pallor, +PPP    C/D Ratio 0.5 0.5   Macula good foveal reflex, stable improvement in central edema, focal edema with punctate exudates SN and temporal  macula -- slightly increased, focal punctate exudates temporal macula -- improved    Vessels attenuated, Tortuous, severe attenuation ST arcades    Periphery Attached, DBH temporal periphery -- extension of BRVO, bullous schisis cavity IT periphery (from 0700-0900) - stable; focal pigmented CR scar at 0130 midzone, no RT/RD, WWOP temporally, pigmented cystoid degeneration inferiorly, scattered MA and punctate exudates            Refraction     Wearing Rx       Sphere Cylinder Axis Add   Right -0.50 +1.00 152 +2.25   Left -4.50 +0.75 019 +2.25           IMAGING AND PROCEDURES  Imaging and Procedures for @TODAY @  OCT, Retina - OU - Both Eyes        Right Eye Quality was good. Central Foveal Thickness: 245. Progression has worsened. Findings include normal foveal contour, no SRF, intraretinal hyper-reflective material, intraretinal fluid (Stable improvement in central IRF and SRF, persistent focal IRF/IRHM superior macula -- slightly increased, partial PVD, focal schisis cavity IT periphery caught on widefield).   Left Eye Quality was good. Central Foveal Thickness: 251. Progression has been stable. Findings include normal foveal contour, no IRF, no SRF (Partial PVD, Shallow IT Retinoschisis -- not imaged today).   Notes  *Images captured and stored on drive  Diagnosis / Impression:  OD: Stable improvement in central IRF and SRF, persistent focal IRF/IRHM superior macula -- slightly increased, partial PVD, focal schisis cavity IT periphery caught on widefield  OS: NFP, no IRF/SRF; Partial PVD, shallow IT retinoschisis   Clinical management:  See below  Abbreviations: NFP - Normal foveal profile. CME - cystoid macular edema. PED - pigment epithelial detachment. IRF - intraretinal fluid. SRF - subretinal fluid. EZ - ellipsoid zone. ERM - epiretinal membrane. ORA - outer retinal atrophy. ORT - outer retinal tubulation. SRHM - subretinal hyper-reflective material       Intravitreal Injection, Pharmacologic Agent - OD - Right Eye       Time Out 10/23/2023. 8:56 AM. Confirmed correct patient, procedure, site, and patient consented.   Anesthesia Topical anesthesia was used. Anesthetic medications included Lidocaine 2%, Proparacaine 0.5%.   Procedure Preparation included 5% betadine to ocular surface, eyelid speculum. A supplied (32g) needle was used.   Injection: 6  mg faricimab -svoa 6 MG/0.05ML   Route: Intravitreal, Site: Right Eye   NDC: 16109-604-54, Lot: [id number], Expiration date: 10/24/2024, Waste: 0 mL   Post-op Post injection exam found visual acuity of at least counting fingers. The patient tolerated the procedure well. There were no complications. The patient received written and verbal post procedure care education. Post injection medications were not given.            ASSESSMENT/PLAN:   ICD-10-CM   1. Branch retinal vein occlusion of right eye with macular edema  H34.8310 OCT, Retina - OU - Both Eyes    Intravitreal Injection, Pharmacologic Agent - OD - Right Eye    faricimab -svoa (VABYSMO ) 6mg /0.30mL intravitreal injection    2. Retinal artery branch occlusion of right eye  H34.231     3. Moderate nonproliferative diabetic retinopathy of both eyes without macular edema associated with type 2 diabetes mellitus (HCC)  N82.9562     4. Long term (current) use of oral hypoglycemic drugs  Z79.84     5. Current use of insulin (HCC)  Z79.4     6. Essential hypertension  I10     7. Hypertensive retinopathy of both eyes  H35.033     8. Combined forms of age-related cataract of left eye  H25.812     9. 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 BRVO with CME OD  - delayed f/u -- 7 wks instead of 5 on 01.23.25  - lost to follow up to 16 wks instead of 9 (08.10.23-11.30.23)  - lost to follow up from 6 weeks to 8 weeks 912.28.23 - 02.22.24)  - s/p IVA OD #1 (10.05.20), #2 (11.02.20), #3 (11.30.20), #4  (01.07.21), #5 (02.05.21), #6 (03.05.21), #7 (04.02.21), #8 (05.04.21), #9 (06.03.21), #10 (07.22.21), #11 (08.19.21), #12 (09.16.21) -- IVA resistance  - s/p IVE OD #1 (10.14.21), #2 (11.11.21), #3 (12.09.21), #4 (01.13.22), #5 (2.10.22), #6 (04.05.22), #7 (05.26.22), #8 (07.21.22), #9 (09.29.22), #10 (12.01.22), #11 (02.02.23), #12 (04.06.23), #13 (06.08.23), #14 (08.10.23), #15 (11.30.23), #16 (12.28.23), #17 (02.22.24), #18 (04.02.24), #19 (05.16.24), #20 (06.27.24), #21 (08.13.24), #22 (09.19.24) -- IVE resistance  - s/p IVV OD #1 (10.29.24), #2 (12.05.24), #3 (01.23.25), #4 (02.27.25), #5 (04.10.25)  **history of increased peripapillary IRF at 5 wks, noted on 09.19.24 visit**  **history of increased IRF at 7 weeks, noted on 05.29.25 exam**  **history of increased IRF at 10 wk interval, noted on 9.29.22 visit** **history of increased IRF at 16 wks on 11.30.23 visit**  - initial exam and OCT findings suggestive of a BRAO component contributing  - BCVA OD 20/20   - exam shows stable improvement in macular edema superior macula  - OCT shows Stable improvement in central IRF and SRF, persistent focal IRF/IRHM superior macula -- slightly increased, partial PVD, focal schisis cavity IT periphery caught on widefield at 7 wks  - recommend IVV OD #6 today, 05.29.25 for BRVO w/ CME with f/u back to 6 weeks  - pt wishes to proceed  - RBA of procedure discussed, questions answered  - Vabysmo  informed consent obtained and signed, 10.29.24  - see procedure note  - Eylea4U benefits investigation started 9.16.21 -- approved for 2025  - Vabysmo  is approved for 2025  - F/U 6 weeks -- DFE/OCT/possible injection, widefield OCT through schisis  3-5. Moderate nonproliferative diabetic retinopathy OU  - A1c 6.7 on 05.14.25 - The incidence, risk factors for progression, natural history and treatment options for diabetic  retinopathy were discussed with patient.   - The need for close monitoring of blood glucose,  blood pressure, and serum lipids, avoiding cigarette or any type of tobacco, and the need for long term follow up was also discussed with patient. - exam shows scattered MA and punctate exudates OU - recommend IVV OD #6 as above - discussed possible DR component contributing to edema OD and possible benefit of from Vabysmo  to IVE HD OD - will check IVE HD auth - f/u 6 wks -- DFE/OCT, possible injxn  6,7. Hypertensive retinopathy OU  - pt not formally diagnosed with HTN  - discussed importance of tight BP control and its relation to problems #1-2 above  - advised discussion with PCP and possible calibration of home BP cuff with PCP's  - monitor  8. Mixed form age related cataract OS  - The symptoms of cataract, surgical options, and treatments and risks were discussed with patient.  - discussed diagnosis and progression  - under the expert management of Dr. Carloyn Chi  9. Pseudophakia OD  - s/p CE/IOL (Dr. Carloyn Chi, 10.18.23)  - IOL in good position  - post - op CME + BRVO w/ CME   - monitor  10. Retinoschisis OU  - shallow schisis inferotemporal periphery OU -- stable  - stable on widefield OCT  - no RT/RD on scleral depression  - pt asymptomatic  - monitor  Ophthalmic Meds Ordered this visit:  Meds ordered this encounter  Medications   faricimab -svoa (VABYSMO ) 6mg /0.90mL intravitreal injection     Return in about 6 weeks (around 12/04/2023) for f/u BRVO OD, DFE, OCT, Possible Injxn.  There are no Patient Instructions on file for this visit.   Explained the diagnoses, plan, and follow up with the patient and they expressed understanding.  Patient expressed understanding of the importance of proper follow up care.   This document serves as a record of services personally performed by Jeanice Millard, MD, PhD. It was created on their behalf by Diona Franklin, COMT. The creation of this record is the provider's dictation and/or activities during the visit.  Electronically signed by: Diona Franklin, COMT 10/24/23 9:34 AM  This document serves as a record of services personally performed by Jeanice Millard, MD, PhD. It was created on their behalf by Morley Arabia. Bevin Bucks, OA an ophthalmic technician. The creation of this record is the provider's dictation and/or activities during the visit.    Electronically signed by: Morley Arabia. Bevin Bucks, OA 10/24/23 9:34 AM  Jeanice Millard, M.D., Ph.D. Diseases & Surgery of the Retina and Vitreous [facility]  I have reviewed the above documentation for accuracy and completeness, and I agree with the above. Jeanice Millard, M.D., Ph.D. 10/23/23 9:34 AM   Abbreviations: M myopia (nearsighted); A astigmatism; H hyperopia (farsighted); P presbyopia; Mrx spectacle prescription;  CTL contact lenses; OD right eye; OS left eye; OU both eyes  XT exotropia; ET esotropia; PEK punctate epithelial keratitis; PEE punctate epithelial erosions; DES dry eye syndrome; MGD meibomian gland dysfunction; ATs artificial tears; PFAT's preservative free artificial tears; NSC nuclear sclerotic cataract; PSC posterior subcapsular cataract; ERM epi-retinal membrane; PVD posterior vitreous detachment; RD retinal detachment; DM diabetes mellitus; DR diabetic retinopathy; NPDR non-proliferative diabetic retinopathy; PDR proliferative diabetic retinopathy; CSME clinically significant macular edema; DME diabetic macular edema; dbh dot blot hemorrhages; CWS cotton wool spot; POAG primary open angle glaucoma; C/D cup-to-disc ratio; HVF humphrey visual field; GVF goldmann visual field; OCT optical coherence  tomography; IOP intraocular pressure; BRVO Branch retinal vein occlusion; CRVO central retinal vein occlusion; CRAO central retinal artery occlusion; BRAO branch retinal artery occlusion; RT retinal tear; SB scleral buckle; PPV pars plana vitrectomy; VH Vitreous hemorrhage; PRP panretinal laser photocoagulation; IVK intravitreal kenalog; VMT vitreomacular traction; MH  Macular hole;  NVD neovascularization of the disc; NVE neovascularization elsewhere; AREDS age related eye disease study; ARMD age related macular degeneration; POAG primary open angle glaucoma; EBMD epithelial/anterior basement membrane dystrophy; ACIOL anterior chamber intraocular lens; IOL intraocular lens; PCIOL posterior chamber intraocular lens; Phaco/IOL phacoemulsification with intraocular lens placement; PRK photorefractive keratectomy; LASIK laser assisted in situ keratomileusis; HTN hypertension; DM diabetes mellitus; COPD chronic obstructive pulmonary disease

## 2023-11-25 NOTE — Progress Notes (Signed)
 [facility] - Clinic Note  12/04/2023     CHIEF COMPLAINT Patient presents for Retina Follow Up   HISTORY OF PRESENT ILLNESS: Jacqueline Greer is a 63 y.o. female who presents to the clinic today for:   HPI     Retina Follow Up   Patient presents with  CRVO/BRVO.  In right eye.  This started 5 years ago.  Duration of 6 weeks.  Since onset it is stable.  I, the attending physician,  performed the HPI with the patient and updated documentation appropriately.        Comments   Pt states no changes in vision. Pt denies FOL/floaters/pain. Pt does not use ats. A1c=6.4 few months ago BS=115 this morning      Last edited by Valdemar Rogue, MD on 12/04/2023  2:06 PM.     Pt states   Referring physician: Asuncion Setter, PA-C [address] [place] ,  [place] [address]  HISTORICAL INFORMATION:   Selected notes from the MEDICAL RECORD NUMBER Referred by Dr. Demarco for concern of BRVO   CURRENT MEDICATIONS: Current Outpatient Medications (Ophthalmic Drugs)  Medication Sig   prednisoLONE  acetate (PRED FORTE ) 1 % ophthalmic suspension Place 1 drop into both eyes 4 (four) times daily.   No current facility-administered medications for this visit. (Ophthalmic Drugs)   Current Outpatient Medications (Other)  Medication Sig   metformin (FORTAMET) 500 MG (OSM) 24 hr tablet Take 500 tablets by mouth 1 day or 1 dose. One table before dinner.   MOUNJARO 2.5 MG/0.5ML Pen SMARTSIG:2.5 Milligram(s) SUB-Q Once a Week   omeprazole (PRILOSEC) 20 MG capsule Take 20 mg by mouth daily.   TOUJEO SOLOSTAR 300 UNIT/ML Solostar Pen INJECT 6 UNITS ONCE DAILY SUBCUTANEOUSLY (OVER 90 DAYS)   insulin degludec (TRESIBA FLEXTOUCH) 100 UNIT/ML FlexTouch Pen Inject 6 Units into the skin daily. (Patient not taking: Reported on 12/04/2023)   No current facility-administered medications for this visit. (Other)   REVIEW OF SYSTEMS: ROS   Positive for: Endocrine, Eyes Negative  for: Constitutional, Gastrointestinal, Neurological, Skin, Genitourinary, Musculoskeletal, HENT, Cardiovascular, Respiratory, Psychiatric, Allergic/Imm, Heme/Lymph Last edited by Elnor Avelina RAMAN, COT on 12/04/2023  8:17 AM.      ALLERGIES No Known Allergies  PAST MEDICAL HISTORY Past Medical History:  Diagnosis Date   Cataract    OU   Diabetes mellitus without complication (HCC)    Hypertensive retinopathy    OU   Past Surgical History:  Procedure Laterality Date   LAPAROSCOPIC TUBAL LIGATION  2003   FAMILY HISTORY Family History  Problem Relation Age of Onset   Diabetes Mother    Kidney failure Father    SOCIAL HISTORY Social History   Tobacco Use   Smoking status: Never   Smokeless tobacco: Never  Vaping Use   Vaping status: Never Used  Substance Use Topics   Alcohol use: No   Drug use: No       OPHTHALMIC EXAM:  Base Eye Exam     Visual Acuity (Snellen - Linear)       Right Left   Dist cc 20/30 -2 20/20   Dist ph cc 20/20          Tonometry (Tonopen, 8:11 AM)       Right Left   Pressure 16 20         Pupils       Pupils Dark Light Shape React APD   Right PERRL 3 2 Round Brisk None  Left PERRL 3 2 Round Brisk None         Visual Fields       Left Right    Full Full         Extraocular Movement       Right Left    Full, Ortho Full, Ortho         Neuro/Psych     Oriented x3: Yes   Mood/Affect: Normal         Dilation     Both eyes: 1.0% Mydriacyl, 2.5% Phenylephrine @ 8:11 AM           Slit Lamp and Fundus Exam     Slit Lamp Exam       Right Left   Lids/Lashes Dermatochalasis - upper lid Normal   Conjunctiva/Sclera Nasal and temporal Pinguecula, Melanosis Nasal and Pinguecula, Melanosis   Cornea well healed cataract wound, tear film debris, 2+ Punctate epithelial erosions, trace fine KP trace Punctate epithelial erosions, trace tear film debris, trace fine KP   Anterior Chamber deep and clear, no cell or  flare deep and clear, no cell or flare   Iris Round and dilated, No NVI Round and reactive   Lens PC IOL in good position, trace Posterior capsular opacification 2-3+ Nuclear sclerosis, 2-3+ Cortical cataract   Anterior Vitreous mild Vitreous syneresis, Posterior vitreous detachment mild Vitreous syneresis         Fundus Exam       Right Left   Disc 2+Pallor, +PPP Pink and Sharp, temporal PPP, no heme   C/D Ratio 0.5 0.5   Macula good foveal reflex, interval improvement in central edema, focal edema with punctate exudates SN and temporal macula Flat, Good foveal reflex, RPE mottling and clumping, No heme or edema, geographic area of hypopigmented RPE nasal and superior to fovea -- stable from prior, focal shallow SRF nasal macula (peripapillary)   Vessels attenuated, Tortuous, severe attenuation ST arcades attenuated, Tortuous   Periphery Attached, DBH temporal periphery -- extension of BRVO, bullous schisis cavity IT periphery (from 0700-0900) - stable; focal pigmented CR scar at 0130 midzone, no RT/RD, WWOP temporally, pigmented cystoid degeneration inferiorly, scattered MA and punctate exudates Attached, no heme, WWP temporal and inferior quadrants, shallow schisis IT periphery           Refraction     Wearing Rx       Sphere Cylinder Axis Add   Right -0.50 +1.00 152 +2.25   Left -4.50 +0.75 019 +2.25    Age: 66-4 years   Type: Progressive           IMAGING AND PROCEDURES  Imaging and Procedures for @TODAY @  OCT, Retina - OU - Both Eyes       Right Eye Quality was good. Central Foveal Thickness: 236. Progression has worsened. Findings include normal foveal contour, no SRF, intraretinal hyper-reflective material, intraretinal fluid (Stable improvement in central IRF and SRF, interval improvement in persistent focal IRF/IRHM superior macula, partial PVD, focal schisis cavity IT periphery caught on widefield).   Left Eye Quality was good. Central Foveal Thickness: 251.  Progression has worsened. Findings include normal foveal contour, no IRF, subretinal fluid (Interval development of shallow peripapillary SRF, trace focal cystic changes nasal fovea, irregular RPE contour, partial PVD, Shallow IT Retinoschisis -- not imaged today).   Notes *Images captured and stored on drive  Diagnosis / Impression:  OD: Stable improvement in central IRF and SRF, interval improvement in persistent focal IRF/IRHM superior macula, partial PVD,  focal schisis cavity IT periphery caught on widefield OS: Interval development of shallow peripapillary SRF, trace focal cystic changes nasal fovea, irregular RPE contour, partial PVD, Shallow IT Retinoschisis -- not imaged today  Clinical management:  See below  Abbreviations: NFP - Normal foveal profile. CME - cystoid macular edema. PED - pigment epithelial detachment. IRF - intraretinal fluid. SRF - subretinal fluid. EZ - ellipsoid zone. ERM - epiretinal membrane. ORA - outer retinal atrophy. ORT - outer retinal tubulation. SRHM - subretinal hyper-reflective material      Intravitreal Injection, Pharmacologic Agent - OD - Right Eye       Time Out 12/04/2023. 8:48 AM. Confirmed correct patient, procedure, site, and patient consented.   Anesthesia Topical anesthesia was used. Anesthetic medications included Lidocaine 2%, Proparacaine 0.5%.   Procedure Preparation included 5% betadine to ocular surface, eyelid speculum. A supplied (32g) needle was used.   Injection: 6 mg faricimab -svoa 6 MG/0.05ML   Route: Intravitreal, Site: Right Eye   NDC: 49757-903-93, Lot: [id number], Expiration date: 12/24/2024, Waste: 0 mL   Post-op Post injection exam found visual acuity of at least counting fingers. The patient tolerated the procedure well. There were no complications. The patient received written and verbal post procedure care education. Post injection medications were not given.            ASSESSMENT/PLAN:   ICD-10-CM   1.  Branch retinal vein occlusion of right eye with macular edema  H34.8310 OCT, Retina - OU - Both Eyes    Intravitreal Injection, Pharmacologic Agent - OD - Right Eye    faricimab -svoa (VABYSMO ) 6mg /0.9mL intravitreal injection    2. Retinal artery branch occlusion of right eye  H34.231     3. 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 Long term (current) use of oral hypoglycemic drugs  Z79.84     5. Current use of insulin (HCC)  Z79.4     6. Essential hypertension  I10     7. Hypertensive retinopathy of both eyes  H35.033     8. Combined forms of age-related cataract of left eye  H25.812     9. 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 BRVO with CME OD  - delayed f/u -- 7 wks instead of 5 on 01.23.25  - lost to follow up to 16 wks instead of 9 (08.10.23-11.30.23)  - lost to follow up from 6 weeks to 8 weeks 912.28.23 - 02.22.24)  - s/p IVA OD #1 (10.05.20), #2 (11.02.20), #3 (11.30.20), #4 (01.07.21), #5 (02.05.21), #6 (03.05.21), #7 (04.02.21), #8 (05.04.21), #9 (06.03.21), #10 (07.22.21), #11 (08.19.21), #12 (09.16.21) -- IVA resistance  - s/p IVE OD #1 (10.14.21), #2 (11.11.21), #3 (12.09.21), #4 (01.13.22), #5 (2.10.22), #6 (04.05.22), #7 (05.26.22), #8 (07.21.22), #9 (09.29.22), #10 (12.01.22), #11 (02.02.23), #12 (04.06.23), #13 (06.08.23), #14 (08.10.23), #15 (11.30.23), #16 (12.28.23), #17 (02.22.24), #18 (04.02.24), #19 (05.16.24), #20 (06.27.24), #21 (08.13.24), #22 (09.19.24) -- IVE resistance  - s/p IVV OD #1 (10.29.24), #2 (12.05.24), #3 (01.23.25), #4 (02.27.25), #5 (04.10.25), #6 (05.29.25)  **history of increased peripapillary IRF at 5 wks, noted on 09.19.24 visit**  **history of increased IRF at 7 weeks, noted on 05.29.25 exam**  **history of increased IRF at 10 wk interval, noted on 9.29.22  visit** **history of increased IRF at 16 wks  on 11.30.23 visit**  - initial exam and OCT findings suggestive of a BRAO component contributing  - BCVA OD 20/20 - stable  - exam shows interval improvement in macular edema superior macula  - OCT shows OD: Stable improvement in central IRF and SRF, interval improvement in persistent focal IRF/IRHM superior macula, partial PVD, focal schisis cavity IT periphery caught on widefield at 6 wks  - recommend IVV OD #7 today, 07.10.25 for BRVO w/ CME with f/u at 6 weeks again  - pt wishes to proceed  - RBA of procedure discussed, questions answered  - Vabysmo  informed consent obtained and signed, 10.29.24  - see procedure note  - [employer] benefits investigation started 9.16.21 -- approved for 2025  - Vabysmo  is approved for 2025  - F/U 6 weeks -- DFE/OCT/possible injection, widefield OCT through schisis  3-5. Moderate nonproliferative diabetic retinopathy OU  - A1c 6.7 on 05.14.25 - The incidence, risk factors for progression, natural history and treatment options for diabetic retinopathy were discussed with patient.   - The need for close monitoring of blood glucose, blood pressure, and serum lipids, avoiding cigarette or any type of tobacco, and the need for long term follow up was also discussed with patient. - exam shows scattered MA and punctate exudates OU - recommend IVV OD #7 as above - discussed possible DR component contributing to edema OD and possible benefit of from Vabysmo  to IVE HD OD - approved for IVE HD, but will owe 20% due to no Good Days - f/u 6 wks -- DFE/OCT, possible injxn  6,7. Hypertensive retinopathy OU  - pt not formally diagnosed with HTN  - discussed importance of tight BP control and its relation to problems #1-2 above  - advised discussion with PCP and possible calibration of home BP cuff with PCP's  - monitor  8. Mixed form age related cataract OS  - The symptoms of cataract, surgical options, and treatments and  risks were discussed with patient.  - discussed diagnosis and progression  - under the expert management of Dr. Fleeta  9. Pseudophakia OD  - s/p CE/IOL (Dr. Fleeta, 10.18.23)  - IOL in good position  - post - op CME + BRVO w/ CME   - monitor  10. 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    Dispense:  15 mL    Refill:  0   prednisoLONE  acetate (PRED FORTE ) 1 % ophthalmic suspension    Sig: Place 1 drop into both eyes 4 (four) times daily.    Dispense:  15 mL    Refill:  0   faricimab -svoa (VABYSMO ) 6mg /0.44mL intravitreal injection     Return in about 6 weeks (around 01/15/2024) for f/u BRVO OD, DFE, OCT, Possible Injxn.  There are no Patient Instructions on file for this visit.   Explained the diagnoses, plan, and follow up with the patient and they expressed understanding.  Patient expressed understanding of the importance of proper follow up care.   This  document serves as a record of services personally performed by Redell JUDITHANN Hans, MD, PhD. It was created on their behalf by Alan PARAS. Delores, OA an ophthalmic technician. The creation of this record is the provider's dictation and/or activities during the visit.    Electronically signed by: Alan PARAS. Delores, OA 12/04/23 10:48 PM  Redell JUDITHANN Hans, M.D., Ph.D. Diseases & Surgery of the Retina and Vitreous [facility]  I have reviewed the above documentation for accuracy and completeness, and I agree with the above. Redell JUDITHANN Hans, M.D., Ph.D. 12/04/23 10:48 PM    Abbreviations: M myopia (nearsighted); A astigmatism; H hyperopia (farsighted); P presbyopia; Mrx spectacle prescription;  CTL contact lenses; OD right eye; OS left eye; OU both eyes  XT exotropia; ET esotropia; PEK punctate epithelial keratitis; PEE punctate epithelial erosions; DES dry eye syndrome; MGD meibomian gland dysfunction; ATs artificial tears; PFAT's preservative free artificial tears; NSC nuclear sclerotic cataract; PSC posterior subcapsular cataract; ERM epi-retinal membrane; PVD posterior vitreous detachment; RD retinal detachment; DM diabetes mellitus; DR diabetic retinopathy; NPDR non-proliferative diabetic retinopathy; PDR proliferative diabetic retinopathy; CSME clinically significant macular edema; DME diabetic macular edema; dbh dot blot hemorrhages; CWS cotton wool spot; POAG primary open angle glaucoma; C/D cup-to-disc ratio; HVF humphrey visual field; GVF goldmann visual field; OCT optical coherence tomography; IOP intraocular pressure; BRVO Branch retinal vein occlusion; CRVO central retinal vein occlusion; CRAO central retinal artery occlusion; BRAO branch retinal artery occlusion; RT retinal tear; SB scleral buckle; PPV pars plana vitrectomy; VH Vitreous hemorrhage; PRP panretinal laser photocoagulation; IVK intravitreal kenalog; VMT vitreomacular traction; MH Macular hole;  NVD neovascularization of the disc; NVE neovascularization elsewhere; AREDS age related eye disease study; ARMD age related macular degeneration; POAG primary open angle glaucoma; EBMD epithelial/anterior basement membrane dystrophy; ACIOL anterior chamber intraocular lens; IOL intraocular lens; PCIOL posterior chamber intraocular lens; Phaco/IOL phacoemulsification with intraocular lens placement; PRK photorefractive keratectomy; LASIK laser assisted in situ keratomileusis; HTN hypertension; DM diabetes mellitus; COPD chronic obstructive pulmonary disease

## 2023-12-04 ENCOUNTER — Ambulatory Visit (INDEPENDENT_AMBULATORY_CARE_PROVIDER_SITE_OTHER): Admitting: Ophthalmology

## 2023-12-04 ENCOUNTER — Encounter (INDEPENDENT_AMBULATORY_CARE_PROVIDER_SITE_OTHER): Payer: Self-pay | Admitting: Ophthalmology

## 2023-12-04 DIAGNOSIS — H33103 Unspecified retinoschisis, bilateral: Secondary | ICD-10-CM

## 2023-12-04 DIAGNOSIS — Z7984 Long term (current) use of oral hypoglycemic drugs: Secondary | ICD-10-CM | POA: Diagnosis not present

## 2023-12-04 DIAGNOSIS — E113393 Type 2 diabetes mellitus with moderate nonproliferative diabetic retinopathy without macular edema, bilateral: Secondary | ICD-10-CM | POA: Diagnosis not present

## 2023-12-04 DIAGNOSIS — I10 Essential (primary) hypertension: Secondary | ICD-10-CM

## 2023-12-04 DIAGNOSIS — H34231 Retinal artery branch occlusion, right eye: Secondary | ICD-10-CM

## 2023-12-04 DIAGNOSIS — H34831 Tributary (branch) retinal vein occlusion, right eye, with macular edema: Secondary | ICD-10-CM | POA: Diagnosis not present

## 2023-12-04 DIAGNOSIS — Z961 Presence of intraocular lens: Secondary | ICD-10-CM

## 2023-12-04 DIAGNOSIS — H35033 Hypertensive retinopathy, bilateral: Secondary | ICD-10-CM

## 2023-12-04 DIAGNOSIS — Z794 Long term (current) use of insulin: Secondary | ICD-10-CM

## 2023-12-04 DIAGNOSIS — H25812 Combined forms of age-related cataract, left eye: Secondary | ICD-10-CM

## 2023-12-04 DIAGNOSIS — H209 Unspecified iridocyclitis: Secondary | ICD-10-CM

## 2023-12-04 MED ORDER — FARICIMAB-SVOA 6 MG/0.05ML IZ SOSY
6.0000 mg | PREFILLED_SYRINGE | INTRAVITREAL | Status: AC | PRN
  Administered 2023-12-04: 6 mg via INTRAVITREAL

## 2023-12-04 MED ORDER — PREDNISOLONE ACETATE 1 % OP SUSP: 1.0000 [drp] | Freq: Four times a day (QID) | OPHTHALMIC | 0 refills | Status: DC

## 2023-12-04 MED ORDER — PREDNISOLONE ACETATE 1 % OP SUSP: 1.0000 [drp] | Freq: Four times a day (QID) | OPHTHALMIC | 0 refills | Status: AC

## 2024-01-01 NOTE — Progress Notes (Signed)
 [facility] - Clinic Note  01/15/2024     CHIEF COMPLAINT Patient presents for Retina Follow Up   HISTORY OF PRESENT ILLNESS: Jacqueline Greer is a 63 y.o. female who presents to the clinic today for:   HPI     Retina Follow Up   Patient presents with  CRVO/BRVO.  In right eye.  This started 6 weeks ago.  Duration of 6 weeks.  Since onset it is stable.        Comments   6 week retina follow up BRVO OD and IVV OD pt is reporting no vision changes noticed she denies any flashes or floaters her last reading was 127e      Last edited by Resa Delon ORN, COT on 01/15/2024  8:04 AM.      Pt states VA is doing well, no complaints.   Referring physician: Asuncion Setter, PA-C [address] [place] ,  [place] [address]  HISTORICAL INFORMATION:   Selected notes from the MEDICAL RECORD NUMBER Referred by Dr. Demarco for concern of BRVO   CURRENT MEDICATIONS: Current Outpatient Medications (Ophthalmic Drugs)  Medication Sig   prednisoLONE  acetate (PRED FORTE ) 1 % ophthalmic suspension Place 1 drop into both eyes 4 (four) times daily.   No current facility-administered medications for this visit. (Ophthalmic Drugs)   Current Outpatient Medications (Other)  Medication Sig   insulin degludec (TRESIBA FLEXTOUCH) 100 UNIT/ML FlexTouch Pen Inject 6 Units into the skin daily.   metformin (FORTAMET) 500 MG (OSM) 24 hr tablet Take 500 tablets by mouth 1 day or 1 dose. One table before dinner.   MOUNJARO 2.5 MG/0.5ML Pen SMARTSIG:2.5 Milligram(s) SUB-Q Once a Week   omeprazole (PRILOSEC) 20 MG capsule Take 20 mg by mouth daily.   TOUJEO SOLOSTAR 300 UNIT/ML Solostar Pen INJECT 6 UNITS ONCE DAILY SUBCUTANEOUSLY (OVER 90 DAYS)   No current facility-administered medications for this visit. (Other)   REVIEW OF SYSTEMS: ROS   Positive for: Endocrine, Eyes Negative for: Constitutional, Gastrointestinal, Neurological, Skin, Genitourinary,  Musculoskeletal, HENT, Cardiovascular, Respiratory, Psychiatric, Allergic/Imm, Heme/Lymph Last edited by Resa Delon ORN, COT on 01/15/2024  8:04 AM.       ALLERGIES No Known Allergies  PAST MEDICAL HISTORY Past Medical History:  Diagnosis Date   Cataract    OU   Diabetes mellitus without complication (HCC)    Hypertensive retinopathy    OU   Past Surgical History:  Procedure Laterality Date   LAPAROSCOPIC TUBAL LIGATION  2003   FAMILY HISTORY Family History  Problem Relation Age of Onset   Diabetes Mother    Kidney failure Father    SOCIAL HISTORY Social History   Tobacco Use   Smoking status: Never   Smokeless tobacco: Never  Vaping Use   Vaping status: Never Used  Substance Use Topics   Alcohol use: No   Drug use: No       OPHTHALMIC EXAM:  Base Eye Exam     Visual Acuity (Snellen - Linear)       Right Left   Dist cc 20/25 20/25   Dist ph cc  20/20         Tonometry (Tonopen, 8:08 AM)       Right Left   Pressure 18 17         Pupils       Pupils Dark Light Shape React APD   Right PERRL 3 2 Round Brisk None   Left PERRL 3 2 Round Brisk None  Visual Fields       Left Right    Full Full         Extraocular Movement       Right Left    Full, Ortho Full, Ortho         Neuro/Psych     Oriented x3: Yes   Mood/Affect: Normal         Dilation     Both eyes: 2.5% Phenylephrine @ 8:08 AM           Slit Lamp and Fundus Exam     Slit Lamp Exam       Right Left   Lids/Lashes Dermatochalasis - upper lid Normal   Conjunctiva/Sclera Nasal and temporal Pinguecula, Melanosis Nasal and Pinguecula, Melanosis   Cornea well healed cataract wound, tear film debris, 2+ Punctate epithelial erosions, trace fine KP trace Punctate epithelial erosions, trace tear film debris, trace fine KP   Anterior Chamber deep and clear, no cell or flare deep and clear, no cell or flare   Iris Round and dilated, No NVI Round and  reactive   Lens PC IOL in good position, trace Posterior capsular opacification 2-3+ Nuclear sclerosis, 2-3+ Cortical cataract   Anterior Vitreous mild Vitreous syneresis, Posterior vitreous detachment mild Vitreous syneresis         Fundus Exam       Right Left   Disc 2+Pallor, +PPP Pink and Sharp, temporal PPP, no heme   C/D Ratio 0.5 0.5   Macula good foveal reflex, interval improvement in central edema, focal edema with punctate exudates SN and temporal macula Flat, Good foveal reflex, RPE mottling and clumping, No heme or edema, geographic area of hypopigmented RPE nasal and superior to fovea -- stable from prior, focal shallow SRF nasal macula (peripapillary)   Vessels attenuated, Tortuous, severe attenuation ST arcades attenuated, Tortuous   Periphery Attached, DBH temporal periphery -- extension of BRVO, bullous schisis cavity IT periphery (from 0700-0900) - stable; focal pigmented CR scar at 0130 midzone, no RT/RD, WWOP temporally, pigmented cystoid degeneration inferiorly, scattered MA and punctate exudates Attached, no heme, WWP temporal and inferior quadrants, shallow schisis IT periphery           Refraction     Wearing Rx       Sphere Cylinder Axis Add   Right -0.50 +1.00 152 +2.25   Left -4.50 +0.75 019 +2.25    Type: Progressive           IMAGING AND PROCEDURES  Imaging and Procedures for @TODAY @  OCT, Retina - OU - Both Eyes       Right Eye Quality was good. Central Foveal Thickness: 236. Progression has worsened. Findings include normal foveal contour, no SRF, intraretinal hyper-reflective material, intraretinal fluid (Stable improvement in central IRF and SRF, interval improvement in persistent focal IRF/IRHM superior macula, partial PVD, focal schisis cavity IT periphery caught on widefield).   Left Eye Quality was good. Central Foveal Thickness: 251. Progression has worsened. Findings include normal foveal contour, no IRF, subretinal fluid (Interval  development of shallow peripapillary SRF, trace focal cystic changes nasal fovea, irregular RPE contour, partial PVD, Shallow IT Retinoschisis -- not imaged today).   Notes *Images captured and stored on drive  Diagnosis / Impression:  OD: Stable improvement in central IRF and SRF, interval improvement in persistent focal IRF/IRHM superior macula, partial PVD, focal schisis cavity IT periphery caught on widefield OS: Interval development of shallow peripapillary SRF, trace focal cystic changes nasal fovea, irregular  RPE contour, partial PVD, Shallow IT Retinoschisis -- not imaged today  Clinical management:  See below  Abbreviations: NFP - Normal foveal profile. CME - cystoid macular edema. PED - pigment epithelial detachment. IRF - intraretinal fluid. SRF - subretinal fluid. EZ - ellipsoid zone. ERM - epiretinal membrane. ORA - outer retinal atrophy. ORT - outer retinal tubulation. SRHM - subretinal hyper-reflective material             ASSESSMENT/PLAN:   ICD-10-CM   1. Branch retinal vein occlusion of right eye with macular edema  H34.8310 OCT, Retina - OU - Both Eyes    2. Retinal artery branch occlusion of right eye  H34.231     3. 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 Long term (current) use of oral hypoglycemic drugs  Z79.84     5. Current use of insulin (HCC)  Z79.4     6. Essential hypertension  I10     7. Hypertensive retinopathy of both eyes  H35.033     8. Combined forms of age-related cataract of left eye  H25.812     9. 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 BRVO with CME OD  - delayed f/u -- 7 wks instead of 5 on 01.23.25  - lost to follow up to 16 wks instead of 9 (08.10.23-11.30.23)  - lost to follow up from 6 weeks to 8 weeks 912.28.23 - 02.22.24)  - s/p IVA OD #1 (10.05.20), #2  (11.02.20), #3 (11.30.20), #4 (01.07.21), #5 (02.05.21), #6 (03.05.21), #7 (04.02.21), #8 (05.04.21), #9 (06.03.21), #10 (07.22.21), #11 (08.19.21), #12 (09.16.21) -- IVA resistance  - s/p IVE OD #1 (10.14.21), #2 (11.11.21), #3 (12.09.21), #4 (01.13.22), #5 (2.10.22), #6 (04.05.22), #7 (05.26.22), #8 (07.21.22), #9 (09.29.22), #10 (12.01.22), #11 (02.02.23), #12 (04.06.23), #13 (06.08.23), #14 (08.10.23), #15 (11.30.23), #16 (12.28.23), #17 (02.22.24), #18 (04.02.24), #19 (05.16.24), #20 (06.27.24), #21 (08.13.24), #22 (09.19.24) -- IVE resistance  - s/p IVV OD #1 (10.29.24), #2 (12.05.24), #3 (01.23.25), #4 (02.27.25), #5 (04.10.25), #6 (05.29.25), #7 (07.10.25)  **history of increased peripapillary IRF at 5 wks, noted on 09.19.24 visit**  **history of increased IRF at 7 weeks, noted on 05.29.25 exam**  **history of increased IRF at 10 wk interval, noted on 9.29.22 visit** **history of increased IRF at 16 wks on 11.30.23 visit**  - initial exam and OCT findings suggestive of a BRAO component contributing  - BCVA OD 20/25 decreased from 20/20  - exam shows interval improvement in macular edema superior macula  - OCT shows OD: Stable improvement in persistent focal IRF/IRHM superior macula, persistent IRF/IRHM SN macula (peripapillary--slightly improved), partial PVD, focal schisis cavity IT periphery --not imaged today at 6 wks  - recommend IVV today OD #8 (08.21.25) for BRVO w/ CME with f/u at 6 weeks again  - pt wishes to proceed  - RBA of procedure discussed, questions answered  - Vabysmo  informed consent obtained and signed, 10.29.24  - see procedure note  - [employer]yle[employer] benefits investigation started 9.16.21 -- approved for 2025  - Vabysmo  is approved for 2025  - F/U 6 weeks -- DFE/OCT/possible injection, widefield OCT through schisis  3-5. Moderate nonproliferative diabetic retinopathy OU  - A1c 6.7 on 05.14.25 - The incidence, risk factors for progression, natural history and treatment  options for diabetic  retinopathy were discussed with patient.   - The need for close monitoring of blood glucose, blood pressure, and serum lipids, avoiding cigarette or any type of tobacco, and the need for long term follow up was also discussed with patient. - exam shows scattered MA and punctate exudates OU - recommend IVV OD #8 as above - discussed possible DR component contributing to edema OD and possible benefit of from Vabysmo  to IVE HD OD - approved for IVE HD, but will owe 20% due to no Good Days - f/u 6 wks -- DFE/OCT, possible injection(s)  6,7. Hypertensive retinopathy OU  - pt not formally diagnosed with HTN  - discussed importance of tight BP control and its relation to problems #1-2 above  - advised discussion with PCP and possible calibration of home BP cuff with PCP's  - monitor  8. Mixed form age related cataract OS  - The symptoms of cataract, surgical options, and treatments and risks were discussed with patient.  - discussed diagnosis and progression  - under the expert management of Dr. Fleeta  9. Pseudophakia OD  - s/p CE/IOL (Dr. Fleeta, 10.18.23)  - IOL in good position  - post - op CME + BRVO w/ CME   - monitor  10. 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    No follow-ups on file.  There are no Patient Instructions on file for this visit.   Explained the diagnoses, plan, and follow up with the patient and they expressed understanding.  Patient expressed understanding of the importance of proper follow up care.   This document serves as a record of services personally performed by Redell JUDITHANN Hans, MD, PhD. It was created on  their behalf by Avelina Pereyra, COA an ophthalmic technician. The creation of this record is the provider's dictation and/or activities during the visit.   Electronically signed by: Avelina GORMAN Pereyra, COT  01/15/24  8:41 AM   This document serves as a record of services personally performed by Redell JUDITHANN Hans, MD, PhD. It was created on their behalf by Almetta Pesa, an ophthalmic technician. The creation of this record is the provider's dictation and/or activities during the visit.    Electronically signed by: Almetta Pesa, OA, 01/15/24  8:41 AM   Redell JUDITHANN Hans, M.D., Ph.D. Diseases & Surgery of the Retina and Vitreous [facility]    Abbreviations: M myopia (nearsighted); A astigmatism; H hyperopia (farsighted); P presbyopia; Mrx spectacle prescription;  CTL contact lenses; OD right eye; OS left eye; OU both eyes  XT exotropia; ET esotropia; PEK punctate epithelial keratitis; PEE punctate epithelial erosions; DES dry eye syndrome; MGD meibomian gland dysfunction; ATs artificial tears; PFAT's preservative free artificial tears; NSC nuclear sclerotic cataract; PSC posterior subcapsular cataract; ERM epi-retinal membrane; PVD posterior vitreous detachment; RD retinal detachment; DM diabetes mellitus; DR diabetic retinopathy; NPDR non-proliferative diabetic retinopathy; PDR proliferative diabetic retinopathy; CSME clinically significant macular edema; DME diabetic macular edema; dbh dot blot hemorrhages; CWS cotton wool spot; POAG primary open angle glaucoma; C/D cup-to-disc ratio; HVF humphrey visual field; GVF goldmann visual field; OCT  optical coherence tomography; IOP intraocular pressure; BRVO Branch retinal vein occlusion; CRVO central retinal vein occlusion; CRAO central retinal artery occlusion; BRAO branch retinal artery occlusion; RT retinal tear; SB scleral buckle; PPV pars plana vitrectomy; VH Vitreous hemorrhage; PRP panretinal laser photocoagulation; IVK  intravitreal kenalog; VMT vitreomacular traction; MH Macular hole;  NVD neovascularization of the disc; NVE neovascularization elsewhere; AREDS age related eye disease study; ARMD age related macular degeneration; POAG primary open angle glaucoma; EBMD epithelial/anterior basement membrane dystrophy; ACIOL anterior chamber intraocular lens; IOL intraocular lens; PCIOL posterior chamber intraocular lens; Phaco/IOL phacoemulsification with intraocular lens placement; PRK photorefractive keratectomy; LASIK laser assisted in situ keratomileusis; HTN hypertension; DM diabetes mellitus; COPD chronic obstructive pulmonary disease

## 2024-01-15 ENCOUNTER — Encounter (INDEPENDENT_AMBULATORY_CARE_PROVIDER_SITE_OTHER): Payer: Self-pay | Admitting: Ophthalmology

## 2024-01-15 ENCOUNTER — Ambulatory Visit (INDEPENDENT_AMBULATORY_CARE_PROVIDER_SITE_OTHER): Admitting: Ophthalmology

## 2024-01-15 DIAGNOSIS — Z794 Long term (current) use of insulin: Secondary | ICD-10-CM

## 2024-01-15 DIAGNOSIS — H35033 Hypertensive retinopathy, bilateral: Secondary | ICD-10-CM

## 2024-01-15 DIAGNOSIS — H34831 Tributary (branch) retinal vein occlusion, right eye, with macular edema: Secondary | ICD-10-CM

## 2024-01-15 DIAGNOSIS — H34231 Retinal artery branch occlusion, right eye: Secondary | ICD-10-CM

## 2024-01-15 DIAGNOSIS — E113393 Type 2 diabetes mellitus with moderate nonproliferative diabetic retinopathy without macular edema, bilateral: Secondary | ICD-10-CM

## 2024-01-15 DIAGNOSIS — Z961 Presence of intraocular lens: Secondary | ICD-10-CM

## 2024-01-15 DIAGNOSIS — Z7984 Long term (current) use of oral hypoglycemic drugs: Secondary | ICD-10-CM | POA: Diagnosis not present

## 2024-01-15 DIAGNOSIS — H33103 Unspecified retinoschisis, bilateral: Secondary | ICD-10-CM

## 2024-01-15 DIAGNOSIS — H209 Unspecified iridocyclitis: Secondary | ICD-10-CM

## 2024-01-15 DIAGNOSIS — I10 Essential (primary) hypertension: Secondary | ICD-10-CM

## 2024-01-15 DIAGNOSIS — H25812 Combined forms of age-related cataract, left eye: Secondary | ICD-10-CM

## 2024-01-15 MED ORDER — FARICIMAB-SVOA 6 MG/0.05ML IZ SOSY
6.0000 mg | PREFILLED_SYRINGE | INTRAVITREAL | Status: AC | PRN
  Administered 2024-01-15: 6 mg via INTRAVITREAL

## 2024-02-25 NOTE — Progress Notes (Signed)
 [facility] - Clinic Note  02/26/2024     CHIEF COMPLAINT Patient presents for Retina Follow Up   HISTORY OF PRESENT ILLNESS: Jacqueline Greer is a 63 y.o. female who presents to the clinic today for:   HPI     Retina Follow Up   Patient presents with  CRVO/BRVO.  In right eye.  This started 6 weeks ago.  I, the attending physician,  performed the HPI with the patient and updated documentation appropriately.        Comments   Patient here for 6 weeks retina follow up for BRVO OD. Patient states vision doing pretty good. No eye pain.       Last edited by Valdemar Rogue, MD on 02/26/2024 11:40 AM.    Pt states VA is doing well, feeling well.   Referring physician: Roy Harlene HERO, NP [address] [place],  [place] [address]  HISTORICAL INFORMATION:   Selected notes from the MEDICAL RECORD NUMBER Referred by Dr. Demarco for concern of BRVO   CURRENT MEDICATIONS: Current Outpatient Medications (Ophthalmic Drugs)  Medication Sig   prednisoLONE  acetate (PRED FORTE ) 1 % ophthalmic suspension Place 1 drop into both eyes 4 (four) times daily.   No current facility-administered medications for this visit. (Ophthalmic Drugs)   Current Outpatient Medications (Other)  Medication Sig   insulin degludec (TRESIBA FLEXTOUCH) 100 UNIT/ML FlexTouch Pen Inject 6 Units into the skin daily.   metformin (FORTAMET) 500 MG (OSM) 24 hr tablet Take 500 tablets by mouth 1 day or 1 dose. One table before dinner.   MOUNJARO 2.5 MG/0.5ML Pen SMARTSIG:2.5 Milligram(s) SUB-Q Once a Week   omeprazole (PRILOSEC) 20 MG capsule Take 20 mg by mouth daily.   TOUJEO SOLOSTAR 300 UNIT/ML Solostar Pen INJECT 6 UNITS ONCE DAILY SUBCUTANEOUSLY (OVER 90 DAYS)   No current facility-administered medications for this visit. (Other)   REVIEW OF SYSTEMS: ROS   Positive for: Endocrine, Eyes Negative for: Constitutional, Gastrointestinal, Neurological, Skin, Genitourinary,  Musculoskeletal, HENT, Cardiovascular, Respiratory, Psychiatric, Allergic/Imm, Heme/Lymph Last edited by Orval Asberry RAMAN, COA on 02/26/2024  8:09 AM.        ALLERGIES No Known Allergies  PAST MEDICAL HISTORY Past Medical History:  Diagnosis Date   Cataract    OU   Diabetes mellitus without complication (HCC)    Hypertensive retinopathy    OU   Past Surgical History:  Procedure Laterality Date   LAPAROSCOPIC TUBAL LIGATION  2003   FAMILY HISTORY Family History  Problem Relation Age of Onset   Diabetes Mother    Kidney failure Father    SOCIAL HISTORY Social History   Tobacco Use   Smoking status: Never   Smokeless tobacco: Never  Vaping Use   Vaping status: Never Used  Substance Use Topics   Alcohol use: No   Drug use: No       OPHTHALMIC EXAM:  Base Eye Exam     Visual Acuity (Snellen - Linear)       Right Left   Dist cc 20/20 20/20    Correction: Glasses         Tonometry (Tonopen, 8:07 AM)       Right Left   Pressure 16 17         Pupils       Dark Light Shape React APD   Right 3 2 Round Brisk None   Left 3 2 Round Brisk None  Visual Fields (Counting fingers)       Left Right    Full Full         Extraocular Movement       Right Left    Full, Ortho Full, Ortho         Neuro/Psych     Oriented x3: Yes   Mood/Affect: Normal         Dilation     Both eyes: 1.0% Mydriacyl, 2.5% Phenylephrine @ 8:07 AM           Slit Lamp and Fundus Exam     Slit Lamp Exam       Right Left   Lids/Lashes Dermatochalasis - upper lid Normal   Conjunctiva/Sclera Nasal and temporal Pinguecula, Melanosis Nasal and Pinguecula, Melanosis   Cornea well healed cataract wound, tear film debris, 2+ Punctate epithelial erosions, trace fine KP trace Punctate epithelial erosions, trace tear film debris, trace fine KP   Anterior Chamber deep and clear, no cell or flare deep and clear, no cell or flare   Iris Round and dilated, No  NVI Round and reactive   Lens PC IOL in good position, trace Posterior capsular opacification 2-3+ Nuclear sclerosis, 2-3+ Cortical cataract   Anterior Vitreous mild Vitreous syneresis, Posterior vitreous detachment mild Vitreous syneresis         Fundus Exam       Right Left   Disc 2+Pallor, +PPP Pink and Sharp, temporal PPP, no heme   C/D Ratio 0.5 0.5   Macula good foveal reflex, interval improvement in central edema, focal edema with punctate exudates SN macula--improving, temporal macula stably improved Flat, Good foveal reflex, RPE mottling and clumping, No heme or edema, geographic area of hypopigmented RPE nasal and superior to fovea -- stable from prior, focal shallow SRF nasal macula (peripapillary)--stably improved, no heme   Vessels attenuated, Tortuous, severe attenuation ST arcades attenuated, Tortuous   Periphery Attached, DBH temporal periphery -- extension of BRVO, bullous schisis cavity IT periphery (from 0700-0900) - stable; focal pigmented CR scar at 0130 midzone, no RT/RD, WWOP temporally, pigmented cystoid degeneration inferiorly, scattered MA and punctate exudates Attached, no heme, WWP temporal and inferior quadrants, shallow schisis IT periphery           Refraction     Wearing Rx       Sphere Cylinder Axis Add   Right -0.50 +1.00 152 +2.25   Left -4.50 +0.75 019 +2.25    Type: Progressive           IMAGING AND PROCEDURES  Imaging and Procedures for @TODAY @  OCT, Retina - OU - Both Eyes       Right Eye Quality was good. Central Foveal Thickness: 232. Progression has been stable. Findings include normal foveal contour, no SRF, intraretinal hyper-reflective material, intraretinal fluid (Stable improvement in persistent focal IRF/IRHM superior macula, persistent IRF/IRHM SN macula (peripapillary)--slightly improved, partial PVD, focal schisis cavity IT periphery --not imaged today).   Left Eye Quality was good. Central Foveal Thickness: 261.  Progression has been stable. Findings include normal foveal contour, no IRF, subretinal fluid (Stable improvement in shallow peripapillary SRF, no IRF, partial PVD, Shallow IT Retinoschisis -- not imaged today).   Notes *Images captured and stored on drive  Diagnosis / Impression:  OD: Stable improvement in persistent focal IRF/IRHM superior macula, persistent IRF/IRHM SN macula (peripapillary)--slightly improved, partial PVD, focal schisis cavity IT periphery --not imaged today OS: Stable improvement in shallow peripapillary SRF, no IRF, partial PVD, Shallow  IT Retinoschisis -- not imaged today  Clinical management:  See below  Abbreviations: NFP - Normal foveal profile. CME - cystoid macular edema. PED - pigment epithelial detachment. IRF - intraretinal fluid. SRF - subretinal fluid. EZ - ellipsoid zone. ERM - epiretinal membrane. ORA - outer retinal atrophy. ORT - outer retinal tubulation. SRHM - subretinal hyper-reflective material      Intravitreal Injection, Pharmacologic Agent - OD - Right Eye       Time Out 02/26/2024. 8:38 AM. Confirmed correct patient, procedure, site, and patient consented.   Anesthesia Topical anesthesia was used. Anesthetic medications included Lidocaine 2%, Proparacaine 0.5%.   Procedure Preparation included 5% betadine to ocular surface, eyelid speculum. A supplied (32g) needle was used.   Injection: 6 mg faricimab -svoa 6 MG/0.05ML   Route: Intravitreal, Site: Right Eye   NDC: 49757-903-93, Lot: [id number], Expiration date: 02/23/2025, Waste: 0 mL   Post-op Post injection exam found visual acuity of at least counting fingers. The patient tolerated the procedure well. There were no complications. The patient received written and verbal post procedure care education. Post injection medications were not given.            ASSESSMENT/PLAN:   ICD-10-CM   1. Branch retinal vein occlusion of right eye with macular edema (HCC)  H34.8310 OCT, Retina - OU  - Both Eyes    Intravitreal Injection, Pharmacologic Agent - OD - Right Eye    faricimab -svoa (VABYSMO ) 6mg /0.57mL intravitreal injection    2. Retinal artery branch occlusion of right eye  H34.231     3. 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 Long term (current) use of oral hypoglycemic drugs  Z79.84     5. Current use of insulin (HCC)  Z79.4     6. Essential hypertension  I10     7. Hypertensive retinopathy of both eyes  H35.033     8. Combined forms of age-related cataract of left eye  H25.812     9. 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 BRVO with CME OD  - delayed f/u -- 7 wks instead of 5 on 01.23.25  - lost to follow up to 16 wks instead of 9 (08.10.23-11.30.23)  - lost to follow up from 6 weeks to 8 weeks 912.28.23 - 02.22.24)  - s/p IVA OD #1 (10.05.20), #2 (11.02.20), #3 (11.30.20), #4 (01.07.21), #5 (02.05.21), #6 (03.05.21), #7 (04.02.21), #8 (05.04.21), #9 (06.03.21), #10 (07.22.21), #11 (08.19.21), #12 (09.16.21) -- IVA resistance  - s/p IVE OD #1 (10.14.21), #2 (11.11.21), #3 (12.09.21), #4 (01.13.22), #5 (2.10.22), #6 (04.05.22), #7 (05.26.22), #8 (07.21.22), #9 (09.29.22), #10 (12.01.22), #11 (02.02.23), #12 (04.06.23), #13 (06.08.23), #14 (08.10.23), #15 (11.30.23), #16 (12.28.23), #17 (02.22.24), #18 (04.02.24), #19 (05.16.24), #20 (06.27.24), #21 (08.13.24), #22 (09.19.24) -- IVE resistance  - s/p IVV OD #1 (10.29.24), #2 (12.05.24), #3 (01.23.25), #4 (02.27.25), #5 (04.10.25), #6 (05.29.25), #7 (07.10.25), #8 (08.21.25)  **history of increased peripapillary IRF at 5 wks, noted on 09.19.24 visit**  **history of increased IRF at 7 weeks, noted on 05.29.25 exam**  **history of increased IRF at 10 wk interval, noted on 9.29.22 visit** **history of increased IRF at 16 wks on 11.30.23 visit**  -  initial exam and OCT findings suggestive of a BRAO component contributing  - BCVA OD 20/25 decreased  from 20/20  - exam shows OD: Stable improvement in persistent focal IRF/IRHM superior macula, persistent IRF/IRHM SN macula (peripapillary--slightly improved), partial PVD, focal schisis cavity IT periphery --not imaged today; ND:Dujaoz improvement in shallow peripapillary SRF, no IRF, partial PVD, Shallow IT Retinoschisis -- not imaged today at 6 wks  - recommend IVV today OD #9 (10.02.25) for BRVO w/ CME with f/u in 6 weeks again  - pt wishes to proceed  - RBA of procedure discussed, questions answered  - Vabysmo  informed consent obtained and signed, 10.02.25  - see procedure note  - Eylea[employer]U benefits investigation started 9.16.21 -- approved for 2025  - Vabysmo  is approved for 2025  - F/U 6 weeks -- DFE/OCT/possible injection, widefield OCT through schisis  3-5. Moderate nonproliferative diabetic retinopathy OU  - A1c 6.7 on 05.14.25 - The incidence, risk factors for progression, natural history and treatment options for diabetic retinopathy were discussed with patient.   - The need for close monitoring of blood glucose, blood pressure, and serum lipids, avoiding cigarette or any type of tobacco, and the need for long term follow up was also discussed with patient. - exam shows scattered MA and punctate exudates OU - recommend IVV OD #9 as above - discussed possible DR component contributing to edema OD and possible benefit of from Vabysmo  to IVE HD OD - approved for IVE HD, but will owe 20% due to no Good Days - f/u 6 wks -- DFE/OCT, possible injection(s)  6,7. Hypertensive retinopathy OU  - pt not formally diagnosed with HTN  - discussed importance of tight BP control and its relation to problems #1-2 above  - advised discussion with PCP and possible calibration of home BP cuff with PCP's  - monitor  8. Mixed form age related cataract OS  - The symptoms of cataract, surgical options,  and treatments and risks were discussed with patient.  - discussed diagnosis and progression  - under the expert management of Dr. Fleeta  9. Pseudophakia OD  - s/p CE/IOL (Dr. Fleeta, 10.18.23)  - IOL in good position  - post - op CME + BRVO w/ CME   - monitor  10. 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  There are no Patient Instructions on file for this visit.   Explained the diagnoses, plan, and follow up with the patient and they expressed understanding.  Patient expressed understanding of the importance of proper follow up care.   This document serves as a record of services personally performed by Redell JUDITHANN Hans, MD, PhD. It was created on their behalf by Auston Muzzy, COMT. The creation of this record is the provider's dictation and/or activities during the visit.  Electronically signed by: Auston Muzzy, COMT 02/26/24 11:42 AM  This document serves as a record of services personally performed by Redell JUDITHANN Hans, MD, PhD. It was created on their behalf by Almetta Pesa, an ophthalmic technician. The creation of this record is the provider's dictation and/or activities during the visit.  Electronically signed by: Almetta Pesa, OA, 02/26/24  11:42 AM  Redell JUDITHANN Hans, M.D., Ph.D. Diseases & Surgery of the Retina and Vitreous [facility]  I have reviewed the above documentation for accuracy and completeness, and I agree with the above. Redell JUDITHANN Hans, M.D., Ph.D. 02/26/24 11:43 AM    Abbreviations: M myopia (nearsighted); A astigmatism; H hyperopia (farsighted); P presbyopia; Mrx spectacle prescription;  CTL contact lenses; OD right eye; OS left eye; OU both eyes  XT exotropia; ET esotropia; PEK punctate epithelial keratitis; PEE punctate epithelial erosions; DES dry eye syndrome; MGD meibomian gland dysfunction; ATs artificial tears; PFAT's preservative free artificial tears; NSC nuclear sclerotic cataract; PSC posterior subcapsular cataract; ERM epi-retinal membrane; PVD posterior vitreous detachment; RD retinal detachment; DM diabetes mellitus; DR diabetic retinopathy; NPDR non-proliferative diabetic retinopathy; PDR proliferative diabetic retinopathy; CSME clinically significant macular edema; DME diabetic macular edema; dbh dot blot hemorrhages; CWS cotton wool spot; POAG primary open angle glaucoma; C/D cup-to-disc ratio; HVF humphrey visual field; GVF goldmann visual field; OCT optical coherence tomography; IOP intraocular pressure; BRVO Branch retinal vein occlusion; CRVO central retinal vein occlusion; CRAO central retinal artery occlusion; BRAO branch retinal artery occlusion; RT retinal tear; SB scleral buckle; PPV pars plana vitrectomy; VH Vitreous hemorrhage; PRP panretinal laser photocoagulation; IVK intravitreal kenalog; VMT vitreomacular traction; MH Macular hole;  NVD neovascularization of the disc; NVE neovascularization elsewhere; AREDS age related eye disease study; ARMD age related macular degeneration; POAG primary open angle glaucoma; EBMD epithelial/anterior basement membrane dystrophy; ACIOL anterior chamber intraocular lens; IOL intraocular lens; PCIOL posterior chamber intraocular lens; Phaco/IOL phacoemulsification with intraocular lens placement; PRK photorefractive keratectomy; LASIK laser assisted in situ keratomileusis; HTN hypertension; DM diabetes mellitus; COPD chronic obstructive pulmonary disease

## 2024-02-26 ENCOUNTER — Encounter (INDEPENDENT_AMBULATORY_CARE_PROVIDER_SITE_OTHER): Payer: Self-pay | Admitting: Ophthalmology

## 2024-02-26 ENCOUNTER — Ambulatory Visit (INDEPENDENT_AMBULATORY_CARE_PROVIDER_SITE_OTHER): Admitting: Ophthalmology

## 2024-02-26 DIAGNOSIS — H34831 Tributary (branch) retinal vein occlusion, right eye, with macular edema: Secondary | ICD-10-CM

## 2024-02-26 DIAGNOSIS — H35033 Hypertensive retinopathy, bilateral: Secondary | ICD-10-CM

## 2024-02-26 DIAGNOSIS — E113393 Type 2 diabetes mellitus with moderate nonproliferative diabetic retinopathy without macular edema, bilateral: Secondary | ICD-10-CM

## 2024-02-26 DIAGNOSIS — H209 Unspecified iridocyclitis: Secondary | ICD-10-CM

## 2024-02-26 DIAGNOSIS — Z7984 Long term (current) use of oral hypoglycemic drugs: Secondary | ICD-10-CM | POA: Diagnosis not present

## 2024-02-26 DIAGNOSIS — H34231 Retinal artery branch occlusion, right eye: Secondary | ICD-10-CM | POA: Diagnosis not present

## 2024-02-26 DIAGNOSIS — H25812 Combined forms of age-related cataract, left eye: Secondary | ICD-10-CM

## 2024-02-26 DIAGNOSIS — I10 Essential (primary) hypertension: Secondary | ICD-10-CM

## 2024-02-26 DIAGNOSIS — Z794 Long term (current) use of insulin: Secondary | ICD-10-CM

## 2024-02-26 DIAGNOSIS — Z961 Presence of intraocular lens: Secondary | ICD-10-CM

## 2024-02-26 DIAGNOSIS — H33103 Unspecified retinoschisis, bilateral: Secondary | ICD-10-CM

## 2024-02-26 MED ORDER — FARICIMAB-SVOA 6 MG/0.05ML IZ SOSY
6.0000 mg | PREFILLED_SYRINGE | INTRAVITREAL | Status: AC | PRN
  Administered 2024-02-26: 6 mg via INTRAVITREAL

## 2024-04-06 NOTE — Progress Notes (Signed)
 [facility] - Clinic Note  04/08/2024     CHIEF COMPLAINT Patient presents for Retina Follow Up   HISTORY OF PRESENT ILLNESS: Jacqueline Greer is a 63 y.o. female who presents to the clinic today for:   HPI     Retina Follow Up   Patient presents with  CRVO/BRVO.  In right eye.  This started 6 weeks ago.  I, the attending physician,  performed the HPI with the patient and updated documentation appropriately.        Comments   Patient here for 6 weeks retina follow up for BRVO OD.Patient states vision doing better. No eye pain.       Last edited by Valdemar Rogue, MD on 04/08/2024 10:45 AM.     Pt states VA is doing well, feeling well.   Referring physician: Asuncion Setter, PA-C [address] [place] ,  [place] [address]  HISTORICAL INFORMATION:   Selected notes from the MEDICAL RECORD NUMBER Referred by Dr. Demarco for concern of BRVO   CURRENT MEDICATIONS: Current Outpatient Medications (Ophthalmic Drugs)  Medication Sig   prednisoLONE  acetate (PRED FORTE ) 1 % ophthalmic suspension Place 1 drop into both eyes 4 (four) times daily.   No current facility-administered medications for this visit. (Ophthalmic Drugs)   Current Outpatient Medications (Other)  Medication Sig   insulin degludec (TRESIBA FLEXTOUCH) 100 UNIT/ML FlexTouch Pen Inject 6 Units into the skin daily.   metformin (FORTAMET) 500 MG (OSM) 24 hr tablet Take 500 tablets by mouth 1 day or 1 dose. One table before dinner.   MOUNJARO 2.5 MG/0.5ML Pen SMARTSIG:2.5 Milligram(s) SUB-Q Once a Week   omeprazole (PRILOSEC) 20 MG capsule Take 20 mg by mouth daily.   TOUJEO SOLOSTAR 300 UNIT/ML Solostar Pen INJECT 6 UNITS ONCE DAILY SUBCUTANEOUSLY (OVER 90 DAYS)   No current facility-administered medications for this visit. (Other)   REVIEW OF SYSTEMS: ROS   Positive for: Endocrine, Eyes Negative for: Constitutional, Gastrointestinal, Neurological, Skin, Genitourinary,  Musculoskeletal, HENT, Cardiovascular, Respiratory, Psychiatric, Allergic/Imm, Heme/Lymph Last edited by Orval Asberry RAMAN, COA on 04/08/2024  8:10 AM.     ALLERGIES No Known Allergies  PAST MEDICAL HISTORY Past Medical History:  Diagnosis Date   Cataract    OU   Diabetes mellitus without complication (HCC)    Hypertensive retinopathy    OU   Past Surgical History:  Procedure Laterality Date   LAPAROSCOPIC TUBAL LIGATION  2003   FAMILY HISTORY Family History  Problem Relation Age of Onset   Diabetes Mother    Kidney failure Father    SOCIAL HISTORY Social History   Tobacco Use   Smoking status: Never   Smokeless tobacco: Never  Vaping Use   Vaping status: Never Used  Substance Use Topics   Alcohol use: No   Drug use: No       OPHTHALMIC EXAM:  Base Eye Exam     Visual Acuity (Snellen - Linear)       Right Left   Dist cc 20/20 20/20    Correction: Glasses         Tonometry (Tonopen, 8:09 AM)       Right Left   Pressure 15 18         Pupils       Dark Light Shape React APD   Right 3 2 Round Brisk None   Left 3 2 Round Brisk None         Visual Fields (Counting fingers)  Left Right    Full Full         Extraocular Movement       Right Left    Full, Ortho Full, Ortho         Neuro/Psych     Oriented x3: Yes   Mood/Affect: Normal         Dilation     Both eyes: 1.0% Mydriacyl, 2.5% Phenylephrine @ 8:09 AM           Slit Lamp and Fundus Exam     Slit Lamp Exam       Right Left   Lids/Lashes Dermatochalasis - upper lid Normal   Conjunctiva/Sclera Nasal and temporal Pinguecula, Melanosis Nasal and Pinguecula, Melanosis   Cornea well healed cataract wound, tear film debris, 2+ Punctate epithelial erosions, trace fine KP trace Punctate epithelial erosions, trace tear film debris, trace fine KP   Anterior Chamber deep and clear, no cell or flare deep and clear, no cell or flare   Iris Round and dilated, No NVI  Round and reactive   Lens PC IOL in good position, trace Posterior capsular opacification 2-3+ Nuclear sclerosis, 2-3+ Cortical cataract   Anterior Vitreous mild Vitreous syneresis, Posterior vitreous detachment mild Vitreous syneresis         Fundus Exam       Right Left   Disc 2+Pallor, +PPP Pink and Sharp, temporal PPP, no heme   C/D Ratio 0.5 0.5   Macula good foveal reflex, interval improvement in central edema, focal edema with punctate exudates SN macula--improved, temporal macula stably improved Flat, Good foveal reflex, RPE mottling and clumping, No heme or edema, geographic area of hypopigmented RPE nasal and superior to fovea -- stable from prior, focal shallow SRF nasal macula (peripapillary)--stably improved, no heme   Vessels attenuated, Tortuous, severe attenuation/sclerosis ST arcades attenuated, Tortuous   Periphery Attached, DBH temporal periphery -- extension of BRVO, bullous schisis cavity IT periphery (from 0700-0900) - stable; focal pigmented CR scar at 0130 midzone, no RT/RD, WWOP temporally, pigmented cystoid degeneration inferiorly, scattered MA and punctate exudates Attached, no heme, WWP temporal and inferior quadrants, shallow schisis IT periphery           Refraction     Wearing Rx       Sphere Cylinder Axis Add   Right -0.50 +1.00 152 +2.25   Left -4.50 +0.75 019 +2.25    Type: Progressive           IMAGING AND PROCEDURES  Imaging and Procedures for @TODAY @  OCT, Retina - OU - Both Eyes       Right Eye Quality was good. Central Foveal Thickness: 232. Progression has been stable. Findings include normal foveal contour, no SRF, intraretinal hyper-reflective material, intraretinal fluid (Stable improvement in persistent focal IRF/IRHM superior macula, persistent IRF/IRHM SN macula (peripapillary), partial PVD, focal schisis cavity IT periphery --not imaged today).   Left Eye Quality was good. Central Foveal Thickness: 261. Progression has been  stable. Findings include normal foveal contour, no IRF, subretinal fluid (Stable improvement in shallow peripapillary SRF, no IRF, partial PVD, Shallow IT Retinoschisis -- not imaged today).   Notes *Images captured and stored on drive  Diagnosis / Impression:  OD: Stable improvement in persistent focal IRF/IRHM superior macula, persistent IRF/IRHM SN macula (peripapillary), partial PVD, focal schisis cavity IT periphery --not imaged today OS: Stable improvement in shallow peripapillary SRF, no IRF, partial PVD, Shallow IT Retinoschisis -- not imaged today  Clinical management:  See below  Abbreviations: NFP - Normal foveal profile. CME - cystoid macular edema. PED - pigment epithelial detachment. IRF - intraretinal fluid. SRF - subretinal fluid. EZ - ellipsoid zone. ERM - epiretinal membrane. ORA - outer retinal atrophy. ORT - outer retinal tubulation. SRHM - subretinal hyper-reflective material      Intravitreal Injection, Pharmacologic Agent - OD - Right Eye       Time Out 04/08/2024. 8:11 AM. Confirmed correct patient, procedure, site, and patient consented.   Anesthesia Topical anesthesia was used. Anesthetic medications included Lidocaine 2%, Proparacaine 0.5%.   Procedure Preparation included 5% betadine to ocular surface, eyelid speculum. A supplied (32g) needle was used.   Injection: 6 mg faricimab -svoa 6 MG/0.05ML   Route: Intravitreal, Site: Right Eye   NDC: 49757-903-93, Lot: [id number], Expiration date: 02/23/2025, Waste: 0 mL   Post-op Post injection exam found visual acuity of at least counting fingers. The patient tolerated the procedure well. There were no complications. The patient received written and verbal post procedure care education. Post injection medications were not given.             ASSESSMENT/PLAN:   ICD-10-CM   1. Branch retinal vein occlusion of right eye with macular edema (HCC)  H34.8310 OCT, Retina - OU - Both Eyes    Intravitreal  Injection, Pharmacologic Agent - OD - Right Eye    faricimab -svoa (VABYSMO ) 6mg /0.41mL intravitreal injection    2. Retinal artery branch occlusion of right eye  H34.231     3. 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 Long term (current) use of oral hypoglycemic drugs  Z79.84     5. Current use of insulin (HCC)  Z79.4     6. Essential hypertension  I10     7. Hypertensive retinopathy of both eyes  H35.033     8. Combined forms of age-related cataract of left eye  H25.812     9. 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 BRVO with CME OD  - delayed f/u -- 7 wks instead of 5 on 01.23.25  - lost to follow up to 16 wks instead of 9 (08.10.23-11.30.23)  - lost to follow up from 6 weeks to 8 weeks 912.28.23 - 02.22.24)  - s/p IVA OD #1 (10.05.20), #2 (11.02.20), #3 (11.30.20), #4 (01.07.21), #5 (02.05.21), #6 (03.05.21), #7 (04.02.21), #8 (05.04.21), #9 (06.03.21), #10 (07.22.21), #11 (08.19.21), #12 (09.16.21) -- IVA resistance  - s/p IVE OD #1 (10.14.21), #2 (11.11.21), #3 (12.09.21), #4 (01.13.22), #5 (2.10.22), #6 (04.05.22), #7 (05.26.22), #8 (07.21.22), #9 (09.29.22), #10 (12.01.22), #11 (02.02.23), #12 (04.06.23), #13 (06.08.23), #14 (08.10.23), #15 (11.30.23), #16 (12.28.23), #17 (02.22.24), #18 (04.02.24), #19 (05.16.24), #20 (06.27.24), #21 (08.13.24), #22 (09.19.24) -- IVE resistance  - s/p IVV OD #1 (10.29.24), #2 (12.05.24), #3 (01.23.25), #4 (02.27.25), #5 (04.10.25), #6 (05.29.25), #7 (07.10.25), #8 (08.21.25), #9 (10.02.25)  **history of increased peripapillary IRF at 5 wks, noted on 09.19.24 visit**  **history of increased IRF at 7 weeks, noted on 05.29.25 exam**  **history of increased IRF at 10 wk interval, noted on 9.29.22 visit** **history of increased IRF at 16 wks on 11.30.23 visit**  - initial exam and OCT findings suggestive of a BRAO  component contributing  - BCVA OD 20/25 decreased from 20/20  - exam shows OD: Stable improvement in persistent focal IRF/IRHM superior macula, persistent IRF/IRHM SN macula (  peripapillary--slightly improved), partial PVD, focal schisis cavity IT periphery --not imaged today; ND:Dujaoz improvement in shallow peripapillary SRF, no IRF, partial PVD, Shallow IT Retinoschisis -- not imaged today at 6 wks  - recommend IVV today OD #10 (11.13.25) for BRVO w/ CME with f/u ext to 7-8 weeks due to holiday per pt preference  - pt wishes to proceed  - RBA of procedure discussed, questions answered  - Vabysmo  informed consent obtained and signed, 10.02.25  - see procedure note  - [employer] benefits investigation started 9.16.21 -- approved for 2025  - Vabysmo  is approved for 2025  - F/U 7-8 weeks -- DFE/OCT/possible injection, widefield OCT OU through schisis  3-5. Moderate nonproliferative diabetic retinopathy OU  - A1c 6.7 on 05.14.25 - The incidence, risk factors for progression, natural history and treatment options for diabetic retinopathy were discussed with patient.   - The need for close monitoring of blood glucose, blood pressure, and serum lipids, avoiding cigarette or any type of tobacco, and the need for long term follow up was also discussed with patient. - exam shows scattered MA and punctate exudates OU - recommend IVV OD #10 as above - discussed possible DR component contributing to edema OD and possible benefit of from Vabysmo  to IVE HD OD - approved for IVE HD, but will owe 20% due to no Good Days - f/u 7-8 wks -- DFE/OCT, possible injection(s)  6,7. Hypertensive retinopathy OU  - pt not formally diagnosed with HTN  - discussed importance of tight BP control and its relation to problems #1-2 above  - advised discussion with PCP and possible calibration of home BP cuff with PCP's  - monitor  8. Mixed form age related cataract OS  - The symptoms of cataract, surgical options, and  treatments and risks were discussed with patient.  - discussed diagnosis and progression  - under the expert management of Dr. Fleeta  9. Pseudophakia OD  - s/p CE/IOL (Dr. Fleeta, 10.18.23)  - IOL in good position  - post - op CME + BRVO w/ CME   - monitor  10. Retinoschisis OU  - shallow schisis inferotemporal periphery OU -- stable  - stable on widefield OCT  - no RT/RD on scleral depression  - pt asymptomatic  - monitor    Ophthalmic Meds Ordered this visit:  Meds ordered this encounter  Medications   faricimab -svoa (VABYSMO ) 6mg /0.25mL intravitreal injection     Return for 7-8 weeks BRVO OD, DFE, OCT w/ widefield through OU schisis, Possible Injxn.  There are no Patient Instructions on file for this visit.   Explained the diagnoses, plan, and follow up with the patient and they expressed understanding.  Patient expressed understanding of the importance of proper follow up care.   This document serves as a record of services personally performed by Redell JUDITHANN Hans, MD, PhD. It was created on their behalf by Auston Muzzy, COMT. The creation of this record is the provider's dictation and/or activities during the visit.  Electronically signed by: Auston Muzzy, COMT 04/08/24 10:50 AM  This document serves as a record of services personally performed by Redell JUDITHANN Hans, MD, PhD. It was created on their behalf by Almetta Pesa, an ophthalmic technician. The creation of this record is the provider's dictation and/or activities during the visit.    Electronically signed by: Almetta Pesa, OA, 04/08/24  10:50 AM   Redell JUDITHANN Hans, M.D., Ph.D. Diseases & Surgery of the Retina and Vitreous [facility]  I have reviewed the above documentation for accuracy and completeness, and I agree with the above. Redell JUDITHANN Hans, M.D., Ph.D. 04/08/24 10:50 AM    Abbreviations: M myopia (nearsighted); A astigmatism; H hyperopia (farsighted); P presbyopia; Mrx  spectacle prescription;  CTL contact lenses; OD right eye; OS left eye; OU both eyes  XT exotropia; ET esotropia; PEK punctate epithelial keratitis; PEE punctate epithelial erosions; DES dry eye syndrome; MGD meibomian gland dysfunction; ATs artificial tears; PFAT's preservative free artificial tears; NSC nuclear sclerotic cataract; PSC posterior subcapsular cataract; ERM epi-retinal membrane; PVD posterior vitreous detachment; RD retinal detachment; DM diabetes mellitus; DR diabetic retinopathy; NPDR non-proliferative diabetic retinopathy; PDR proliferative diabetic retinopathy; CSME clinically significant macular edema; DME diabetic macular edema; dbh dot blot hemorrhages; CWS cotton wool spot; POAG primary open angle glaucoma; C/D cup-to-disc ratio; HVF humphrey visual field; GVF goldmann visual field; OCT optical coherence tomography; IOP intraocular pressure; BRVO Branch retinal vein occlusion; CRVO central retinal vein occlusion; CRAO central retinal artery occlusion; BRAO branch retinal artery occlusion; RT retinal tear; SB scleral buckle; PPV pars plana vitrectomy; VH Vitreous hemorrhage; PRP panretinal laser photocoagulation; IVK intravitreal kenalog; VMT vitreomacular traction; MH Macular hole;  NVD neovascularization of the disc; NVE neovascularization elsewhere; AREDS age related eye disease study; ARMD age related macular degeneration; POAG primary open angle glaucoma; EBMD epithelial/anterior basement membrane dystrophy; ACIOL anterior chamber intraocular lens; IOL intraocular lens; PCIOL posterior chamber intraocular lens; Phaco/IOL phacoemulsification with intraocular lens placement; PRK photorefractive keratectomy; LASIK laser assisted in situ keratomileusis; HTN hypertension; DM diabetes mellitus; COPD chronic obstructive pulmonary disease

## 2024-04-08 ENCOUNTER — Ambulatory Visit (INDEPENDENT_AMBULATORY_CARE_PROVIDER_SITE_OTHER): Admitting: Ophthalmology

## 2024-04-08 ENCOUNTER — Encounter (INDEPENDENT_AMBULATORY_CARE_PROVIDER_SITE_OTHER): Payer: Self-pay | Admitting: Ophthalmology

## 2024-04-08 DIAGNOSIS — I10 Essential (primary) hypertension: Secondary | ICD-10-CM

## 2024-04-08 DIAGNOSIS — H33103 Unspecified retinoschisis, bilateral: Secondary | ICD-10-CM

## 2024-04-08 DIAGNOSIS — H25812 Combined forms of age-related cataract, left eye: Secondary | ICD-10-CM

## 2024-04-08 DIAGNOSIS — H35033 Hypertensive retinopathy, bilateral: Secondary | ICD-10-CM

## 2024-04-08 DIAGNOSIS — E113393 Type 2 diabetes mellitus with moderate nonproliferative diabetic retinopathy without macular edema, bilateral: Secondary | ICD-10-CM

## 2024-04-08 DIAGNOSIS — Z7984 Long term (current) use of oral hypoglycemic drugs: Secondary | ICD-10-CM | POA: Diagnosis not present

## 2024-04-08 DIAGNOSIS — H34231 Retinal artery branch occlusion, right eye: Secondary | ICD-10-CM | POA: Diagnosis not present

## 2024-04-08 DIAGNOSIS — H34831 Tributary (branch) retinal vein occlusion, right eye, with macular edema: Secondary | ICD-10-CM | POA: Diagnosis not present

## 2024-04-08 DIAGNOSIS — Z961 Presence of intraocular lens: Secondary | ICD-10-CM

## 2024-04-08 DIAGNOSIS — Z794 Long term (current) use of insulin: Secondary | ICD-10-CM

## 2024-04-08 DIAGNOSIS — H209 Unspecified iridocyclitis: Secondary | ICD-10-CM

## 2024-04-08 MED ORDER — FARICIMAB-SVOA 6 MG/0.05ML IZ SOSY
6.0000 mg | PREFILLED_SYRINGE | INTRAVITREAL | Status: AC | PRN
  Administered 2024-04-08: 6 mg via INTRAVITREAL

## 2024-05-24 NOTE — Progress Notes (Signed)
 " [facility] - Clinic Note  06/03/2024     CHIEF COMPLAINT Patient presents for Retina Follow Up   HISTORY OF PRESENT ILLNESS: Jacqueline Greer is a 63 y.o. female who presents to the clinic today for:   HPI     Retina Follow Up   Patient presents with  CRVO/BRVO.  In right eye.  Severity is mild.  Duration of 8 weeks.  Since onset it is stable.  I, the attending physician,  performed the HPI with the patient and updated documentation appropriately.        Comments   8 weeks Retina eval. Patient states vision is doing pretty good      Last edited by Valdemar Rogue, MD on 06/03/2024  2:26 PM.      Pt states VA is doing well, no changes noted.  Referring physician: Asuncion Setter, PA-C [address] [place] ,  [place] [address]  HISTORICAL INFORMATION:   Selected notes from the MEDICAL RECORD NUMBER Referred by Dr. Demarco for concern of BRVO   CURRENT MEDICATIONS: Current Outpatient Medications (Ophthalmic Drugs)  Medication Sig   prednisoLONE  acetate (PRED FORTE ) 1 % ophthalmic suspension Place 1 drop into both eyes 4 (four) times daily.   No current facility-administered medications for this visit. (Ophthalmic Drugs)   Current Outpatient Medications (Other)  Medication Sig   insulin degludec (TRESIBA FLEXTOUCH) 100 UNIT/ML FlexTouch Pen Inject 6 Units into the skin daily.   omeprazole (PRILOSEC) 20 MG capsule Take 20 mg by mouth daily.   metformin (FORTAMET) 500 MG (OSM) 24 hr tablet Take 500 tablets by mouth 1 day or 1 dose. One table before dinner. (Patient not taking: Reported on 06/03/2024)   MOUNJARO 2.5 MG/0.5ML Pen SMARTSIG:2.5 Milligram(s) SUB-Q Once a Week (Patient not taking: Reported on 06/03/2024)   TOUJEO SOLOSTAR 300 UNIT/ML Solostar Pen INJECT 6 UNITS ONCE DAILY SUBCUTANEOUSLY (OVER 90 DAYS) (Patient not taking: Reported on 06/03/2024)   No current facility-administered medications for this visit. (Other)   REVIEW OF  SYSTEMS: ROS   Positive for: Endocrine, Eyes Negative for: Constitutional, Gastrointestinal, Neurological, Skin, Genitourinary, Musculoskeletal, HENT, Cardiovascular, Respiratory, Psychiatric, Allergic/Imm, Heme/Lymph Last edited by German Olam BRAVO, COT on 06/03/2024  8:53 AM.      ALLERGIES No Known Allergies  PAST MEDICAL HISTORY Past Medical History:  Diagnosis Date   Cataract    OU   Diabetes mellitus without complication (HCC)    Hypertensive retinopathy    OU   Past Surgical History:  Procedure Laterality Date   LAPAROSCOPIC TUBAL LIGATION  2003   FAMILY HISTORY Family History  Problem Relation Age of Onset   Diabetes Mother    Kidney failure Father    SOCIAL HISTORY Social History   Tobacco Use   Smoking status: Never   Smokeless tobacco: Never  Vaping Use   Vaping status: Never Used  Substance Use Topics   Alcohol use: No   Drug use: No       OPHTHALMIC EXAM:  Base Eye Exam     Visual Acuity (Snellen - Linear)       Right Left   Dist cc 20/20 20/20 -2    Correction: Glasses         Tonometry (Tonopen, 8:54 AM)       Right Left   Pressure 15 14         Pupils       Dark Light Shape React APD   Right 3 2 Round  Brisk None   Left 3 2 Round Brisk None         Visual Fields (Counting fingers)       Left Right    Full Full         Extraocular Movement       Right Left    Full, Ortho Full, Ortho         Neuro/Psych     Oriented x3: Yes   Mood/Affect: Normal         Dilation     Both eyes: 1.0% Mydriacyl, 2.5% Phenylephrine @ 8:54 AM           Slit Lamp and Fundus Exam     Slit Lamp Exam       Right Left   Lids/Lashes Dermatochalasis - upper lid Normal   Conjunctiva/Sclera Nasal and temporal Pinguecula, Melanosis Nasal and Pinguecula, Melanosis   Cornea well healed cataract wound, tear film debris, 2+ Punctate epithelial erosions, trace fine KP trace Punctate epithelial erosions, trace tear film debris,  trace fine KP   Anterior Chamber deep and clear, no cell or flare deep and clear, no cell or flare   Iris Round and dilated, No NVI Round and reactive   Lens PC IOL in good position, trace Posterior capsular opacification 2-3+ Nuclear sclerosis, 2-3+ Cortical cataract   Anterior Vitreous mild Vitreous syneresis, Posterior vitreous detachment mild Vitreous syneresis         Fundus Exam       Right Left   Disc 2+Pallor, +PPP Pink and Sharp, temporal PPP, no heme   C/D Ratio 0.5 0.5   Macula good foveal reflex, stable improvement in central edema, focal edema with punctate exudates SN macula--improved, temporal macula stably improved Flat, Good foveal reflex, RPE mottling and clumping, trace focal central cyst, geographic area of hypopigmented RPE nasal and superior to fovea -- stable from prior, focal shallow SRF nasal macula (peripapillary)--stably improved, no heme   Vessels attenuated, Tortuous, severe attenuation/sclerosis ST arcades attenuated, Tortuous   Periphery Attached, DBH temporal periphery -- extension of BRVO, bullous schisis cavity IT periphery (from 0700-0900) - stable; focal pigmented CR scar at 0130 midzone, no RT/RD, WWOP temporally, pigmented cystoid degeneration inferiorly, scattered MA and punctate exudates Attached, no heme, WWP temporal and inferior quadrants, shallow schisis IT periphery           Refraction     Wearing Rx       Sphere Cylinder Axis Add   Right -0.50 +1.00 152 +2.25   Left -4.50 +0.75 019 +2.25    Type: Progressive           IMAGING AND PROCEDURES  Imaging and Procedures for @TODAY @  OCT, Retina - OU - Both Eyes       Right Eye Quality was good. Central Foveal Thickness: 235. Progression has been stable. Findings include normal foveal contour, no SRF, intraretinal hyper-reflective material, intraretinal fluid (Stable improvement in focal IRF/IRHM superior macula--just trace cystic changes present, persistent IRF/IRHM SN macula  (peripapillary)--slightly improved, partial PVD, focal schisis cavity IT periphery --not imaged today).   Left Eye Quality was good. Central Foveal Thickness: 277. Progression has worsened. Findings include normal foveal contour, intraretinal fluid, subretinal fluid (Stable improvement in shallow peripapillary SRF, interval development of cystic changes IN fovea, partial PVD, Shallow IT Retinoschisis -- not imaged today).   Notes *Images captured and stored on drive  Diagnosis / Impression:  OD: Stable improvement in focal IRF/IRHM superior macula--just trace cystic changes present,  persistent IRF/IRHM SN macula (peripapillary)--slightly improved, partial PVD, focal schisis cavity IT periphery --not imaged today OS: Stable improvement in shallow peripapillary SRF, interval development of cystic changes IN fovea, partial PVD, Shallow IT Retinoschisis -- not imaged today  Clinical management:  See below  Abbreviations: NFP - Normal foveal profile. CME - cystoid macular edema. PED - pigment epithelial detachment. IRF - intraretinal fluid. SRF - subretinal fluid. EZ - ellipsoid zone. ERM - epiretinal membrane. ORA - outer retinal atrophy. ORT - outer retinal tubulation. SRHM - subretinal hyper-reflective material      Intravitreal Injection, Pharmacologic Agent - OD - Right Eye       Time Out 06/03/2024. 10:02 AM. Confirmed correct patient, procedure, site, and patient consented.   Anesthesia Topical anesthesia was used. Anesthetic medications included Lidocaine 2%, Proparacaine 0.5%.   Procedure Preparation included 5% betadine to ocular surface, eyelid speculum. A supplied (32g) needle was used.   Injection: 6 mg faricimab -svoa 6 MG/0.05ML   Route: Intravitreal, Site: Right Eye   NDC: 49757-903-93, Lot: [id number], Expiration date: 05/26/2025, Waste: 0 mL   Post-op Post injection exam found visual acuity of at least counting fingers. The patient tolerated the procedure well. There  were no complications. The patient received written and verbal post procedure care education. Post injection medications were not given.              ASSESSMENT/PLAN:   ICD-10-CM   1. Branch retinal vein occlusion of right eye with macular edema (HCC)  H34.8310 OCT, Retina - OU - Both Eyes    Intravitreal Injection, Pharmacologic Agent - OD - Right Eye    faricimab -svoa (VABYSMO ) 6mg /0.23mL intravitreal injection    2. Retinal artery branch occlusion of right eye  H34.231     3. 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 Long term (current) use of oral hypoglycemic drugs  Z79.84     5. Current use of insulin (HCC)  Z79.4     6. Essential hypertension  I10     7. Hypertensive retinopathy of both eyes  H35.033     8. Combined forms of age-related cataract of left eye  H25.812     9. 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 BRVO with CME OD  - delayed f/u -- 7 wks instead of 5 on 01.23.25  - lost to follow up to 16 wks instead of 9 (08.10.23-11.30.23)  - lost to follow up from 6 weeks to 8 weeks 912.28.23 - 02.22.24)  - s/p IVA OD #1 (10.05.20), #2 (11.02.20), #3 (11.30.20), #4 (01.07.21), #5 (02.05.21), #6 (03.05.21), #7 (04.02.21), #8 (05.04.21), #9 (06.03.21), #10 (07.22.21), #11 (08.19.21), #12 (09.16.21) -- IVA resistance  - s/p IVE OD #1 (10.14.21), #2 (11.11.21), #3 (12.09.21), #4 (01.13.22), #5 (2.10.22), #6 (04.05.22), #7 (05.26.22), #8 (07.21.22), #9 (09.29.22), #10 (12.01.22), #11 (02.02.23), #12 (04.06.23), #13 (06.08.23), #14 (08.10.23), #15 (11.30.23), #16 (12.28.23), #17 (02.22.24), #18 (04.02.24), #19 (05.16.24), #20 (06.27.24), #21 (08.13.24), #22 (09.19.24) -- IVE resistance  - s/p IVV OD #1 (10.29.24), #2 (12.05.24), #3 (01.23.25), #4 (02.27.25), #5 (04.10.25), #6 (05.29.25), #7 (07.10.25), #8 (08.21.25), #9 (10.02.25), #10  (11.13.25)   **history of increased peripapillary IRF at 5 wks, noted on 09.19.24 visit**  **history of increased IRF at 7 weeks, noted on 05.29.25 exam**  **history of increased IRF at 10 wk interval, noted  on 9.29.22 visit** **history of increased IRF at 16 wks on 11.30.23 visit**  - initial exam and OCT findings suggestive of a BRAO component contributing  - BCVA OD 20/20 - stsable  - OCT shows OD: Stable improvement in focal IRF/IRHM superior macula--just trace cystic changes present, persistent IRF/IRHM SN macula (peripapillary)--slightly improved, partial PVD, focal schisis cavity IT periphery --not imaged today; ND:Dujaoz improvement in shallow peripapillary SRF, interval development of cystic changes IN fovea, partial PVD, Shallow IT Retinoschisis -- not imaged today at 8 wks  - recommend IVV today OD #11 (01.08.26) for BRVO w/ CME with f/u in 8 weeks   - pt wishes to proceed  - RBA of procedure discussed, questions answered  - Vabysmo  informed consent obtained and signed, 10.02.25  - see procedure note  - Eylea4U benefits investigation started 9.16.21 -- approved for 2025  - Vabysmo  is approved for 2025  - F/U 8 weeks -- DFE/OCT/possible injection, widefield OCT OU through schisis  3-5. Moderate nonproliferative diabetic retinopathy OU  - A1c 5.9 patient reported, 6.7 on 05.14.25  - Pt only on Monjauro now - The incidence, risk factors for progression, natural history and treatment options for diabetic retinopathy were discussed with patient.   - The need for close monitoring of blood glucose, blood pressure, and serum lipids, avoiding cigarette or any type of tobacco, and the need for long term follow up was also discussed with patient. - exam shows scattered MA and punctate exudates OU - recommend IVV OD #11 as above - discussed possible DR component contributing to edema OD and possible benefit of from Vabysmo  to IVE HD OD - approved for IVE HD, but will owe 20% due to no Good  Days - f/u 8 wks -- DFE/OCT, possible injection(s)  6,7. Hypertensive retinopathy OU  - pt not formally diagnosed with HTN  - discussed importance of tight BP control and its relation to problems #1-2 above  - advised discussion with PCP and possible calibration of home BP cuff with PCP's  - monitor  8. Mixed form age related cataract OS  - The symptoms of cataract, surgical options, and treatments and risks were discussed with patient.  - discussed diagnosis and progression  - under the expert management of Dr. Fleeta  9. Pseudophakia OD  - s/p CE/IOL (Dr. Fleeta, 10.18.23)  - IOL in good position  - post - op CME + BRVO w/ CME   - monitor  10. Retinoschisis OU  - shallow schisis inferotemporal periphery OU -- stable  - stable on widefield OCT  - no RT/RD on scleral depression  - pt asymptomatic  - monitor    Ophthalmic Meds Ordered this visit:  Meds ordered this encounter  Medications   faricimab -svoa (VABYSMO ) 6mg /0.23mL intravitreal injection     Return in about 8 weeks (around 07/29/2024) for BRVO OD, DFE, OCT, likely IVV OD, poss IVA OS.  There are no Patient Instructions on file for this visit.   Explained the diagnoses, plan, and follow up with the patient and they expressed understanding.  Patient expressed understanding of the importance of proper follow up care.   This document serves as a record of services personally performed by Redell JUDITHANN Hans, MD, PhD. It was created on their behalf by Avelina Pereyra, COA an ophthalmic technician. The creation of this record is the provider's dictation and/or activities during the visit.   Electronically signed by: Avelina GORMAN Pereyra, COT  06/13/24  2:47 PM   This document serves as  a record of services personally performed by Redell JUDITHANN Hans, MD, PhD. It was created on their behalf by Almetta Pesa, an ophthalmic technician. The creation of this record is the provider's dictation and/or activities during the visit.     Electronically signed by: Almetta Pesa, OA, 06/13/24  2:47 PM  Redell JUDITHANN Hans, M.D., Ph.D. Diseases & Surgery of the Retina and Vitreous [facility]  I have reviewed the above documentation for accuracy and completeness, and I agree with the above. Redell JUDITHANN Hans, M.D., Ph.D. 06/13/24 2:49 PM   Abbreviations: M myopia (nearsighted); A astigmatism; H hyperopia (farsighted); P presbyopia; Mrx spectacle prescription;  CTL contact lenses; OD right eye; OS left eye; OU both eyes  XT exotropia; ET esotropia; PEK punctate epithelial keratitis; PEE punctate epithelial erosions; DES dry eye syndrome; MGD meibomian gland dysfunction; ATs artificial tears; PFAT's preservative free artificial tears; NSC nuclear sclerotic cataract; PSC posterior subcapsular cataract; ERM epi-retinal membrane; PVD posterior vitreous detachment; RD retinal detachment; DM diabetes mellitus; DR diabetic retinopathy; NPDR non-proliferative diabetic retinopathy; PDR proliferative diabetic retinopathy; CSME clinically significant macular edema; DME diabetic macular edema; dbh dot blot hemorrhages; CWS cotton wool spot; POAG primary open angle glaucoma; C/D cup-to-disc ratio; HVF humphrey visual field; GVF goldmann visual field; OCT optical coherence tomography; IOP intraocular pressure; BRVO Branch retinal vein occlusion; CRVO central retinal vein occlusion; CRAO central retinal artery occlusion; BRAO branch retinal artery occlusion; RT retinal tear; SB scleral buckle; PPV pars plana vitrectomy; VH Vitreous hemorrhage; PRP panretinal laser photocoagulation; IVK intravitreal kenalog; VMT vitreomacular traction; MH Macular hole;  NVD neovascularization of the disc; NVE neovascularization elsewhere; AREDS age related eye disease study; ARMD age related macular degeneration; POAG primary open angle glaucoma; EBMD epithelial/anterior basement membrane dystrophy; ACIOL anterior chamber intraocular lens; IOL  intraocular lens; PCIOL posterior chamber intraocular lens; Phaco/IOL phacoemulsification with intraocular lens placement; PRK photorefractive keratectomy; LASIK laser assisted in situ keratomileusis; HTN hypertension; DM diabetes mellitus; COPD chronic obstructive pulmonary disease "

## 2024-06-03 ENCOUNTER — Ambulatory Visit (INDEPENDENT_AMBULATORY_CARE_PROVIDER_SITE_OTHER): Admitting: Ophthalmology

## 2024-06-03 ENCOUNTER — Encounter (INDEPENDENT_AMBULATORY_CARE_PROVIDER_SITE_OTHER): Payer: Self-pay | Admitting: Ophthalmology

## 2024-06-03 DIAGNOSIS — E113393 Type 2 diabetes mellitus with moderate nonproliferative diabetic retinopathy without macular edema, bilateral: Secondary | ICD-10-CM

## 2024-06-03 DIAGNOSIS — H25812 Combined forms of age-related cataract, left eye: Secondary | ICD-10-CM | POA: Diagnosis not present

## 2024-06-03 DIAGNOSIS — Z794 Long term (current) use of insulin: Secondary | ICD-10-CM | POA: Diagnosis not present

## 2024-06-03 DIAGNOSIS — H35033 Hypertensive retinopathy, bilateral: Secondary | ICD-10-CM

## 2024-06-03 DIAGNOSIS — H33103 Unspecified retinoschisis, bilateral: Secondary | ICD-10-CM

## 2024-06-03 DIAGNOSIS — Z961 Presence of intraocular lens: Secondary | ICD-10-CM | POA: Diagnosis not present

## 2024-06-03 DIAGNOSIS — H34831 Tributary (branch) retinal vein occlusion, right eye, with macular edema: Secondary | ICD-10-CM

## 2024-06-03 DIAGNOSIS — Z7984 Long term (current) use of oral hypoglycemic drugs: Secondary | ICD-10-CM | POA: Diagnosis not present

## 2024-06-03 DIAGNOSIS — H34231 Retinal artery branch occlusion, right eye: Secondary | ICD-10-CM

## 2024-06-03 DIAGNOSIS — I10 Essential (primary) hypertension: Secondary | ICD-10-CM

## 2024-06-03 MED ORDER — FARICIMAB-SVOA 6 MG/0.05ML IZ SOSY
6.0000 mg | PREFILLED_SYRINGE | INTRAVITREAL | Status: AC | PRN
  Administered 2024-06-03: 6 mg via INTRAVITREAL

## 2024-07-29 ENCOUNTER — Encounter (INDEPENDENT_AMBULATORY_CARE_PROVIDER_SITE_OTHER): Admitting: Ophthalmology
